# Patient Record
Sex: Male | Born: 1935 | Race: White | Hispanic: No | Marital: Single | Smoking: Former smoker
Health system: Southern US, Community
[De-identification: ages and names within clinical notes are randomized; demographics above are authoritative.]

## PROBLEM LIST (undated history)

## (undated) DIAGNOSIS — I639 Cerebral infarction, unspecified: Secondary | ICD-10-CM

## (undated) DIAGNOSIS — M109 Gout, unspecified: Secondary | ICD-10-CM

## (undated) DIAGNOSIS — E785 Hyperlipidemia, unspecified: Secondary | ICD-10-CM

## (undated) DIAGNOSIS — N183 Chronic kidney disease, stage 3 unspecified: Secondary | ICD-10-CM

## (undated) DIAGNOSIS — R4189 Other symptoms and signs involving cognitive functions and awareness: Secondary | ICD-10-CM

## (undated) DIAGNOSIS — I10 Essential (primary) hypertension: Secondary | ICD-10-CM

## (undated) DIAGNOSIS — R0600 Dyspnea, unspecified: Secondary | ICD-10-CM

## (undated) DIAGNOSIS — I2510 Atherosclerotic heart disease of native coronary artery without angina pectoris: Secondary | ICD-10-CM

## (undated) DIAGNOSIS — N400 Enlarged prostate without lower urinary tract symptoms: Secondary | ICD-10-CM

## (undated) DIAGNOSIS — R0609 Other forms of dyspnea: Secondary | ICD-10-CM

## (undated) DIAGNOSIS — Z960 Presence of urogenital implants: Secondary | ICD-10-CM

## (undated) DIAGNOSIS — I4891 Unspecified atrial fibrillation: Secondary | ICD-10-CM

## (undated) DIAGNOSIS — I5032 Chronic diastolic (congestive) heart failure: Secondary | ICD-10-CM

## (undated) DIAGNOSIS — Z978 Presence of other specified devices: Secondary | ICD-10-CM

## (undated) HISTORY — DX: Atherosclerotic heart disease of native coronary artery without angina pectoris: I25.10

## (undated) HISTORY — DX: Other symptoms and signs involving cognitive functions and awareness: R41.89

## (undated) HISTORY — DX: Essential (primary) hypertension: I10

## (undated) HISTORY — PX: HEMORRHOID SURGERY: SHX153

## (undated) HISTORY — DX: Dyspnea, unspecified: R06.00

## (undated) HISTORY — DX: Hyperlipidemia, unspecified: E78.5

## (undated) HISTORY — DX: Other forms of dyspnea: R06.09

---

## 1962-02-08 HISTORY — PX: TONSILLECTOMY: SUR1361

## 1992-02-09 HISTORY — PX: APPENDECTOMY: SHX54

## 1993-02-08 HISTORY — PX: EXCISION MORTON'S NEUROMA: SHX5013

## 1998-01-28 ENCOUNTER — Encounter: Payer: Self-pay | Admitting: *Deleted

## 1998-01-28 ENCOUNTER — Encounter: Payer: Self-pay | Admitting: Internal Medicine

## 1998-01-28 ENCOUNTER — Inpatient Hospital Stay (HOSPITAL_COMMUNITY): Admission: EM | Admit: 1998-01-28 | Discharge: 1998-02-05 | Payer: Self-pay | Admitting: Internal Medicine

## 1998-01-29 ENCOUNTER — Encounter: Payer: Self-pay | Admitting: Internal Medicine

## 1998-02-03 ENCOUNTER — Encounter: Payer: Self-pay | Admitting: Internal Medicine

## 1998-05-02 ENCOUNTER — Ambulatory Visit (HOSPITAL_COMMUNITY): Admission: RE | Admit: 1998-05-02 | Discharge: 1998-05-02 | Payer: Self-pay | Admitting: Neurological Surgery

## 1998-05-02 ENCOUNTER — Encounter: Payer: Self-pay | Admitting: Neurological Surgery

## 2001-08-31 ENCOUNTER — Encounter: Payer: Self-pay | Admitting: Emergency Medicine

## 2001-08-31 ENCOUNTER — Emergency Department (HOSPITAL_COMMUNITY): Admission: EM | Admit: 2001-08-31 | Discharge: 2001-08-31 | Payer: Self-pay | Admitting: Emergency Medicine

## 2002-02-14 ENCOUNTER — Inpatient Hospital Stay (HOSPITAL_COMMUNITY): Admission: EM | Admit: 2002-02-14 | Discharge: 2002-02-15 | Payer: Self-pay | Admitting: Emergency Medicine

## 2002-02-15 ENCOUNTER — Encounter: Payer: Self-pay | Admitting: Cardiology

## 2002-02-15 ENCOUNTER — Encounter: Payer: Self-pay | Admitting: Emergency Medicine

## 2002-12-19 ENCOUNTER — Ambulatory Visit (HOSPITAL_COMMUNITY): Admission: RE | Admit: 2002-12-19 | Discharge: 2002-12-19 | Payer: Self-pay | Admitting: Gastroenterology

## 2004-10-02 ENCOUNTER — Ambulatory Visit: Payer: Self-pay | Admitting: Physical Medicine & Rehabilitation

## 2004-10-02 ENCOUNTER — Inpatient Hospital Stay (HOSPITAL_COMMUNITY): Admission: EM | Admit: 2004-10-02 | Discharge: 2004-10-07 | Payer: Self-pay | Admitting: Emergency Medicine

## 2004-10-05 ENCOUNTER — Encounter: Payer: Self-pay | Admitting: Cardiology

## 2004-10-07 ENCOUNTER — Inpatient Hospital Stay
Admission: RE | Admit: 2004-10-07 | Discharge: 2004-10-14 | Payer: Self-pay | Admitting: Physical Medicine & Rehabilitation

## 2004-10-10 ENCOUNTER — Encounter (HOSPITAL_COMMUNITY)
Admission: RE | Admit: 2004-10-10 | Discharge: 2004-10-13 | Payer: Self-pay | Admitting: Physical Medicine & Rehabilitation

## 2004-10-21 ENCOUNTER — Encounter
Admission: RE | Admit: 2004-10-21 | Discharge: 2005-01-19 | Payer: Self-pay | Admitting: Physical Medicine & Rehabilitation

## 2004-11-20 ENCOUNTER — Ambulatory Visit: Payer: Self-pay | Admitting: Physical Medicine & Rehabilitation

## 2004-11-20 ENCOUNTER — Encounter
Admission: RE | Admit: 2004-11-20 | Discharge: 2005-02-18 | Payer: Self-pay | Admitting: Physical Medicine & Rehabilitation

## 2005-02-18 ENCOUNTER — Ambulatory Visit: Payer: Self-pay | Admitting: Physical Medicine & Rehabilitation

## 2005-02-18 ENCOUNTER — Encounter
Admission: RE | Admit: 2005-02-18 | Discharge: 2005-05-19 | Payer: Self-pay | Admitting: Physical Medicine & Rehabilitation

## 2006-01-12 ENCOUNTER — Inpatient Hospital Stay (HOSPITAL_COMMUNITY): Admission: AD | Admit: 2006-01-12 | Discharge: 2006-01-15 | Payer: Self-pay | Admitting: Cardiology

## 2006-02-08 HISTORY — PX: CATARACT EXTRACTION: SUR2

## 2007-12-19 HISTORY — PX: US ECHOCARDIOGRAPHY: HXRAD669

## 2008-01-11 HISTORY — PX: CARDIOVASCULAR STRESS TEST: SHX262

## 2009-09-26 ENCOUNTER — Ambulatory Visit: Payer: Self-pay | Admitting: Cardiology

## 2009-10-27 ENCOUNTER — Ambulatory Visit: Payer: Self-pay | Admitting: Cardiovascular Disease

## 2009-11-20 ENCOUNTER — Ambulatory Visit: Payer: Self-pay | Admitting: Cardiology

## 2009-12-22 ENCOUNTER — Ambulatory Visit: Payer: Self-pay | Admitting: Cardiology

## 2010-01-20 ENCOUNTER — Ambulatory Visit: Payer: Self-pay | Admitting: Cardiology

## 2010-02-20 ENCOUNTER — Ambulatory Visit: Payer: Self-pay | Admitting: Cardiology

## 2010-03-23 ENCOUNTER — Encounter (INDEPENDENT_AMBULATORY_CARE_PROVIDER_SITE_OTHER): Payer: Medicare Other

## 2010-03-23 DIAGNOSIS — Z7901 Long term (current) use of anticoagulants: Secondary | ICD-10-CM

## 2010-03-23 DIAGNOSIS — I4891 Unspecified atrial fibrillation: Secondary | ICD-10-CM

## 2010-03-24 ENCOUNTER — Emergency Department (HOSPITAL_COMMUNITY): Payer: Medicare Other

## 2010-03-24 ENCOUNTER — Encounter (HOSPITAL_COMMUNITY): Payer: Self-pay | Admitting: Radiology

## 2010-03-24 ENCOUNTER — Observation Stay (HOSPITAL_COMMUNITY)
Admission: EM | Admit: 2010-03-24 | Discharge: 2010-03-25 | Disposition: A | Discharge status: Home / Self Care | Payer: Medicare Other | Attending: Internal Medicine | Admitting: Internal Medicine

## 2010-03-24 DIAGNOSIS — M79609 Pain in unspecified limb: Secondary | ICD-10-CM

## 2010-03-24 DIAGNOSIS — M1710 Unilateral primary osteoarthritis, unspecified knee: Secondary | ICD-10-CM | POA: Insufficient documentation

## 2010-03-24 DIAGNOSIS — Z7902 Long term (current) use of antithrombotics/antiplatelets: Secondary | ICD-10-CM | POA: Insufficient documentation

## 2010-03-24 DIAGNOSIS — I252 Old myocardial infarction: Secondary | ICD-10-CM | POA: Insufficient documentation

## 2010-03-24 DIAGNOSIS — H3400 Transient retinal artery occlusion, unspecified eye: Principal | ICD-10-CM | POA: Insufficient documentation

## 2010-03-24 DIAGNOSIS — I69922 Dysarthria following unspecified cerebrovascular disease: Secondary | ICD-10-CM | POA: Insufficient documentation

## 2010-03-24 DIAGNOSIS — Z7901 Long term (current) use of anticoagulants: Secondary | ICD-10-CM | POA: Insufficient documentation

## 2010-03-24 DIAGNOSIS — I519 Heart disease, unspecified: Secondary | ICD-10-CM | POA: Insufficient documentation

## 2010-03-24 DIAGNOSIS — Z79899 Other long term (current) drug therapy: Secondary | ICD-10-CM | POA: Insufficient documentation

## 2010-03-24 DIAGNOSIS — I4891 Unspecified atrial fibrillation: Secondary | ICD-10-CM | POA: Insufficient documentation

## 2010-03-24 DIAGNOSIS — Z7982 Long term (current) use of aspirin: Secondary | ICD-10-CM | POA: Insufficient documentation

## 2010-03-24 HISTORY — DX: Cerebral infarction, unspecified: I63.9

## 2010-03-24 HISTORY — DX: Unspecified atrial fibrillation: I48.91

## 2010-03-24 LAB — CBC
HCT: 44 % (ref 39.0–52.0)
Hemoglobin: 15.8 g/dL (ref 13.0–17.0)
MCHC: 35.9 g/dL (ref 30.0–36.0)
Platelets: 132 10*3/uL — ABNORMAL LOW (ref 150–400)
RDW: 13.5 % (ref 11.5–15.5)
WBC: 8 10*3/uL (ref 4.0–10.5)

## 2010-03-24 LAB — BASIC METABOLIC PANEL
BUN: 18 mg/dL (ref 6–23)
CO2: 25 mEq/L (ref 19–32)
Calcium: 10.3 mg/dL (ref 8.4–10.5)
Chloride: 104 mEq/L (ref 96–112)
GFR calc Af Amer: 60 mL/min — ABNORMAL LOW (ref >60)
Potassium: 3.6 mEq/L (ref 3.5–5.1)
Sodium: 139 mEq/L (ref 135–145)

## 2010-03-24 LAB — POCT CARDIAC MARKERS
CKMB, poc: 1.6 ng/mL (ref 1.0–8.0)
CKMB, poc: <1 ng/mL — ABNORMAL LOW (ref 1.0–8.0)
Myoglobin, poc: 176 ng/mL (ref 12–200)
Troponin i, poc: <0.05 ng/mL (ref 0.00–0.09)

## 2010-03-24 LAB — COMPREHENSIVE METABOLIC PANEL
AST: 24 U/L (ref 0–37)
Albumin: 3.8 g/dL (ref 3.5–5.2)
Chloride: 101 mEq/L (ref 96–112)
Creatinine, Ser: 1.36 mg/dL (ref 0.4–1.5)
GFR calc Af Amer: >60 mL/min (ref >60)
Total Bilirubin: 0.8 mg/dL (ref 0.3–1.2)
Total Protein: 6.5 g/dL (ref 6.0–8.3)

## 2010-03-24 LAB — CK TOTAL AND CKMB (NOT AT ARMC)
CK, MB: 2.6 ng/mL (ref 0.3–4.0)
Relative Index: 2.1 (ref 0.0–2.5)

## 2010-03-24 LAB — DIFFERENTIAL
Eosinophils Relative: 2 % (ref 0–5)
Lymphocytes Relative: 26 % (ref 12–46)
Monocytes Relative: 11 % (ref 3–12)

## 2010-03-24 LAB — URINALYSIS, ROUTINE W REFLEX MICROSCOPIC
Bilirubin Urine: NEGATIVE
Ketones, ur: NEGATIVE mg/dL
Urine Glucose, Fasting: NEGATIVE mg/dL
pH: 8 (ref 5.0–8.0)

## 2010-03-25 ENCOUNTER — Observation Stay (HOSPITAL_COMMUNITY): Payer: Medicare Other

## 2010-03-25 LAB — LIPID PANEL
Cholesterol: 107 mg/dL (ref 0–200)
HDL: 31 mg/dL — ABNORMAL LOW (ref >39)
LDL Cholesterol: 53 mg/dL (ref 0–99)
Triglycerides: 117 mg/dL (ref <150)

## 2010-03-25 LAB — PROTIME-INR: INR: 2.23 — ABNORMAL HIGH (ref 0.00–1.49)

## 2010-03-30 NOTE — Consult Note (Signed)
NAME:  [name], [name] NO.:  [id number]  MEDICAL RECORD NO.:  [id number]           PATIENT TYPE:  I  LOCATION:  3022                         FACILITY:  [facility]  PHYSICIAN:  Marlan Palau, M.D.  DATE OF BIRTH:  06-08-1925  DATE OF CONSULTATION:  03/24/2010 DATE OF DISCHARGE:                                CONSULTATION   HISTORY OF PRESENT ILLNESS:  [name] is an 77 year old right- handed white male born 1925/07/20, with a history of atrial fibrillation and cerebrovascular disease, hypertension and dyslipidemia. This patient comes into the emergency room tonight for an evaluation of an event that occurred at 10:30 a.m. today.  The patient was with his wife in the time and suddenly noted onset of left leg pain that was quite severe.  The patient around the same time experienced left-sided headache and shortly thereafter, wife noted that it appeared as if his left eyelid was swelling.  The patient has improved significantly since that time and the eyelid swelling has gone down.  The patient, however, still has some discomfort in the left leg when he moves the leg.  The patient no longer has a headache.  CT scan of the brain was done and was unremarkable for acute changes, but the patient has evidence of old ischemia involving the right cerebellum and left parietal area. Neurology was asked to see this patient for further evaluation of a possible TIA.  The patient reports no true numbness per se or weakness on the extremities and denies any visual field changes.  The patient has an aphasia at baseline and this has not changed.  PAST MEDICAL HISTORY:  Significant for: 1. History of atrial fibrillation. 2. History of cerebrovascular disease. 3. Hypertension. 4. Dyslipidemia. 5. Benign prostate enlargement. 6. Appendectomy. 7. Bilateral cataract surgery. 8. Morton neuroma surgery. 9. History of myocardial infarction. 10.History of hemorrhoid  surgery.  ALLERGIES:  The patient has allergy to LOTENSIN which causes nausea.  MEDICATIONS:  At this time include: 1. Aspirin 81 mg daily. 2. Digoxin 0.125 mg daily. 3. The patient is on finasteride 5 mg daily. 4. Furosemide 20 mg 2 in the morning, one in the afternoon. 5. Hydrochlorothiazide with lisinopril 20/12.5 mg tablets one in the     morning and then lisinopril alone 20 mg in the evening. 6. Metoprolol 200 mg tablet one half tablet daily. 7. Oxybutynin 5 mg every other day rather. 8. Simvastatin 40 mg one half tablet daily. 9. Coumadin tablets 5 mg.  The patient does not smoke or drink.  SOCIAL HISTORY:  This patient is married, [place].  The patient has 2 children who are alive and well.  FAMILY MEDICAL HISTORY:  Notable that the mother died of "old age." Father died with heart disease.  One brother died with cancer.  The patient has 2 sisters, one died with heart disease and one died with cancer.  REVIEW OF SYSTEMS:  Notable for no recent fevers or chills.  The patient did note headache today on the left side.  Denies neck pain and back pain.  Does have some left leg pain.  Denies chest pains, does have chronic shortness of breath.  Denies any nausea, vomiting or troubles controlling bowels or bladder.  PHYSICAL EXAMINATION:  VITALS:  Blood pressure is 145/87, heart rate 91, respiratory rate 19, temperature afebrile. GENERAL:  The patient is a minimally obese white male who is alert and cooperative at the time of examination. HEENT:  Head is atraumatic. EYES:  Pupils are equal, round and reactive to light. NECK:  Supple.  No carotid bruits noted. RESPIRATORY:  Clear. CARDIOVASCULAR:  An occasionally irregular heart rhythm without obvious murmurs or rubs noted. EXTREMITIES:  Notable for 1+ edema at the ankles bilaterally. NEUROLOGIC:  Cranial nerves as above.  Facial symmetry is not present. There is some depression  of the right nasolabial fold.  The patient has full extraocular movements.  Visual fields are full.  Speech is aphasic, not dysarthric.  The patient has good strength in all 4s.  Good symmetric motor tone is noted throughout.  No drift is seen on the arms or legs, but the patient drops the left leg after a few seconds because of pain.  The patient has good finger-nose-finger and heel-to-shin bilaterally.  Gait was not tested.  Pinprick sensation is relatively symmetric throughout as is vibratory sensation, but the patient cannot perceive vibration until he get up to the knees on both sides.  The patient has extinction with double simultaneous stimulation to the left arm.  The right not present in the legs or the face.  Again, deep tendon reflexes are symmetric.  Toes downgoing.  NIH stroke scale score is 5.  LABORATORY VALUES:  Notable for a white count of 8.0, hemoglobin 15.8, hematocrit 44.0, MCV 90.9, platelets of 132, INR is 2.28.  Sodium 139, potassium 3.6, chloride of 104, CO2 25, glucose of 100, BUN of 18, creatinine 1.38, calcium is 10.3, troponin-I 0.05.  Urinalysis reveals specific gravity of 0.011, pH of 8.0, otherwise unremarkable.  Digoxin level was 0.4.  IMPRESSION: 1. New onset of left headache and left leg pain, etiology unclear. 2. Atrial fibrillation. 3. Hypertension. 4. Dyslipidemia.  This patient clearly has risk factors for stroke, but he is therapeutic on his Coumadin and also takes low-dose aspirin.  No adjustment to medications are recommended at this time.  The etiology of the headache and left leg pain is not clear at all and it is not clear that this patient has had an actual TIA or stroke.  At any rate, I would pursue a MRI of the brain and MRA of the brain and get a carotid Doppler study.  I will follow the patient's clinical course while in-house.  Thank you very much.     Marlan Palau, M.D.     CKW/MEDQ  D:  03/24/2010  T:  03/25/2010   Job:  [id number]  cc:   Cassell Clement, M.D.  Electronically Signed by Thana Farr M.D. on 03/30/2010 08:17:37 AM

## 2010-04-04 NOTE — Discharge Summary (Signed)
[name], [name] NO.:  [id number]  MEDICAL RECORD NO.:  [id number]           PATIENT TYPE:  I  LOCATION:  3022                         FACILITY:  [facility]  PHYSICIAN:  Isidor Holts, M.D.  DATE OF BIRTH:  [birth year]  DATE OF ADMISSION:  03/24/2010 DATE OF DISCHARGE:  03/25/2010                              DISCHARGE SUMMARY   PRIMARY MD/PRIMARY CARDIOLOGIST:  Cassell Clement, MD  PRIMARY NEUROLOGIST:  Pramod P. Pearlean Brownie, MD  DISCHARGE DIAGNOSES: 1. Left eye amaurosis fugax. 2. Osteoarthritis of left knee/pain. 3. Chronic atrial fibrillation. 4. Chronic anticoagulation. 5. History of chronic diastolic dysfunction. 6. Status post previous myocardial infarction. 7. Status post previous cerebrovascular accident - dysarthria.  DISCHARGE MEDICATIONS: 1. Aspirin 81 mg p.o. daily. 2. Digoxin 125 mcg p.o. alternate days. 3. Finasteride 5 mg p.o. daily. 4. Furosemide 40 mg p.o. q.a.m. and 20 mg p.o. at 4:00 p.m. daily. 5. Lisinopril 20 mg p.o. at bedtime. 6. Lisinopril/hydrochlorothiazide (20/12.5) 1 tablet p.o. q.a.m. 7. Oxybutynin 5 mg p.o. alternate days in the afternoon. 8. Simvastatin 20 mg p.o. at bedtime. 9. Toprol-XL 200 mg tablets 1-2 tablets p.o. at bedtime. 10.Coumadin 7.5 mg p.o. at 6:00 p.m. on Mondays, Wednesdays, and     Fridays and 10 mg 6 p.m. p.o. on Tuesdays, Thursdays, Saturdays,     and Sundays.  PROCEDURES: 1. Chest x-ray, March 24, 2010, this showed cardiomegaly without     evidence of acute cardiopulmonary disease. 2. Head CT scan, March 24, 2010, this showed no evidence of acute     intracranial abnormality.  There were remote infarcts including     chronic small vessel white matter ischemic changes. 3. Brain MRI, March 25, 2010, this showed no acute or reversible     findings.  There was extensive chronic ischemic changes throughout     the brain. 4. Brain MRA, March 25, 2010, this showed no major vessel occlusion   or correctable proximal stenosis.  There was mild atherosclerotic     irregularity of the more distal branch vessels diffusely. 5. Bilateral lower extremity venous duplex, March 24, 2010, this     showed no evidence of DVT or superficial thrombosis bilaterally.  CONSULTATIONS:  Dr. Lesia Sago, neurologist.  ADMISSION HISTORY:  As in H and P notes of March 24, 2010, dictated by Dr. Calvert Cantor.  However, in brief, this is an 77 year old male, with known history of atrial fibrillation on chronic anticoagulation, history of chronic diastolic dysfunction, coronary artery disease status post previous MI, dyslipidemia, status post previous CVA/dysarthria, BPH, status post appendectomy, status post bilateral cataract surgery, history of Morton neuroma surgery, history of hemorrhoid surgery, presenting with transient visual disturbance of the left eye lasting approximately 30 minutes.  In addition, the patient has had left knee pain for several weeks, which apparently appears to be getting worse.  On suspicion of possible TIA/amaurosis fugax, the patient was admitted for further evaluation, investigation, and management.  CLINICAL COURSE: 1. TIA/left eye amaurosis fugax.  For details of presentation, refer     to admission history.  The patient was still asymptomatic at the  time of evaluation by the medical team.  He was placed on     telemetric monitoring, regular neuro checks.  CVA/TIA workup was     carried out.  For details of findings, refer to procedure list     above.  He had no recurrence of symptoms and was at his baseline as     of a.m. of March 25, 2010.  Neurology consultation was kindly     provided by Dr. Lesia Sago who has recommended continued low-dose     aspirin and Coumadin.  2. Atrial fibrillation.  The patient was in rate control, during the     course of this hospitalization.  He continues on digoxin and beta-     blocker in preadmission dosage.   Anticoagulation was supervised by     clinical pharmacologist and INR remained therapeutic at between     2.28 and 2.23.  3. Dyslipidemia.  The patient is on statin treatment which we have     continued.  Lipid profile was as follows:  Total cholesterol 107,     triglyceride 117, HDL 31, and LDL 53, i.e. excellent lipid profile.     He has been reassured accordingly.  4. History of diastolic dysfunction.  The patient had no problems     referable to this.  He had no clinical congestive cardiac failure.  5. Left knee pain.  This is likely secondary to osteoarthritis.  The     patient does indeed have generalized osteoarthritic changes and     experienced pain on flexion and extension of the left knee as well     as on weightbearing.  Fortunately, he does have an orthopedic     appointment scheduled for March 26, 2010.  Therefore, I asked     him to keep this appointment.  DISPOSITION:  The patient was considered clinically stable for discharge on March 25, 2010.  He was therefore discharged accordingly.  ACTIVITY:  As tolerated.  Recommended to increase activity slowly.  DIET:  Heart healthy.  FOLLOWUP INSTRUCTIONS:  The patient is to follow up routinely with his primary MD/cardiologist, Dr. Cassell Clement, per prior scheduled appointment.  He is also to keep his regular scheduled appointment with Dr. [facility] as well as his regular appointment with Dr. Delia Heady, his primary neurologist.  Of note, the patient does have an appointment with his orthopedic surgeon on March 26, 2010.  He has been urged to keep this appointment.  All this has been communicated to the patient and spouse.  They verbalized understanding.     Isidor Holts, M.D.     CO/MEDQ  D:  03/25/2010  T:  03/26/2010  Job:  [id number]  cc:   Cassell Clement, M.D. Pramod P. Pearlean Brownie, MD  Electronically Signed by Isidor Holts M.D. on 04/04/2010 12:26:10 PM

## 2010-04-07 NOTE — H&P (Signed)
NAME:  [name], [name] NO.:  [id number]  MEDICAL RECORD NO.:  [id number]           PATIENT TYPE:  E  LOCATION:  [facility]                         FACILITY:  [facility]  PHYSICIAN:  Calvert Cantor, M.D.     DATE OF BIRTH:  10-18-1925  DATE OF ADMISSION:  03/24/2010 DATE OF DISCHARGE:                             HISTORY & PHYSICAL   PRIMARY CARE PHYSICIAN:  Cassell Clement, MD  PRESENTING COMPLAINT:  Visual disturbance.  HISTORY OF PRESENT ILLNESS:  This is an 77 year old male who has a significant amount of dysarthria from a prior CVA.  The patient also has AFib, CHF and hyperlipidemia.  The patient is trying to explain to me that he had some sort of visual disturbance with his left eye, which lasted about 30 minutes.  When I asked him if he lost his vision, he says not completely.  It is very difficult to obtain further history from him, but he does state that that had resolved by the time he came to the ER.  The patient has had history of 4 strokes in the past.  He is currently on aspirin and Coumadin and his INR is therapeutic.  The patient also complained of severe pain in his left leg today this is not new.  This has been going on for a while and his wife has made an appointment with his orthopedic surgeon this coming Thursday for this pain.  PAST MEDICAL HISTORY: 1. CVA's with a resultant dysarthria. 2. Congestive heart failure, unspecified. 3. Atrial fibrillation. 4. Hyperlipidemia. 5. MI.  SOCIAL HISTORY:  He does not smoke or drink.  Lives with his wife.  ALLERGIES:  No known drug allergies.  HOME MEDICATIONS:  Med rec is not yet complete.  Per ER list he is on the following: 1. Aspirin. 2. Digoxin. 3. Finasteride. 4. Furosemide. 5. Hydrochlorothiazide. 6. Lisinopril. 7. Metoprolol. 8. Oxybutynin. 9. Simvastatin. 10.Warfarin  PAST SURGICAL HISTORY:  Appendectomy as a child, hemorrhoid surgery, removal of a Morton's neuroma from his  foot.  Last echo reveals a diastolic dysfunction.  This has not been graded, EF is 55-65%.  PHYSICAL EXAMINATION:  GENERAL:  Elderly man sitting up in bed in no acute distress. VITAL SIGNS:  Blood pressure 151/85, pulse 90, respiratory rate 20, temperature 97.6, pulse ox 100% on room air. HEENT:  Pupils equal, round and reactive to light.  Extraocular movements are intact.  Conjunctivae is pink.  No scleral icterus.  Oral mucosa moist.  Oropharynx clear. NECK:  Supple.  No thyromegaly, lymphadenopathy, carotid bruits. HEART:  Irregular rate and rhythm.  No murmurs, rubs or gallops. LUNGS:  Clear bilaterally.  Normal respiratory effort.  No use of accessory muscles.  ABDOMEN:  Obese, soft, nontender, nondistended. Bowel sounds positive. EXTREMITIES:  No cyanosis, clubbing or edema.  Pedal pulses are positive.  He does have some swelling on the medial aspect of his left knee.  There is tenderness present as well.  There is no warmth or erythema to suggest an infection. MUSCULOSKELETAL:  Range of motion of the left knee, when flexing hisknee he has pain that radiates from the medial  aspect of the knee down his shin, which restricts his movement. SKIN:  Warm, dry, a few bruises. No rash.  Blood work:  INR is therapeutic at 2.28.  BNP is slightly elevated at 249.  Digoxin level is 0.4, platelet count is low at 132.  Cardiac enzymes are negative x2 sets.  CT scan of the head performed without contrast reveals a remote infarcts and chronic small vessel white matter ischemic changes.  No evidence of acute infarct.  ASSESSMENT AND PLAN: 1. Probable transient ischemic attack.  We will get an MRI, MRA and     carotid Dopplers since he is already on aspirin and Coumadin.  No     further anticoagulation will be added.  I have discussed this with     Dr. Anne Hahn as well who will see the patient in consult.  We will     monitor him on telemetry and do neuro checks. 2. Left knee pain.   Outpatient followup with his orthopedic surgeon. 3. Atrial fibrillation. 4. Hyperlipidemia. 5. Chronic diastolic dysfunction.  Time on admission 60 minutes.     Calvert Cantor, M.D.     SR/MEDQ  D:  03/24/2010  T:  03/24/2010  Job:  [id number]  cc:   Cassell Clement, M.D.  Electronically Signed by Calvert Cantor M.D. on 04/07/2010 08:19:23 PM

## 2010-04-20 ENCOUNTER — Encounter (INDEPENDENT_AMBULATORY_CARE_PROVIDER_SITE_OTHER): Payer: Medicare Other

## 2010-04-20 DIAGNOSIS — Z7901 Long term (current) use of anticoagulants: Secondary | ICD-10-CM

## 2010-04-20 DIAGNOSIS — I4891 Unspecified atrial fibrillation: Secondary | ICD-10-CM

## 2010-05-04 ENCOUNTER — Other Ambulatory Visit (INDEPENDENT_AMBULATORY_CARE_PROVIDER_SITE_OTHER): Payer: Medicare Other | Admitting: *Deleted

## 2010-05-04 ENCOUNTER — Other Ambulatory Visit: Payer: Self-pay | Admitting: Cardiology

## 2010-05-04 DIAGNOSIS — E7800 Pure hypercholesterolemia, unspecified: Secondary | ICD-10-CM

## 2010-05-04 DIAGNOSIS — R0789 Other chest pain: Secondary | ICD-10-CM

## 2010-05-05 LAB — CBC WITH DIFFERENTIAL/PLATELET
Basophils Absolute: 0 10*3/uL (ref 0.0–0.1)
Basophils Relative: 0 % (ref 0–1)
Eosinophils Absolute: 0.1 10*3/uL (ref 0.0–0.7)
Eosinophils Relative: 2 % (ref 0–5)
HCT: 43.9 % (ref 39.0–52.0)
MCH: 32.5 pg (ref 26.0–34.0)
MCHC: 34.2 g/dL (ref 30.0–36.0)
MCV: 95 fL (ref 78.0–100.0)
Monocytes Absolute: 0.5 10*3/uL (ref 0.1–1.0)
Platelets: 141 10*3/uL — ABNORMAL LOW (ref 150–400)
RDW: 14.6 % (ref 11.5–15.5)
WBC: 7.3 10*3/uL (ref 4.0–10.5)

## 2010-05-05 LAB — COMPREHENSIVE METABOLIC PANEL
ALT: 17 U/L (ref 0–53)
AST: 18 U/L (ref 0–37)
BUN: 27 mg/dL — ABNORMAL HIGH (ref 6–23)
Calcium: 10.3 mg/dL (ref 8.4–10.5)
Creat: 1.21 mg/dL (ref 0.40–1.50)
Total Bilirubin: 0.6 mg/dL (ref 0.3–1.2)

## 2010-05-05 LAB — LIPID PANEL
HDL: 33 mg/dL — ABNORMAL LOW (ref >39)
Total CHOL/HDL Ratio: 4 Ratio
VLDL: 25 mg/dL (ref 0–40)

## 2010-05-08 ENCOUNTER — Telehealth: Payer: Self-pay | Admitting: *Deleted

## 2010-05-08 NOTE — Telephone Encounter (Signed)
Advised wife of labs 

## 2010-05-08 NOTE — Telephone Encounter (Signed)
Message copied by Regis Bill on Fri May 08, 2010  9:55 AM ------      Message from: Cassell Clement      Created: Tue May 05, 2010  9:34 AM       Lab satisfactory.  Discuss further an office visit.

## 2010-05-08 NOTE — Progress Notes (Signed)
Advised wife of labs 

## 2010-06-08 ENCOUNTER — Ambulatory Visit (INDEPENDENT_AMBULATORY_CARE_PROVIDER_SITE_OTHER): Payer: Medicare Other | Admitting: Cardiology

## 2010-06-08 ENCOUNTER — Ambulatory Visit (INDEPENDENT_AMBULATORY_CARE_PROVIDER_SITE_OTHER): Payer: Medicare Other | Admitting: *Deleted

## 2010-06-08 ENCOUNTER — Encounter: Payer: Self-pay | Admitting: Cardiology

## 2010-06-08 DIAGNOSIS — I350 Nonrheumatic aortic (valve) stenosis: Secondary | ICD-10-CM | POA: Insufficient documentation

## 2010-06-08 DIAGNOSIS — I509 Heart failure, unspecified: Secondary | ICD-10-CM

## 2010-06-08 DIAGNOSIS — I119 Hypertensive heart disease without heart failure: Secondary | ICD-10-CM

## 2010-06-08 DIAGNOSIS — I639 Cerebral infarction, unspecified: Secondary | ICD-10-CM

## 2010-06-08 DIAGNOSIS — E7800 Pure hypercholesterolemia, unspecified: Secondary | ICD-10-CM

## 2010-06-08 DIAGNOSIS — I69320 Aphasia following cerebral infarction: Secondary | ICD-10-CM | POA: Insufficient documentation

## 2010-06-08 DIAGNOSIS — I10 Essential (primary) hypertension: Secondary | ICD-10-CM | POA: Insufficient documentation

## 2010-06-08 DIAGNOSIS — I4891 Unspecified atrial fibrillation: Secondary | ICD-10-CM

## 2010-06-08 DIAGNOSIS — I359 Nonrheumatic aortic valve disorder, unspecified: Secondary | ICD-10-CM

## 2010-06-08 DIAGNOSIS — I6340 Cerebral infarction due to embolism of unspecified cerebral artery: Secondary | ICD-10-CM

## 2010-06-08 DIAGNOSIS — N400 Enlarged prostate without lower urinary tract symptoms: Secondary | ICD-10-CM

## 2010-06-08 DIAGNOSIS — I4821 Permanent atrial fibrillation: Secondary | ICD-10-CM | POA: Insufficient documentation

## 2010-06-08 DIAGNOSIS — I5032 Chronic diastolic (congestive) heart failure: Secondary | ICD-10-CM

## 2010-06-08 DIAGNOSIS — Z7901 Long term (current) use of anticoagulants: Secondary | ICD-10-CM

## 2010-06-08 DIAGNOSIS — I4510 Unspecified right bundle-branch block: Secondary | ICD-10-CM

## 2010-06-08 LAB — POCT INR: INR: 2.5

## 2010-06-08 NOTE — Assessment & Plan Note (Signed)
No orthopnea or paroxysmal nocturnal dyspnea or ankle edema.

## 2010-06-08 NOTE — Assessment & Plan Note (Signed)
This patient has a past history of essential hypertension and a past history of diastolic congestive heart failure.  Since last visit he has been doing well.  He has not been experiencing any chest pain or increased shortness of breath.  He's not having any headaches dizziness or syncope.  He has not been expressing any significant peripheral edema.

## 2010-06-08 NOTE — Progress Notes (Signed)
[name] Date of Birth:  04-21-1925 [facility] [address]   [address] [place], [place]  [address] [phone]           Fax   [phone]  HPI: This pleasant 77 year old married Caucasian gentleman is seen for a scheduled followup office visit.  He has a history of established atrial fibrillation.  He's had a previous embolic stroke with expressive aphasia.  He's had no recurrent strokes and he has remained therapeutic on his Coumadin.  He does not have any history of ischemic heart disease.  He had a normal adenosine Cardiolite stress test on 01/11/08.  He had mild left ventricular systolic dysfunction on that study with apical hypokinesis but no reversible ischemia.  He does have a known chronic right bundle branch block which is asymptomatic.  His last echocardiogram was 12/19/07 and showed an ejection fraction of 55-60% with moderate left atrial enlargement mild aortic stenosis with trivial aortic insufficiency and mild mitral regurgitation and mild tricuspid regurgitation with mild pulmonary hypertension.  His right ventricular systolic pressure was 38.  Current Outpatient Prescriptions  Medication Sig Dispense Refill  . aspirin 81 MG tablet Take 81 mg by mouth daily.        . cholecalciferol (VITAMIN D) 1000 UNITS tablet Take 1,000 Units by mouth daily.        . Coenzyme Q10 (CO Q 10 PO) Take by mouth.        . digoxin (LANOXIN) 0.125 MG tablet Take 125 mcg by mouth daily. Taking every other day       . finasteride (PROSCAR) 5 MG tablet Take 5 mg by mouth daily.        . furosemide (LASIX) 20 MG tablet Take 20 mg by mouth 2 (two) times daily. Taking two bid       . lisinopril (PRINIVIL,ZESTRIL) 20 MG tablet Take 20 mg by mouth daily.        Marland Kitchen lisinopril-hydrochlorothiazide (PRINZIDE,ZESTORETIC) 20-12.5 MG per tablet Take 1 tablet by mouth daily.        . meclizine (ANTIVERT) 32 MG tablet Take 32 mg by mouth 3 (three) times daily as needed. Taking 25  mg prn       . metoprolol succinate (TOPROL-XL) 25 MG 24 hr tablet Take 25 mg by mouth daily. Taking 1/4 daily       . Multiple Vitamin (MULTIVITAMIN PO) Take by mouth.        . nitroGLYCERIN (NITROSTAT) 0.4 MG SL tablet Place 0.4 mg under the tongue every 5 (five) minutes as needed.        Marland Kitchen oxybutynin (DITROPAN) 5 MG tablet Take 5 mg by mouth 3 (three) times daily. Taking every other day       . simvastatin (ZOCOR) 40 MG tablet Take 40 mg by mouth at bedtime. Taking 1/2 daily       . warfarin (COUMADIN) 5 MG tablet Take 5 mg by mouth daily. Take as directed         Allergies  Allergen Reactions  . Lotensin     lethargic    Patient Active Problem List  Diagnoses  . Atrial fibrillation  . Encounter for long-term (current) use of anticoagulants  . Right bundle branch block  . Cerebrovascular accident, embolic  . Aortic stenosis, mild  . Chronic diastolic congestive heart failure  . Hypercholesterolemia  . BPH (benign prostatic hyperplasia)  . Benign hypertensive heart disease without heart failure    History  Smoking status  . Former Smoker  Smokeless tobacco  . Not on file    History  Alcohol Use: Not on file    No family history on file.  Review of Systems: The patient denies any heat or cold intolerance.  No weight gain or weight loss.  The patient denies headaches or blurry vision.  There is no cough or sputum production.  The patient denies dizziness.  There is no hematuria or hematochezia.  The patient denies any muscle aches or arthritis.  The patient denies any rash.  The patient denies frequent falling or instability.  There is no history of depression or anxiety.  All other systems were reviewed and are negative.   Physical Exam: Filed Vitals:   06/08/10 1127  BP: 120/60  Pulse: 64  The general appearance reveals a large elderly gentleman in no acute distress.  He does have mild expressive aphasia.Pupils equal and reactive.   Extraocular Movements are full.   There is no scleral icterus.  The mouth and pharynx are normal.  The neck is supple.  The carotids reveal no bruits.  The jugular venous pressure is normal.  The thyroid is not enlarged.  There is no lymphadenopathy.The chest is clear to percussion and auscultation. There are no rales or rhonchi. Expansion of the chest is symmetrical.The precordium is quiet.  The first heart sound is normal.  The second heart sound is physiologically split.  There is no murmur gallop rub or click.  There is no abnormal lift or heave. The abdomen is soft and nontender. Bowel sounds are normal. The liver and spleen are not enlarged. There Are no abdominal masses. There are no bruits.The pedal pulses are good.  There is no phlebitis or edema.  There is no cyanosis or clubbing.Strength is normal and symmetrical in all extremities.  There is no lateralizing weakness.  There are no sensory deficits.The skin is warm and dry.  There is no rash.    Assessment / Plan: Continue present medication.  Recheck in one month for protime and in 2 months for protime in 3 months for office visit and protime.

## 2010-06-08 NOTE — Assessment & Plan Note (Signed)
The patient has chronic atrial fibrillation and has remained on Coumadin.  His INR was checked recently and was therapeutic at 2.5.Is not having any bleeding problems from the Coumadin.  He has a history of a remote embolic stroke with expressive aphasia several years ago and this has gradually improved and his speech is close to being normal male.

## 2010-06-09 ENCOUNTER — Encounter: Payer: Self-pay | Admitting: Cardiology

## 2010-06-11 ENCOUNTER — Other Ambulatory Visit: Payer: Self-pay | Admitting: *Deleted

## 2010-06-11 DIAGNOSIS — E7800 Pure hypercholesterolemia, unspecified: Secondary | ICD-10-CM

## 2010-06-11 DIAGNOSIS — I10 Essential (primary) hypertension: Secondary | ICD-10-CM

## 2010-06-11 MED ORDER — DIGOXIN 125 MCG PO TABS: 125.0000 ug | ORAL_TABLET | Freq: Every day | ORAL | Status: DC

## 2010-06-11 MED ORDER — LISINOPRIL 20 MG PO TABS: 20.0000 mg | ORAL_TABLET | Freq: Every day | ORAL | Status: DC

## 2010-06-11 MED ORDER — FUROSEMIDE 20 MG PO TABS: 20.0000 mg | ORAL_TABLET | Freq: Two times a day (BID) | ORAL | Status: DC

## 2010-06-11 MED ORDER — PRAVASTATIN SODIUM 40 MG PO TABS: 40.0000 mg | ORAL_TABLET | Freq: Every evening | ORAL | Status: DC

## 2010-06-11 MED ORDER — LISINOPRIL-HYDROCHLOROTHIAZIDE 20-12.5 MG PO TABS: 1.0000 | ORAL_TABLET | Freq: Every day | ORAL | Status: DC

## 2010-06-11 NOTE — Telephone Encounter (Signed)
Called refills to [facility]

## 2010-06-26 NOTE — H&P (Signed)
NAMEREGGINALD, PASK NO.:  [id number]   MEDICAL RECORD NO.:  [id number]          PATIENT TYPE:  INP   LOCATION:  3728                         FACILITY:  [facility]   PHYSICIAN:  Cassell Clement, M.D. DATE OF BIRTH:  04/19/1925   DATE OF ADMISSION:  01/12/2006  DATE OF DISCHARGE:                              HISTORY & PHYSICAL   CHIEF COMPLAINT:  Possible stroke.   HISTORY:  This is a 77 year old married Caucasian male admitted with a  possible recurrent stroke.  The patient has a past history of previous  embolic stroke secondary to atrial fibrillation on 10/02/2004.  He has a  significant residual expressive aphasia from that stroke.  He did spend  time on the rehab unit with Dr. Ellwood Dense following that stroke in  August 2006.  At that time it was a left posterior middle cerebral  artery infarction.  He was also noted to have deep vein thrombosis of  the left upper extremity axillary and basilic veins.  He had a previous  stroke approximately 10 years ago.  The patient has been on long-term  Coumadin anticoagulation.  At the time of his stroke.  Today his INR in  our office was 1.6 and on arrival at [facility] it was 1.8.   The patient has been on the following an medications: Coumadin 5 mg  tablets taking 2 tablets on Wednesday and Sunday and 1 1/2 tablets the  other 5 days, aspirin 81 mg daily, Toprol XL 100 mg twice a day,  lisinopril HCT 20/12.5 half a tablet daily, Lanoxin 0.125 mg daily and  simvastatin 20 mg daily each evening.   The patient was in his usual state of health this morning.  At about  6:30 a.m. he became confused while taking his shower and getting ready  for the morning.  He had difficulty in figuring out how to his tooth  brush and appeared to have some problems with apraxia.  His wife was  advised to bring him to the office and he was seen approximately 6 hours  after the episode a at which time he was still mildly confused and  his  expressive aphasia was about the same as it had been chronically.  He  reported no awareness of any lateralizing muscle weakness or hemiparesis  symptoms.  He did complain of a mild headache.  Of note is the fact that  he does have chronic atrial fibrillation.  His last echocardiogram  03/18/2005 had shown an ejection fraction of 55-60% and he has left  atrial enlargement measuring 53 mm.  He has had a past history of  hypertension and previous cerebellar stroke many years ago.   FAMILY HISTORY:  Reveals that his father died at 73 and had a pacemaker.  Mother died at 68 after abdominal aortic aneurysm surgery.  The patient  has one brother who died of cancer.  He has a sister who died of heart  trouble.   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  He still does some  tax work.  He does not use alcohol  or tobacco.  He is active in his  church.   PAST SURGICAL HISTORY:  Operations include appendectomy as a child and  hemorrhoid surgery.   ALLERGIES:  He has a history of allergy to LOTENSIN but is able to take  lisinopril.   REVIEW OF SYSTEMS:  His review of systems is otherwise noncontributory.  He is had no change in bowel habits, hematochezia or melena.  He has not  been experiencing any new genitourinary symptoms.  He denies any cough  or sputum production.   PHYSICAL EXAMINATION:  On physical examination today, in the office his  blood pressure was 142/80, the pulse is 85 and irregularly irregular.  Respirations are normal, afebrile.  The general appearance reveals a  heavy-set Caucasian gentleman in no acute distress.  He does have  expressive aphasia.  He is moving all extremities.  The pupils were  equal and reactive.  He appears to have possible diplopia when looking  to the right and he acknowledges that he sees two fingers when one is  being held up when looking to the right but not when looking to the  left.  The chest is clear to percussion and  auscultation.  The heart  reveals no gallop or rub.  There is a soft apical systolic murmur.  He  does have a known murmur of mitral regurgitation.  The abdomen is soft  and nontender.  Liver and spleen not enlarged.  Extremities showed no  phlebitis or edema.  Neurologically he has good grip strength  bilaterally and he does have expressive aphasia and questionable  diplopia looking to the right.   LABORATORY DATA:  Labs done in the office include an INR of 1.6.   IMPRESSION:  1. Probable recurrent embolic stroke.  2. Subtherapeutic Coumadin.  3. History of chronic atrial fibrillation.  4. History of hyperlipidemia.  5. History of prior strokes.   DISPOSITION:  We are admitting to telemetry.  We will get a stat CT  brain scan to rule out hemorrhage and ask pharmacy to start IV heparin.  Will also adjust Coumadin per pharmacy protocol until it is therapeutic.  Will request neurology consultation.  Will continue his present  antihypertensive medication and medication for rate control including  Toprol XL and Lanoxin.           ______________________________  Cassell Clement, M.D.     TB/MEDQ  D:  01/12/2006  T:  01/13/2006  Job:  [id number]   cc:   Melvyn Novas, M.D.  Al Decant. Janey Greaser, MD

## 2010-06-26 NOTE — Discharge Summary (Signed)
[name], [name] NO.:  [id number]   MEDICAL RECORD NO.:  [id number]          PATIENT TYPE:  INP   LOCATION:  3728                         FACILITY:  [facility]   PHYSICIAN:  Cassell Clement, M.D. DATE OF BIRTH:  1925/05/10   DATE OF ADMISSION:  01/12/2006  DATE OF DISCHARGE:  01/15/2006                               DISCHARGE SUMMARY   FINAL DIAGNOSES:  1. Probable recurrent embolic stroke.  2. Subtherapeutic Coumadin.  3. Chronic atrial fibrillation.  4. Hyperlipidemia.  5. Essential hypertension   OPERATIONS PERFORMED:  None.   HISTORY:  This is a 77 year old Caucasian gentleman admitted with  possible recurrent stroke.  He has a past history of a previous embolic  stroke secondary to atrial fibrillation on October 02, 2004, and he still  has significant residual expressive aphasia from that stroke.  It was a  left, posterior, middle cerebral artery infarction.  He had a remote  stroke 10 years earlier.  He has been on long-term Coumadin.  But at the  time of his stroke he was seen in our office and his INR was only 1.6,  and on arrival at [facility] it was only 1.8, which is sub-  therapeutic.   He had been on a regimen of:  1. Coumadin 5 mg, taking 2 tablets Wednesday and Sunday, and 1 and 1/2      tablet the other 5 days, as well as a regimen of:  2. Aspirin 81 mg daily.  3. Toprol XL 100 mg twice a day for control of his atrial fib.  4. Lisinopril/HCT 20/12.5, half a tablet daily.  5. Lanoxin 0.125 mg daily.  6. Simvastatin 20 mg daily each night.   He became confused about 6:30 this morning and had problems with  apraxia, could not figure out how to hold his toothbrush and his wife  was advised to bring him to the office and he was seen approximately 6  after the episode, at which time he was still mildly confused but his  expressive aphasia was really unchanged from his chronic problem with  it.  He did complain of a mild headache.  He  has a history of chronic  atrial fib with a normal ejection fraction of 55-60% by previous echo  March 18, 2005.  He does have known left atrial enlargement measuring  53-mm.   PHYSICAL EXAMINATION:  VITAL SIGNS:  On admission showed a blood  pressure of 142/80, pulse was 85 and irregular.  GENERAL:  This is a heavy-set gentleman in no acute distress.  HEART:  Reveals no gallop and he has a soft apical systolic murmur.  NEUROLOGIC:  He has good grip strength bilaterally.  He does have  questionable diplopia, looking to the right.  And, he does have  expressive aphasia   HOSPITAL COURSE:  The patient was admitted.  He was seen in consultation  on day of admission by Dr. Porfirio Mylar Dohmeier, who recommended doing an MRI  with MRA to see if any new areas of ischemic deficit were noted.  The  patient was also started on  IV heparin to cover him until his Coumadin  could be brought back into therapeutic range.  His blood pressure  regimen was continued as an inpatient as was his rate control for his  atrial fib.  The patient had an initial CT brain scan to rule out  hemorrhage and no hemorrhage was found.  The patient had an uneventful  hospital course.  He had no further neurologic spells and his confusion  which was present on admission was improved by the following day.  He  was given evaluation by speech therapy.  He did develop some problems  with hypertension and chest x-ray showed early congestive failure and B  natriuretic peptide was elevated at 844, so he was started on Lasix 20  mg daily.  Speech pathology saw the patient and felt that he had no  further inpatient therapy at this time.  On January 14, 2006, blood  pressure was still running somewhat high and his ACE inhibitor was  increased.  By January 25, 2006, he was back to baseline and was able  to be discharged home.  His INR at discharge was 2.2.   DISCHARGE MEDICINES:  1. Toprol XL 100 mg twice a day.  2. Baby aspirin  daily.  3. Lanoxin 0.125 mg daily.  4. Lasix 20 mg daily.  5. Zocor 20 mg daily.  6. Lisinopril HCT 20/12.5 daily.  7. Coumadin 10 mg daily for the next 2 days and recheck a proTime on      the third day in our office.   ACCESSORY LABORATORY DATA:  The MRI of the brain showed chronic ischemic  changes, particularly of the left parietal lobe and multiple areas of  chronic hemorrhage, likely related to hypertension.  The MRA of the head  showed mild intracranial atherosclerotic disease involving right middle  cerebral artery and right posterior cerebral artery.  His electrolytes  were normal.  BUN was 17, creatinine 1.  Lipids showed cholesterol of  103, LDL of 50, HDL of 43.  Cardiac enzymes were normal.  Hemoglobin A1c  was normal at 5.5.  B natriuretic peptide on admission was 844, and a  followup 1 day prior to discharge was 651.  PSA was normal at 1.7 and  digoxin level was subtherapeutic at 0.2.  Urine culture was negative.   CONDITION ON DISCHARGE:  Improved.           ______________________________  Cassell Clement, M.D.     TB/MEDQ  D:  02/26/2006  T:  02/26/2006  Job:  [id number]   cc:   Al Decant. Janey Greaser, MD  Melvyn Novas, M.D.

## 2010-06-26 NOTE — Consult Note (Signed)
NAME:  URBAN, NAVAL NO.:  [id number]   MEDICAL RECORD NO.:  [id number]          PATIENT TYPE:  INP   LOCATION:  2308                         FACILITY:  [facility]   PHYSICIAN:  Peter M. Swaziland, M.D.  DATE OF BIRTH:  26-Jun-1925   DATE OF CONSULTATION:  10/03/2004  DATE OF DISCHARGE:                                   CONSULTATION   REASON FOR CONSULTATION:  Mr. Balkcom is a 77 year old, white male admitted  yesterday with acute embolic stroke presenting predominantly with an  aphasia.  This followed a severe coughing spell with sudden onset.  The  patient was in sinus rhythm when he presented to the hospital.  He was  treated with lytic therapy last night.  This morning, he developed atrial  fibrillation with rapid ventricular response.  He remains in atrial  fibrillation with a rate of 120-140 despite IV Cardizem and 10 mg per hour.  The patient does have a history of paroxysmal atrial fibrillation diagnosed  incidentally in February 2005.  He was anticoagulated with Coumadin for 2  months, but this was not continued since the patient maintained sinus  rhythm.  He apparently has had a prior CVA and hypertension.  He had  echocardiogram in February 2005, which showed normal left ventricular  function.  He had mitral annular calcification with mild mitral  insufficiency and left atrial enlargement.   PAST MEDICAL HISTORY:  1.  Hypertension.  2.  History of TIA/CVA.  3.  Paroxysmal atrial fibrillation.  4.  Status post appendectomy.  5.  Status post hemorrhoidectomy.   ALLERGIES:  No known drug allergies.   MEDICATIONS:  (Per our records in October 2005)  1.  Lisinopril 20 mg per day.  2.  Simvastatin 40 mg per day.  3.  Hydrochlorothiazide 25 mg per day.  4.  Toprol XL 25 mg b.i.d.  5.  Aspirin 81 mg per day.   SOCIAL HISTORY:  The patient is retired.  He is married and has no children.  He denies tobacco or alcohol use.   FAMILY HISTORY:  His father died at  age 86 and had a pacemaker.  Mother died  at age 39 after abdominal aortic aneurysm surgery.  One brother died with  cancer.  One sister died with heart trouble.   REVIEW OF SYSTEMS:  Otherwise unobtainable.   PHYSICAL EXAMINATION:  GENERAL:  The patient is well-developed, white male  in no apparent distress.  VITAL SIGNS:  Blood pressure 156/74, pulse 120 and irregular, afebrile.  Saturations 97% on 4 L nasal cannula.  NECK:  No JVD or bruits.  LUNGS:  Clear.  CARDIAC:  Reveals a harsh grade 3/6 systolic murmur at the apex without S3.  ABDOMEN:  Soft, nontender.  He has no masses or hepatosplenomegaly.  Femoral  and pedal pulses are 2+ and symmetric.  EXTREMITIES:  No edema.  NEUROLOGIC:  Remarkable for aphasia.  He moves all extremities well and does  follow commands.   LABORATORY DATA AND X-RAY FINDINGS:  Chest x-ray shows cardiomegaly with  atelectasis.  ECG showed normal sinus rhythm with  left anterior fascicular  block and right bundle branch block which are chronic.   White count 12,300 with hemoglobin 13.3, hematocrit 38.4, platelets 148,000.  Sodium 139, potassium 3.5, chloride 110, CO2 23, BUN 24, creatinine 1.1,  glucose 171.  LFTs are normal.  Urinalysis is normal.   IMPRESSION:  1.  Embolic cerebrovascular accident with aphasia, status post lytic      therapy.  2.  Atrial fibrillation with rapid ventricular response.  3.  Old cerebellar cerebrovascular accident.  4.  Hypertension.  5.  Murmur consistent with mitral insufficiency.   RECOMMENDATIONS:  1.  Will initiate amiodarone therapy IV since the patient's rate is still      poorly controlled on IV Cardizem.  He will be anticoagulated per      neurologic recommendations.  2.  Will obtain baseline thyroid function studies and plan on obtaining an      echocardiogram on Monday.           ______________________________  Peter M. Swaziland, M.D.     PMJ/MEDQ  D:  10/03/2004  T:  10/04/2004  Job:  [id number]    cc:   Santina Evans A. Orlin Hilding, M.D.  Fax: [phone]   Cassell Clement, M.D.  [address], [address]  [place]  [place] [address]  Fax: [phone]   Al Decant. Janey Greaser, MD  [address]  [place]  [place] [address]  Fax: [phone]

## 2010-06-26 NOTE — H&P (Signed)
NAME:  [name], [name] NO.:  [id number]   MEDICAL RECORD NO.:  [id number]          PATIENT TYPE:  EMS   LOCATION:  MAJO                         FACILITY:  [facility]   PHYSICIAN:  Gustavus Messing. Orlin Hilding, M.D.DATE OF BIRTH:  1926/01/08   DATE OF ADMISSION:  10/02/2004  DATE OF DISCHARGE:                                HISTORY & PHYSICAL   CHIEF COMPLAINT:  Code stroke, sudden onset aphasia, right-sided weakness, 7  p.m. The patient arrived within an hour.   HISTORY OF PRESENT ILLNESS:  [name] is a 77 year old right handed white  male with history of atrial fibrillation and hypertension. Patient of Dr.  Yevonne Pax. He is not on any chronic anticoagulation. He is on low dose  aspirin. He had a normal day, worked out in the yard all day; came in,  showered, at dinner. Around 7 p.m. he sat down to watch TV, coughed  violently and then was unable to speak. His wife also noted a right facial  droop, called 911. He was reported at some point to have some mild right  sided weakness as well. He has been looking somewhat better since he came in  terms of the weakness. However, the language deficit is pretty pronounced,  still has a significant expressive aphasia.   REVIEW OF SYSTEMS:  Review of systems was negative for any previous  complaints of chest pain, shortness of breath, or headache.   PAST MEDICAL HISTORY:  1.  Significant for atrial fibrillation, only on aspirin.  2.  Hypertension.  3.  Hyperlipidemia (although this history is questionable). He is on a      cholesterol lowering agent but the family says he has never had elevated      cholesterol.   PAST SURGICAL HISTORY:  He has had a remote appendectomy, remote  hemorrhoidectomy.   MEDICATIONS:  1.  Simvastatin 80 mg once daily.  2.  Aspirin 81 mg daily.  3.  Hydrochlorothiazide 25 mg 1/2 daily.  4.  Diovan 40 mg 1/2 daily.  5.  Toprol XL 50 mg b.i.d.   ALLERGIES:  LOTENSIN.   SOCIAL HISTORY:  He is  married, has a son. He is a retired Geophysical data processor. No cigarette or alcohol use.   FAMILY HISTORY:  Positive for cardiac disease and cancer.   PHYSICAL EXAMINATION:  VITAL SIGNS:  Blood pressure is 156/71, respirations  22, pulse 77, 96% saturation on room air.  HEENT:  Head is normocephalic, atraumatic.  NECK:  Supple without bruits.  HEART:  Regular rate and rhythm, currently he has a history of atrial  fibrillation but he is not in atrial fibrillation now.  LUNGS:  Clear to auscultation.  ABDOMEN:  Soft.  EXTREMITIES:  Without edema.  NEUROLOGIC EXAM:  Mental status - he scores a total of 6 on the NIH Stroke  Scale.  He is awake and alert. He follows commands. He does not answer either  question accurately. His gaze is normal. Visual fields are normal. He has a  slight right facial droop. There is no drift in upper or lower extremities  on  either right or left currently, and no ataxia although he is apraxic.  Sensory is normal. Language - he has an expressive aphasia that I would  consider moderate to severe at this time, I gave him a 2. He can make some  social speech but otherwise he is absolutely speaking nonsensical syllables.  He has a mild dysarthria and there is no evidence of a neglect. His reflexes  are normal.   CT shows an old right cerebellar infarct but nothing acute.   LABORATORY DATA:  His CBC is normal. His C-MET is basically normal except  for a slightly elevated BUN at 32, glucose is 100. PTT was normal at 27, PT  14.4 with an INR of 1.1.   IMPRESSION:  Suspect acute embolic left MCA infarct secondary to atrial  fibrillation. He is not on Coumadin.   PLAN:  We are at just under 2 hours now. Give him 2/3 dose of IV tPA and  then will do an angiogram to evaluate the possibility of further extraction.  Eventually he will need to go back on Coumadin and complete the work up but  it looks like it is most likely cardioembolic.      Catherine A. Orlin Hilding,  M.D.  Electronically Signed     CAW/MEDQ  D:  10/02/2004  T:  10/03/2004  Job:  [id number]   cc:   Cassell Clement, M.D.  [address], [address]  [place]  [place] [address]  Fax: [phone]

## 2010-06-26 NOTE — Op Note (Signed)
   NAME:  [name], [name]                         ACCOUNT NO.:  [id number]   MEDICAL RECORD NO.:  [id number]                   PATIENT TYPE:  AMB   LOCATION:  ENDO                                 FACILITY:  [facility]   PHYSICIAN:  Graylin Shiver, M.D.                DATE OF BIRTH:  August 02, 1925   DATE OF PROCEDURE:  12/19/2002  DATE OF DISCHARGE:                                 OPERATIVE REPORT   PROCEDURE PERFORMED:  Colonoscopy.   INDICATIONS FOR PROCEDURE:  Screening.   Informed consent was obtained after explanation of the risks of bleeding,  infection, and perforation.   PREMEDICATIONS:  Fentanyl 40 mcg  IV, Versed 4 mg IV.   DESCRIPTION OF PROCEDURE:  With the patient in the left lateral decubitus  position, a rectal exam was performed and no masses were felt.  The Olympus  pediatric adjustable colonoscope was inserted into the rectum and advanced  around a very tortuous colon to the cecum.  Cecal landmarks were identified.  The cecum and ascending colon were normal.  The transverse colon was normal.  The descending colon and sigmoid revealed diverticulosis.  The rectum was  normal.  The patient tolerated the procedure well without complications.   IMPRESSION:  Diverticulosis.                                               Graylin Shiver, M.D.    SFG/MEDQ  D:  12/19/2002  T:  12/19/2002  Job:  [id number]   cc:   Al Decant. Janey Greaser, MD  [address]  [place]  [place] [address]  Fax: [phone]

## 2010-06-26 NOTE — Discharge Summary (Signed)
NAME:  [name], [name] NO.:  [id number]   MEDICAL RECORD NO.:  [id number]                   PATIENT TYPE:  INP   LOCATION:  3033                                 FACILITY:  [facility]   PHYSICIAN:  Lazaro Arms, M.D.        DATE OF BIRTH:  24-Jan-1926   DATE OF ADMISSION:  02/14/2002  DATE OF DISCHARGE:  06-20-202004                                 DISCHARGE SUMMARY   PRIMARY CARE Candid Bovey:  Dr. Al Decant. Janey Greaser at [facility].   DISCHARGE DIAGNOSES:  1. Dizziness, etiology unclear, likely a transient ischemic attack versus     mild volume depletion.  2. History of cerebrovascular accident in December 1999.  3. Hypertension.  4. History of noncardiac chest pain -- cardiologist is Dr. Gaspar Garbe B. Little.   BRIEF HISTORY OF PRESENT ILLNESS:  The patient presented to the emergency  room after having a sudden onset of dizziness while shaving in the morning.  He denied any vertigo.  He denied any nausea and chest pain or visual  blurring, swallowing difficulty, speech difficulty or weakness.  He did,  however, say that his gait was slightly wide-based; his review of systems  prior to this was otherwise negative.  In the emergency room, he had an  initial blood pressure of 90/60 with a normal pulse of 64.  His exam was  normal and the patient had no symptoms since he came to the emergency room.  Duration of symptoms is probable less than eight hours.   HOSPITAL COURSE:  The patient was admitted, he ruled out for myocardial  infarction.  A CT scan of the brain showed no intracranial hemorrhage.  An  MRI/MRA showed nothing acute per report.  He was started on Plavix  empirically, with the thought that this could be a TIA and if so, that would  mean he had failed aspirin therapy.  The patient continued to feel well and  wanted to go home.  He remained without neurologic or cardiac or respiratory  complaint and was discharged home in  stable condition on February 15, 2002.  His vital signs were 160/94, although he had just received his blood  pressure medications, pulse of 74 and he was 95% on room air.  His exam was  within normal limits.  Cranial nerves were intact.  His cardiac and  pulmonary exams were within normal limits.  His gait was normal.  His  strength, motor function and cerebellar functioning were all intact by  neurologic exam.   DISCHARGE MEDICATIONS:  1. Diltiazem 240 mg p.o. daily.  2. Diovan 150 mg p.o. daily.  3. Aspirin 81 mg p.o. daily.  4. Plavix 75 mg p.o. daily.    DISCHARGE INSTRUCTIONS:  The patient was told to follow up next week with  Dr. Janey Greaser and was given a prescription for the Plavix.  Lazaro Arms, M.D.    AMC/MEDQ  D:  2020-08-3002  T:  02/16/2002  Job:  [id number]   cc:   Al Decant. Janey Greaser, M.D.  [address]  [place]  [place] [address]  Fax: [phone]

## 2010-06-26 NOTE — Discharge Summary (Signed)
NAMEDEMITRIS, POKORNY NO.:  [id number]   MEDICAL RECORD NO.:  [id number]          PATIENT TYPE:  ORB   LOCATION:  4533                         FACILITY:  [facility]   PHYSICIAN:  Ellwood Dense, M.D.   DATE OF BIRTH:  [birth year]   DATE OF ADMISSION:  10/07/2004  DATE OF DISCHARGE:  10/14/2004                                 DISCHARGE SUMMARY   DISCHARGE DIAGNOSES:  1.  Left posterior middle cerebral artery, left PCA infarction.  2.  Dysphasia with aphasia secondary to cerebrovascular accident.  3.  Atrial fibrillation.  4.  Hypertension.  5.  Deep vein thrombosis left upper extremity axillary and basilic veins.  6.  Hypertension.  7.  History of cerebrovascular accident.   HISTORY OF PRESENT ILLNESS:  This is a 77 year old right-handed white male  with history of atrial fibrillation, hypertension admitted August 25 with  right-sided weakness and aphasia where MRI showed an acute infarction left  posterior middle cerebral artery territory, also small acute infarction left  occipital lobe, left PCA territory.  No hemorrhage identified.  Carotid  duplex negative.  Echocardiogram with ejection fraction 55-65% without  embolism.  Cerebral angiogram with occlusion of dominant left MCA with  revascularization per Dr. Corliss Skains.  Cardiology followup Dr. Patty Sermons for  atrial fibrillation.  Loaded with intravenous amiodarone.  Neurology consult  with Dr. Orlin Hilding.  Placed on Coumadin, aspirin therapy.  Maintained on a  mechanical soft diet.  The patient was admitted to subacute care services.   PAST MEDICAL HISTORY:  See discharge diagnoses.   ALLERGIES:  LOTENSIN.   SOCIAL HISTORY:  Lives with wife in [place].  Wife can assist as needed  on discharge.  One-level home, three steps to entry.  No alcohol.  Remote  smoker.   MEDICATIONS PRIOR TO ADMISSION:  1.  Lisinopril 20 mg daily.  2.  Simvastatin 40 mg daily.  3.  Hydrochlorothiazide 25 mg daily.  4.  Toprol XL  25 mg twice daily.  5.  Aspirin 81 mg daily.   HOSPITAL COURSE:  The patient with progressive gains while on rehab services  with therapies initiated daily.  The following issues are followed during  patient's rehab course.  Pertaining to Mr. Dilraj Killgore left posterior  middle cerebral artery infarction remains stable.  Maintained on aspirin,  Coumadin therapy.  His diet had been advanced to mechanical soft.  He had  fluent aphasia.  Outpatient therapies would be ongoing.  Functionally, he  was minimal assist ambulate 200 feet without assistive device needing  assistance for loss of balance.  Supervision transfers.  Full family  teaching was completed.  Outpatient therapies, again, as advised.  Atrial  fibrillation followed by cardiology services as noted above.  He remained on  Coumadin therapy.  No bleeding episodes.  Latest INR of 2.1.  He had been  loaded with amiodarone which would be adjusted as per cardiology services.  His diastolic pressures ranged from 63 to 76.  Latest EKG, normal sinus  rhythm on October 11, 2004.  Noted upon his admission to subacute services  August 30, some swelling  of the left upper extremity.  Ultrasound did reveal  axillary and basilic vein deep vein thrombosis.  The thrombus was mobile in  the axillary vein.  All other veins were patent.  It was advised to continue  Coumadin therapy.  He did have some mild cellulitis, and was treated with  Keflex x6 days.   Latest labs showed an INR of 2.1, hemoglobin 13.2, hematocrit 37.6, platelet  188,000, sodium 137, potassium 3.7, BUN 18, creatinine 1.2.   Discharge medications at time of dictation included Coumadin; latest dose is  7.5 mg, adjusted accordingly.  Aspirin 81 mg daily, amiodarone 200 mg two  tablets twice daily, hydrochlorothiazide 25 mg 1/2 tablet daily, Toprol XL  100 mg daily, Keflex 250 mg four times daily x1 day, lisinopril 20 mg twice  daily.  The patient would follow up with Dr.  Patty Sermons in his office on  Friday, September 8 for check of INR and adjustments in amiodarone as  advised.      Mariam Dollar, P.A.    ______________________________  Ellwood Dense, M.D.    DA/MEDQ  D:  10/13/2004  T:  10/13/2004  Job:  [id number]   cc:   Cassell Clement, M.D.  [address]  [place]  [place] [address]  Fax: [phone]   Gustavus Messing. Orlin Hilding, M.D.  Fax: [phone]

## 2010-06-26 NOTE — H&P (Signed)
[name], ECKHARDT NO.:  [id number]   MEDICAL RECORD NO.:  [id number]          PATIENT TYPE:  INP   LOCATION:  3728                         FACILITY:  [facility]   PHYSICIAN:  Melvyn Novas, M.D.  DATE OF BIRTH:  08/01/1925   DATE OF ADMISSION:  01/12/2006  DATE OF DISCHARGE:                              HISTORY & PHYSICAL   REFERRING PHYSICIAN:  Dr. Cassell Clement.   REASON FOR CONSULTATION:  He is a 77 year old Caucasian right-handed  gentleman admitted on January 12, 2006 with a history of chronic atrial  fibrillation, on Coumadin, as well as 2 strokes, 1 in December 1999 and  1 last year in August in the left MCA distribution, leaving him with  significant aphasia, apraxia, acalculia.  The patient had at that time  believed to have suffered an embolic stroke secondary to his atrial  fibrillation.  He has been on Coumadin, which was last adjusted in early  November.  He has a history of hypertension and status post appendectomy  and hemorrhoidectomy.  He has never suffered from hyperlipidemia,  diabetes or any traumatic brain injuries.  There were no spinal  surgeries.   Mr. Tagle woke up this morning at about 6 or 6:30 a.m. and went to the  bathroom to take a shower.  Afterwards, he turned to his sink and was  unable to figure out how to use toothpaste and he did not remember that  it belonged on the toothbrush.  He had combed himself, but could not do  any other maneuvers of the daily hygiene and had to ask his wife how to  brush his teeth.  She called Dr. Patty Sermons, who saw the patient and  obtained an INR; this was 1.6, so slightly subtherapeutic.  The patient  had diplopia at the time Dr. Patty Sermons saw him, which has now resolved.  He denies any headaches, any febrile illness, nausea, diarrhea, vertigo  and no ataxia or motor deficits were noticed.  He has a history of a  mild gait instability which has not worsened with today's events.   HOME  MEDICATIONS:  1. Coumadin 10 mg 3 times a week, 7.5 mg 2 times a week.  2. Aspirin 81 mg daily.  3. Amiodarone 100 mg a half a tablet a day.  4. He is also on lisinopril and Toprol for rate and blood pressure      control.   SOCIAL HISTORY:  The patient is married and lives with his wife.  The  couple has an adult son.  The patient is a retired Hotel manager.  He has no history of tobacco or alcohol or drug use.  He is a high school graduate who went through a trainee program.   He has never been a shift Financial controller.   PHYSICAL EXAMINATION:  VITAL SIGNS:  Temperature is 98.2 degrees  Fahrenheit, heart rate 78 beats per minute, which are at this time  regular, blood pressure is 158/88, respiratory rate is 16.  LUNGS:  Clear to auscultation.  COR:  Regular rate and rhythm.  No murmur.  The patient has no carotid  bruit.  O2 saturation is 96% on room air.   EXTREMITIES:  He has no peripheral clubbing, cyanosis or edema.  SKIN:  No rash.  NEUROLOGIC:  He is alert, pleasant, oriented and attempts to be  compliant with the neurologic orders.  At the beginning of the  conversation, the patient could neither tell the year or the complete  date.  He did not know how to express his age; however, he was aware  that his answers were insufficient and stated that it is on the tip of  his tongue, but he cannot get it out.  He was also not able to write  numbers.  Pupils were equal to accommodation.  He had extraocular  movements that were intact without nystagmus or diplopia, but he seemed  to have a right-sided visual field impairment homonymously.  Cranial  nerves otherwise showed no tongue or uvula deviation, no fasciculation,  no facial sensory loss.  Motor examination shows symmetric deep tendon  reflexes, no Babinski response, full range of motion for major motor  segments including neck and shoulder.  The patient's sensory examination  showed no loss of sensory to  pinprick, to vibration or temperature.  Cerebellar testing shows intact finger-to-nose maneuver and heel-to-shin  maneuver, no tremor.   ASSESSMENT:  This patient suffers from expressive aphasia, but he does  seem to have a very little trouble in general with comprehension.  Today's exacerbation of symptoms seems to be along the same line that  his initial deficits from last year's stroke were.  I therefore suspect  that the patient has a watershed infarct or a small embolic event that  would have affected the same area his previous stroke was affected by.  Since the patient was in and out of atrial fibrillation according to the  telemetry strip, the embolic nature of the stroke is likely.  I would  suggest, however, to do an MRI with MRA and evaluate if any new areas of  ischemic deficits are noted.  If a vascular stenosis is the possible  origin for this patient's problem, a transcranial Doppler should be  considered.  The patient will follow up tomorrow with the Stroke Service  under Dr. Delia Heady.      Melvyn Novas, M.D.  Electronically Signed     CD/MEDQ  D:  01/12/2006  T:  01/13/2006  Job:  [id number]   cc:   Pramod P. Pearlean Brownie, MD  Cassell Clement, M.D.

## 2010-06-26 NOTE — Assessment & Plan Note (Signed)
HISTORY OF PRESENT ILLNESS:  Mr. Furia returns to the clinic today for  follow up evaluation.  He has a history of atrial fibrillation along with  hypertension and is on chronic Coumadin for that atrial fibrillation.  He  suffered a recent posterior middle cerebral artery infarction along with his  involvement of his left posterior communicating artery.  He still has some  speech deficits and has occasional word finding difficulties, but for the  most part, can make his needs known.  He continues to be followed by Dr.  Patty Sermons, and is presently taking Coumadin 10 mg three times per week and  7.5 mg the other four days.  He also is followed by Dr. Janey Greaser, his primary  care physician.   The patient has completed 18 therapy sessions with speech.  He is released  to home exercise program, and his wife continues to work with him on a daily  basis.  He is independent with bathing and dressing and independent with  ambulation at this time.   MEDICATIONS:  1.  Coumadin 10 mg three times per week and 7.5 mg four days per week.  2.  Aspirin 81 mg daily.  3.  Amiodarone 100 mg one half tablet daily.  4.  Toprol/Lisinopril 20/12.5 one tablet daily.   REVIEW OF SYSTEMS:  Noncontributory.   PHYSICAL EXAMINATION:  GENERAL APPEARANCE:  Well-appearing fit adult male in  no acute discomfort.  VITAL SIGNS:  Blood pressure 101/61, pulse 71, respirations 16, O2  saturation 97% on room air.  EXTREMITIES:  He has 5/5 strength throughout the bilateral upper and lower  extremities.  Bulk and tone were normal.  Reflexes were 2+ and symmetrical.   He, for the most part, understands all spoken language and can read in the  office today.  He also gets most of his words out and expresses his needs,  although, he does have occasional word finding difficulties. His biggest  problem continues to be reading a paragraph and then explaining the meaning  in his own words.   IMPRESSION:  1.  Status post posterior  middle cerebral artery infarction and left      posterior communicating artery infarction.  2.  Aphagia, improved.  3.  Atrial fibrillation with hypertension.  4.  Deep vein thrombosis of the left upper extremity.  5.  Hypertension.  6.  History of CVA.   PLAN:  In the office today, no refill on medications is necessary.  He is  independent with all bathing and dressing and mobility and is making slow  but steady progress in terms of his speech and reading.  We will plan on  seeing patient in follow up on an as needed basis.  His wife continues to  work with him in terms of reading out loud and answering questions after he  has read a paragraph in the local paper.  We will plan on seeing him in  follow up on an as needed basis.          ______________________________  Ellwood Dense, M.D.    DC/MedQ  D:  02/19/2005 10:06:36  T:  02/19/2005 13:30:19  Job #:  [id number]

## 2010-06-26 NOTE — Assessment & Plan Note (Signed)
[name] returns to clinic today for follow-up evaluation accompanied by  his wife.  He is a 77 year old right-handed adult male with history of  atrial fibrillation, hypertension.  He was admitted October 02, 2004, with  right-sided weakness and aphasia.  An MRI scan had shown an acute infarction  in Ivan left posterior middle cerebral artery  territory along with a small  acute infarction in Ivan left occipital lobe.  No hemorrhage was identified.  Carotid Duplex study showed no internal carotid artery stenosis.  Echocardiogram  showed an ejection fraction of 55 to 65% without source of  embolus.  Cerebral angiogram showed occlusion of Ivan dominant left middle  cerebral artery  with revascularization.  Cardiology follow-up with Dr.  Patty Sermons for atrial fibrillation was completed.   [name] eventually stabilized and was moved to Ivan rehabilitation unit  October 07, 2004.  He remained there through discharge October 14, 2004.   Since discharge, [name] has been attending outpatient speech therapy.  He goes to church two to three times per week.  He still has significant  problems with speech production both in terms of verbal output and  calculations.  He is having significant problems doing simple math at this  time and had been very competent in Ivan past.   [name] continues to receive cardiology care from Dr. Patty Sermons.  He has  had three to four visits and his follow-up is scheduled for four weeks'  time.  Dr. Patty Sermons is managing his PT and INR on Coumadin.   [name] gets most of his medicines through [facility], although Ivan  Coumadin is not available at this point.  He is using 7.5 mg every day  except 10 mg on Sunday.  He has not followed up with his primary care  physician, Dr. Janey Greaser, at this point.   MEDICATIONS:  1.  Coumadin 5 mg one and a half tablets daily except two tablets on Sunday.  2.  Aspirin 81 mg daily.  3.  Amiodarone 100 mg one half  tablet daily.  4.  Toprol/lisinopril 20/12/5 one tablet daily.   PHYSICAL EXAMINATION:  GENERAL APPEARANCE:  A well-appearing, fit adult male  in no acute discomfort.  VITAL SIGNS:  Blood pressure 109/62, pulse 70, respirations 16 and O2  saturation 97% on room air.  NEUROLOGIC:  He has 5/5 strength throughout Ivan bilateral upper and lower  extremities.  Bulk and tone were normal and reflexes were 2+ and  symmetrical.  He has significant speech problems and is unable to always  find his words during speech.  He also has significant calculation problems  in terms of cognition.   IMPRESSION:  1.  Status post left posterior middle cerebral artery  infarction with left      posterior communicating artery infarction.  2.  Aphasia, improved.  3.  Atrial fibrillation.  4.  Hypertension.  5.  Deep venous thrombosis of Ivan left upper extremity axillary and basilic      veins.  6.  Hypertension.  7.  History of cerebrovascular accident.   In Ivan office today, no adjustment of medicines was necessary.  Will plan on  seeing [name] in follow-up in approximately three months' time with  continuation of outpatient speech therapy at this point.  He has continued  to  make progress and his wife is working with him extensively regarding his  speech and calculation abilities. Will plan on seeing him in follow-up in  approximately three  months' time.           ______________________________  Ellwood Dense, M.D.     DC/MedQ  D:  11/23/2004 10:02:35  T:  11/23/2004 11:43:51  Job #:  [id number]

## 2010-06-26 NOTE — H&P (Signed)
NAME:  Ivan, Ramsey NO.:  [id number]   MEDICAL RECORD NO.:  [id number]          PATIENT TYPE:  ORB   LOCATION:  4504                         FACILITY:  [facility]   PHYSICIAN:  Ranelle Oyster, M.D.DATE OF BIRTH:  August 04, 1925   DATE OF ADMISSION:  10/07/2004  DATE OF DISCHARGE:                                HISTORY & PHYSICAL   ATTENDING PHYSICIAN:  Ellwood Dense, M.D.   CHIEF COMPLAINT:  Right-sided weakness and aphasia.   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  He was admitted on August 25 with right-sided  weakness and aphasia.  MRI was positive for left MCA stroke as well as small  left PCA stroke.  The patient had cerebral angiogram, and this showed  occlusion of the dominant left MCA branch, and the patient underwent  revascularization per Dr. Corliss Skains.  The patient as placed on IV amiodarone  for atrial fibrillation and was on Coumadin for stroke prophylaxis along  with aspirin.  The patient has been progressing nicely with therapy.  He is  supervision for transfers, minimum assistance for gait.  He is still  displaying significant speech deficits, although these are improving.   REVIEW OF SYSTEMS:  Positive for weakness, heart palpitations, reflux. Other  pertinent positives are listed above.  Full Review of Systems is in the  written H&P.   PAST MEDICAL HISTORY:  1.  Atrial fibrillation.  2.  Hypertension.  3.  Prior stroke in 1998 without residual.  4.  Appendectomy.  5.  Hemorrhoids status post surgery.   FAMILY HISTORY:  Positive for coronary artery disease.   SOCIAL HISTORY:  Notable for the fact that he and his wife live in  [place], and his wife can assist.  They have a one-level home with three  steps to enter.  He was independent with a cane prior to arrival.   MEDICATIONS PRIOR TO ARRIVAL:   Lisinopril, Simvastatin, hydrochlorothiazide,  Toprol XL, and aspirin 81 mg daily.   ALLERGIES:  LOTENSIN.   PHYSICAL EXAMINATION:  VITAL SIGNS:  Blood pressure 141/79, pulse 76,  temperature 98.4, respiratory rate 18.  GENERAL:  The patient is pleasant and in no acute distress.  He is alert and  oriented x3.  EAR, NOSE, THROAT:  Exams are unremarkable.  NECK:  Supple without JVD or lymphadenopathy.  CHEST: Clear to auscultation bilaterally without wheezes, rales, or rhonchi.  HEART:  Regular rate and rhythm without murmurs, rubs, or gallops.  ABDOMEN:  Soft, nontender.  NEUROLOGIC:  The patient has mild right central seventh.  Visual fields are  grossly intact.  He has good tongue control and swallow as well as gag  reflex.  The patient is able to follow one-step commands without difficulty  and did well with two-step commands as well.  He had difficulty with  expression and obvious expressive aphasia.  Receptive component of speech  seems to be fairly intact.  Sensation was a bit diminished on the right side  at 1/2.  Reflexes were 1+ to 2+ throughout.  Questionable apraxia right  side.  The function was 4 to 4+/5 on the right, 5/5 on the left. The patient  had full passive and active range of motion today.  Judgment and memory are  difficult to assess. The patient had several strings of clear speech and  appropriate phrases and answers.  Often speech was garbled.  Mood and affect  were generally appropriate.   ASSESSMENT AND PLAN:  1.  Functional deficits secondary to left middle cerebral artery and      posterior cerebral infarcts with dysphasia, aphasia, and mild right-      sided weakness deficits.  Subacute level rehabilitation with modified      independent goals, occasional supervision.  Estimated length of stay is      7+ days.  2.  Deep vein thrombosis/anticoagulation:  Continue Coumadin.  3.  Atrial fibrillation:  Continue Coumadin and beta blocker as well as       amiodarone 400 mg b.i.d.  4.  Hypertension: Continue Lisinopril, Toprol, hydrochlorothiazide.      Ranelle Oyster, M.D.  Electronically Signed     ZTS/MEDQ  D:  10/07/2004  T:  10/07/2004  Job:  [id number]

## 2010-06-26 NOTE — Discharge Summary (Signed)
NAMEORIAN, [name] NO.:  [id number]   MEDICAL RECORD NO.:  [id number]          PATIENT TYPE:  INP   LOCATION:  3004                         FACILITY:  [facility]   PHYSICIAN:  Melvyn Novas, M.D.  DATE OF BIRTH:  Apr 06, 1925   DATE OF ADMISSION:  10/02/2004  DATE OF DISCHARGE:  10/07/2004                                 DISCHARGE SUMMARY   DISCHARGE DIAGNOSES:  1.  Acute left MCA infarct secondary to atrial fibrillation.  2.  Atrial fibrillation.  3.  Hypertension.  4.  History of transient ischemic attack/stroke.  5.  Status post appendectomy.  6.  Status post hemorrhoidectomy.   DISCHARGE MEDICATIONS:  1.  Aspirin 81 mg a day.  2.  Coumadin per pharmacy protocol.  3.  Amiodarone 400 mg t.i.d.  4.  Lisinopril 20 mg a day.  5.  Hydrochlorothiazide 12.5 mg a day.  6.  Toprol 100 mg XL daily.   STUDIES PERFORMED:  1.  CT of the brain on admission shows atrophy with small vessel white      matter disease.  No acute intracranial abnormalities.  Chest x-ray shows      cardiomegaly and pulmonary venous hypertension, mild bibasilar      atelectasis.  2.  Follow-up CT shows atrophy and small vessel disease, no acute      abnormality.  3.  CT of the brain on day two showed acute infarct in the left temporal and      parietal lobe, small 5 mm area of acute hemorrhage in the left posterior      temporal lobe not present on prior CTs.  4.  MRI of the brain shows acute infarct in the left posterior MCA      territory, small acute infarct in the left PCA territory.  No hemorrhage      or midline shift is identified.  5.  EKG shows normal sinus rhythm, right bundle branch block, septal infarct      age undetermined, abnormal ECG.  6.  2-D echocardiogram shows ejection fraction 55-65%, moderate asymmetric      septal hypertrophy, no obvious embolic source.  7.  EKG on August 28 shows atrial fibrillation.  8.  Carotid Doppler is normal.   LABORATORY STUDIES:  Normal  CBC except for platelets 130.  INR day of  discharge 1.3.  Sodium 137, potassium 3.8, chloride 106, CO2 26, glucose  147, BUN 17, creatinine 1.1.  Calcium 8.9, total protein 6.2, albumin 3.2.  Liver function tests normal.  Homocysteine 11.627, hemoglobin A1C 5.3.  Elevated troponin I 1.33 down to 0.73.  MBs 5.4 and 3.8.  CKs 273 and 270.  Cholesterol 131, triglycerides 91, HDL 36, and LDL 77.  T4 8.7, TSH 1.409.  Urine drug screen was positive for benzodiazepine and barbiturates.  Urine  had 0-2 white blood cells, 11-20 red blood cells, and few bacteria.   HISTORY OF PRESENT ILLNESS:  [name] is a 77 year old right-handed white  male with history of atrial fibrillation, hypertension who had a normal day,  worked in his yard, came in, showered, and  ate.  About 7 p.m. he was  watching T.V., coughed violently, then would not speak to his wife.  She  noted right facial droop and called 911.  Upon arrival to the emergency room  he looked better.  CT of his head showed no acute abnormality.  He was  given two-thirds dose IV TPA and sent to angiogram suite to evaluate for  possible intra-arterial TPA and [facility] device.  During procedure heart was  found to be dissipating but he did receive some 4.5 mg of IA TPA.  He was  admitted to the ICU and was followed from there.   HOSPITAL COURSE:  Patient did well after sheaths had been pulled from intra-  arterial TPA.  He was initially made n.p.o. due to the fact that he had to  lay flat after the angiogram, but then progressed rapidly to a regular diet  and thin liquids.  Dr. Patty Sermons was asked to consult on him as he is his  primary.  He did have elevated cardiac enzymes which were thought to be  rhythm-related and not MI.  He was started on amiodarone as his atrial  fibrillation was poorly controlled on Cardizem.  The decision was made to  start the patient on IV heparin to Coumadin for secondary stroke prevention  with his history of atrial  fibrillation.   He was transferred to the floor where he had therapy evaluations.  His  strength was relatively good but his gait was unsteady and it was felt he  would be a good inpatient rehabilitation candidate.  He still continued to  have problems with expressive aphasia and they were going to follow-up  there.  Once a bed became available and approval was received patient was  transferred.   CONDITION ON DISCHARGE:  Patient alert and oriented x3.  Speech slightly  dysarthric.  Has expressive aphasia with good spontaneous speech, but anomia  and difficulty repeating.  He uses neologisms and paraphasias on a frequent  basis.  He has minimal right facial weakness.  His visual fields are full.  His chest is clear to auscultation and his heart rate is regular.  His  strength is normal.  He does have some ataxia on his left side.   DISCHARGE PLAN:  1.  Transfer to rehabilitation for continuation of PT, OT, and speech      therapy.  2.  Coumadin and baby aspirin for secondary stroke prevention.  3.  Follow up Dr. Patty Sermons in his office at time of discharge for Coumadin      adjustment and follow up with Dr. Pearlean Brownie in two to three months.      [name], N.P.    ______________________________  Melvyn Novas, M.D.    SB/MEDQ  D:  10/07/2004  T:  10/07/2004  Job:  [id number]   cc:   Pramod P. Pearlean Brownie, MD  Fax: [phone]   Cassell Clement, M.D.  [address]  [place]  [place] [address]  Fax: [phone]

## 2010-07-02 ENCOUNTER — Telehealth: Payer: Self-pay | Admitting: *Deleted

## 2010-07-02 DIAGNOSIS — I4891 Unspecified atrial fibrillation: Secondary | ICD-10-CM

## 2010-07-02 MED ORDER — METOPROLOL TARTRATE 25 MG PO TABS: ORAL_TABLET | ORAL | Status: DC

## 2010-07-02 NOTE — Telephone Encounter (Signed)
Agree with present meds

## 2010-07-02 NOTE — Telephone Encounter (Signed)
Patients wife phoned stating they would like to change from metoprolol succ 25 mg 1/2 tab daily to metoprolol tartrate secondary to cost.  Called in metoprolol tartrate 25 mg 1/2 tab [facility]

## 2010-07-07 ENCOUNTER — Ambulatory Visit (INDEPENDENT_AMBULATORY_CARE_PROVIDER_SITE_OTHER): Payer: Medicare Other | Admitting: *Deleted

## 2010-07-07 DIAGNOSIS — I4891 Unspecified atrial fibrillation: Secondary | ICD-10-CM

## 2010-07-07 DIAGNOSIS — Z7901 Long term (current) use of anticoagulants: Secondary | ICD-10-CM

## 2010-07-07 LAB — POCT INR: INR: 2.3

## 2010-07-18 ENCOUNTER — Other Ambulatory Visit: Payer: Self-pay | Admitting: Cardiology

## 2010-07-20 NOTE — Telephone Encounter (Signed)
Med refill

## 2010-08-04 ENCOUNTER — Telehealth: Payer: Self-pay | Admitting: *Deleted

## 2010-08-04 ENCOUNTER — Encounter: Payer: Medicare Other | Admitting: *Deleted

## 2010-08-04 ENCOUNTER — Telehealth: Payer: Self-pay | Admitting: Cardiology

## 2010-08-04 NOTE — Telephone Encounter (Signed)
LMOM for pt to call back and schedule and INR

## 2010-08-04 NOTE — Telephone Encounter (Signed)
Patient's wife is concerned that he's going nearly 2 months w/out having his coumadin checked. She is returning your call.

## 2010-08-05 ENCOUNTER — Telehealth: Payer: Self-pay | Admitting: *Deleted

## 2010-08-05 NOTE — Telephone Encounter (Signed)
Spoke with spouse;  Per TAB states it is ok for pt to check his INR every 2 months since he has not changed his dose in the past 2 years;  Pt spouse was notified.

## 2010-08-19 ENCOUNTER — Other Ambulatory Visit: Payer: Self-pay | Admitting: *Deleted

## 2010-08-19 DIAGNOSIS — E785 Hyperlipidemia, unspecified: Secondary | ICD-10-CM

## 2010-09-04 ENCOUNTER — Encounter: Payer: Self-pay | Admitting: Cardiology

## 2010-09-07 ENCOUNTER — Encounter: Payer: Self-pay | Admitting: Cardiology

## 2010-09-07 ENCOUNTER — Ambulatory Visit (INDEPENDENT_AMBULATORY_CARE_PROVIDER_SITE_OTHER): Payer: Medicare Other | Admitting: Cardiology

## 2010-09-07 ENCOUNTER — Other Ambulatory Visit: Payer: Self-pay | Admitting: *Deleted

## 2010-09-07 ENCOUNTER — Ambulatory Visit (INDEPENDENT_AMBULATORY_CARE_PROVIDER_SITE_OTHER): Payer: Medicare Other | Admitting: *Deleted

## 2010-09-07 ENCOUNTER — Other Ambulatory Visit (INDEPENDENT_AMBULATORY_CARE_PROVIDER_SITE_OTHER): Payer: Medicare Other | Admitting: *Deleted

## 2010-09-07 VITALS — BP 120/70 | HR 80 | Wt 223.0 lb

## 2010-09-07 DIAGNOSIS — I509 Heart failure, unspecified: Secondary | ICD-10-CM

## 2010-09-07 DIAGNOSIS — I4891 Unspecified atrial fibrillation: Secondary | ICD-10-CM

## 2010-09-07 DIAGNOSIS — I5032 Chronic diastolic (congestive) heart failure: Secondary | ICD-10-CM

## 2010-09-07 DIAGNOSIS — R42 Dizziness and giddiness: Secondary | ICD-10-CM

## 2010-09-07 DIAGNOSIS — I119 Hypertensive heart disease without heart failure: Secondary | ICD-10-CM

## 2010-09-07 DIAGNOSIS — Z7901 Long term (current) use of anticoagulants: Secondary | ICD-10-CM

## 2010-09-07 LAB — POCT INR: INR: 3.3

## 2010-09-07 MED ORDER — MECLIZINE HCL 32 MG PO TABS: ORAL_TABLET | ORAL | Status: DC

## 2010-09-07 MED ORDER — MECLIZINE HCL 32 MG PO TABS: 32.0000 mg | ORAL_TABLET | Freq: Three times a day (TID) | ORAL | Status: DC | PRN

## 2010-09-07 NOTE — Assessment & Plan Note (Signed)
The patient has a history of diastolic congestive heart failure.  Since last visit he's had no new symptoms.  He denies chest pain or chest tightness in his not having any evidence of orthopnea or paroxysmal nocturnal dyspnea or significant peripheral edema.

## 2010-09-07 NOTE — Assessment & Plan Note (Signed)
Patient has a past history of essential hypertension.  In the office we check his blood pressure supine sitting and standing and there was no significant orthostatic drop in his blood pressure today to explain his symptoms of dizziness

## 2010-09-07 NOTE — Assessment & Plan Note (Signed)
The patient has a history of chronic atrial fibrillation.  He has had prior embolic strokes.  He comes in today for a scheduled followup office visit.  He has not been feeling well because of recurrent problems with dizziness.  The dizziness is a sensation of lightheadedness.  There is no definite sensation of motion or classical vertigo.  When he tries to walk down the hall of his home sometimes she will get dizzy and his legs began to buckle.  His wife has to catch him.  He does use a rolling walker now.

## 2010-09-07 NOTE — Telephone Encounter (Signed)
Called change in meclizine dose to [facility]

## 2010-09-07 NOTE — Progress Notes (Signed)
Claudean Kinds Date of Birth:  11/02/1925 [facility] / [facility] [address]   [address] [place], [place]  [address] [phone]           Fax   [phone]  History of Present Illness: This pleasant 77 year old gentleman is seen for a scheduled followup office visit.  He has a history of chronic atrial fibrillation.  He has had previous embolic stroke with resultant expressive aphasia.  He's had no recent TIA symptoms since his Coumadin has been under good control.  He has been experiencing some recent dizziness and unsteadiness of gait he is not demonstrating any evidence of orthostatic hypotension he's had a past history of congestive heart failure but no recent symptoms.  His last echocardiogram on 12/19/07 showed normal left ventricular systolic function with an ejection fraction of 55-60% and no wall motion abnormalities, mild aortic stenosis and mild mitral regurgitation and mild tricuspid regurgitation with mild pulmonary hypertension and a pulmonary artery pressure of 38.  The patient has no history of ischemic heart disease and he had a normal adenosine Cardiolite stress test 01/11/08 which showed mild LV systolic dysfunction and evidence of an old small scar on the septum and a larger apical scar but no reversible ischemia.  He has not had cardiac catheter.  Current Outpatient Prescriptions  Medication Sig Dispense Refill  . aspirin 81 MG tablet Take 81 mg by mouth daily.        . cholecalciferol (VITAMIN D) 1000 UNITS tablet Take 1,000 Units by mouth daily.        . Coenzyme Q10 (CO Q 10 PO) Take by mouth.        . digoxin (LANOXIN) 0.125 MG tablet Take 1 tablet (125 mcg total) by mouth daily. Taking every other day  45 tablet  3  . finasteride (PROSCAR) 5 MG tablet Take 5 mg by mouth daily.        . furosemide (LASIX) 20 MG tablet Take 1 tablet (20 mg total) by mouth 2 (two) times daily. Taking two bid  180 tablet  3  . lisinopril (PRINIVIL,ZESTRIL) 20 MG tablet  Take 1 tablet (20 mg total) by mouth daily.  90 tablet  3  . lisinopril-hydrochlorothiazide (PRINZIDE,ZESTORETIC) 20-12.5 MG per tablet Take 1 tablet by mouth daily.  90 tablet  3  . metoprolol tartrate (LOPRESSOR) 25 MG tablet 1/2 daily  90 tablet  3  . Multiple Vitamin (MULTIVITAMIN PO) Take by mouth.        . nitroGLYCERIN (NITROSTAT) 0.4 MG SL tablet Place 0.4 mg under the tongue every 5 (five) minutes as needed.        Marland Kitchen oxybutynin (DITROPAN) 5 MG tablet Take 5 mg by mouth daily. Taking daily      . pravastatin (PRAVACHOL) 40 MG tablet Take 20 mg by mouth every evening.        . warfarin (COUMADIN) 5 MG tablet ALTERNATE TAKING ONE TABLET ONCE DAILY AND TAKING TWO TABLETS BY MOUTH EVERY OTHER DAY  135 tablet  prn  . DISCONTD: pravastatin (PRAVACHOL) 40 MG tablet Take 1 tablet (40 mg total) by mouth every evening.  90 tablet  3  . meclizine (ANTIVERT) 32 MG tablet Take 25mg   Three times a day  30 tablet  0    Allergies  Allergen Reactions  . Lotensin     lethargic    Patient Active Problem List  Diagnoses  . Atrial fibrillation  . Encounter for long-term (current) use of anticoagulants  .  Right bundle branch block  . Cerebrovascular accident, embolic  . Aortic stenosis, mild  . Chronic diastolic congestive heart failure  . Hypercholesterolemia  . BPH (benign prostatic hyperplasia)  . Benign hypertensive heart disease without heart failure  . Dizziness - light-headed    History  Smoking status  . Former Smoker  Smokeless tobacco  . Not on file    History  Alcohol Use: Not on file    Family History  Problem Relation Age of Onset  . Heart disease Sister   . Cancer Brother     Review of Systems: Constitutional: no fever chills diaphoresis or fatigue or change in weight.  Head and neck: no hearing loss, no epistaxis, no photophobia or visual disturbance. Respiratory: No cough, shortness of breath or wheezing. Cardiovascular: No chest pain peripheral edema,  palpitations. Gastrointestinal: No abdominal distention, no abdominal pain, no change in bowel habits hematochezia or melena. Genitourinary: No dysuria, no frequency, no urgency, no nocturia. Musculoskeletal:No arthralgias, no back pain, no gait disturbance or myalgias. Neurological: No  headaches, no numbness, no seizures, no syncope, no weakness, no tremors.Positive for dizziness and lightheadedness Hematologic: No lymphadenopathy, no easy bruising. Psychiatric: No confusion, no hallucinations, no sleep disturbance.    Physical Exam: Filed Vitals:   09/07/10 1114  BP: 120/70  Pulse: 80   The general appearance reveals a well-developed well-nourished gentleman in no distress.Pupils equal and reactive.   Extraocular Movements are full.  There is no scleral icterus.  The mouth and pharynx are normal.  The neck is supple.  The carotids reveal no bruits.  The jugular venous pressure is normal.  The thyroid is not enlarged.  There is no lymphadenopathy.  The chest is clear to percussion and auscultation. There are no rales or rhonchi. Expansion of the chest is symmetrical.  The precordium is quiet.  The first heart sound is normal.  The second heart sound is physiologically split.  There is no murmur gallop rub or click.  There is no abnormal lift or heave.The rhythm is irregular The abdomen is soft and nontender. Bowel sounds are normal. The liver and spleen are not enlarged. There Are no abdominal masses. There are no bruits.  Normal extremity without phlebitis or edema.  Pedal pulses are presentThe skin is warm and dry.  There is no rash.   neurologic exam reveals expressive aphasia of a mild degree  Assessment / Plan: To further assess his complaints of dizziness we are getting a CT scan of the head without contrast today.  He will continue his same medication.  Recheck in 4 months for followup office visit

## 2010-09-08 ENCOUNTER — Ambulatory Visit
Admission: RE | Admit: 2010-09-08 | Discharge: 2010-09-08 | Disposition: A | Discharge status: Home / Self Care | Payer: Medicare Other | Source: Ambulatory Visit | Attending: Cardiology | Admitting: Cardiology

## 2010-09-08 DIAGNOSIS — R42 Dizziness and giddiness: Secondary | ICD-10-CM

## 2010-09-09 ENCOUNTER — Encounter: Payer: Self-pay | Admitting: *Deleted

## 2010-09-09 ENCOUNTER — Telehealth: Payer: Self-pay | Admitting: Cardiology

## 2010-09-09 NOTE — Telephone Encounter (Signed)
CT results reported to wife. Verbalizes understanding to continue same meds.

## 2010-09-09 NOTE — Telephone Encounter (Signed)
Message copied by Karle Plumber on Wed Sep 09, 2010 11:25 AM ------      Message from: Cassell Clement      Created: Tue Sep 08, 2010  9:38 PM       Please report.  The CT just showed the multiple old infarcts.  No brain tumor etc.He also has some atrophy or shrinkage of the brain from age etc.Nothing acute seen.  Continue same meds

## 2011-01-06 ENCOUNTER — Encounter: Payer: Self-pay | Admitting: Cardiology

## 2011-01-06 ENCOUNTER — Ambulatory Visit (INDEPENDENT_AMBULATORY_CARE_PROVIDER_SITE_OTHER): Payer: Medicare Other | Admitting: Cardiology

## 2011-01-06 VITALS — BP 120/78 | HR 60 | Ht 72.0 in | Wt 192.0 lb

## 2011-01-06 DIAGNOSIS — I4510 Unspecified right bundle-branch block: Secondary | ICD-10-CM

## 2011-01-06 DIAGNOSIS — R0989 Other specified symptoms and signs involving the circulatory and respiratory systems: Secondary | ICD-10-CM

## 2011-01-06 DIAGNOSIS — I119 Hypertensive heart disease without heart failure: Secondary | ICD-10-CM

## 2011-01-06 DIAGNOSIS — I509 Heart failure, unspecified: Secondary | ICD-10-CM

## 2011-01-06 DIAGNOSIS — I4891 Unspecified atrial fibrillation: Secondary | ICD-10-CM

## 2011-01-06 DIAGNOSIS — R0600 Dyspnea, unspecified: Secondary | ICD-10-CM

## 2011-01-06 DIAGNOSIS — I5032 Chronic diastolic (congestive) heart failure: Secondary | ICD-10-CM

## 2011-01-06 DIAGNOSIS — R0609 Other forms of dyspnea: Secondary | ICD-10-CM

## 2011-01-06 DIAGNOSIS — E7800 Pure hypercholesterolemia, unspecified: Secondary | ICD-10-CM

## 2011-01-06 LAB — HEPATIC FUNCTION PANEL
ALT: 23 U/L (ref 0–53)
AST: 21 U/L (ref 0–37)
Albumin: 4.3 g/dL (ref 3.5–5.2)
Total Bilirubin: 1 mg/dL (ref 0.3–1.2)

## 2011-01-06 LAB — CBC WITH DIFFERENTIAL/PLATELET
Basophils Absolute: 0 10*3/uL (ref 0.0–0.1)
Eosinophils Absolute: 0.1 10*3/uL (ref 0.0–0.7)
Hemoglobin: 14.9 g/dL (ref 13.0–17.0)
Lymphocytes Relative: 15.5 % (ref 12.0–46.0)
MCHC: 33.9 g/dL (ref 30.0–36.0)
Monocytes Relative: 8 % (ref 3.0–12.0)
Neutro Abs: 7.2 10*3/uL (ref 1.4–7.7)
Platelets: 147 10*3/uL — ABNORMAL LOW (ref 150.0–400.0)
RDW: 14.2 % (ref 11.5–14.6)

## 2011-01-06 LAB — BASIC METABOLIC PANEL
BUN: 29 mg/dL — ABNORMAL HIGH (ref 6–23)
CO2: 27 mEq/L (ref 19–32)
Calcium: 10.2 mg/dL (ref 8.4–10.5)
Creatinine, Ser: 1.3 mg/dL (ref 0.4–1.5)
GFR: 58.05 mL/min — ABNORMAL LOW (ref >60.00)
Glucose, Bld: 104 mg/dL — ABNORMAL HIGH (ref 70–99)
Sodium: 141 mEq/L (ref 135–145)

## 2011-01-06 LAB — LIPID PANEL
HDL: 36.6 mg/dL — ABNORMAL LOW (ref >39.00)
Total CHOL/HDL Ratio: 3
Triglycerides: 126 mg/dL (ref 0.0–149.0)
VLDL: 25.2 mg/dL (ref 0.0–40.0)

## 2011-01-06 LAB — BRAIN NATRIURETIC PEPTIDE: Pro B Natriuretic peptide (BNP): 416 pg/mL — ABNORMAL HIGH (ref 0.0–100.0)

## 2011-01-06 NOTE — Assessment & Plan Note (Signed)
The patient is very sedentary.  He lies down to rest after breakfast.  He notices shortness of breath at that time but takes a nap in the afternoons.  His weight is actually down and he is not having a significant peripheral edema at this time.  We are checking a B. natruretic peptide today he does sleep on one elevated pillow.

## 2011-01-06 NOTE — Assessment & Plan Note (Signed)
No TIA symptoms or thromboembolic symptoms or recurrent stroke.  Still has significant expressive aphasia from his initial stroke.

## 2011-01-06 NOTE — Patient Instructions (Signed)
Your physician recommends that you continue on your current medications as directed. Please refer to the Current Medication list given to you today. Will obtain labs and call with the results Your physician recommends that you schedule a follow-up appointment in: 4 months with fasting labs (lp/bmet/hfp)

## 2011-01-06 NOTE — Assessment & Plan Note (Signed)
His blood pressure has been maintaining in satisfactory range on present cardiac medications

## 2011-01-06 NOTE — Progress Notes (Signed)
[name] Date of Birth:  [birth year] [facility] / [facility] [address]   [address] [place], [place]  [address] [phone]           Fax   [phone]  History of Present Illness: This pleasant 77 year old gentleman is seen for a scheduled four-month followup office visit.  He has a history of chronic atrial fibrillation.  He has a history of a previous embolic stroke with expressive aphasia.  He has a history of chronic diastolic congestive heart failure.  His last echocardiogram 12/19/07 showed an ejection fraction of 55-60% and mild aortic stenosis and mild mitral regurgitation and mild pulmonary hypertension.  He had a normal adenosine Cardiolite stress test in December 2009 showing mild left ventricular systolic dysfunction and evidence of an old scar on the septum and a larger apical scar but no reversible ischemia.  The patient has not had cardiac catheterization.  Recently he's been experiencing some increased shortness of breath.  Current Outpatient Prescriptions  Medication Sig Dispense Refill  . aspirin 81 MG tablet Take 81 mg by mouth daily.        . cholecalciferol (VITAMIN D) 1000 UNITS tablet Take 1,000 Units by mouth daily. Taking 10,000 daily      . Coenzyme Q10 (CO Q 10 PO) Take by mouth.        . digoxin (LANOXIN) 0.125 MG tablet Take 1 tablet (125 mcg total) by mouth daily. Taking every other day  45 tablet  3  . finasteride (PROSCAR) 5 MG tablet Take 5 mg by mouth daily.        . furosemide (LASIX) 40 MG tablet Take 40 mg by mouth 2 (two) times daily.        Marland Kitchen lisinopril (PRINIVIL,ZESTRIL) 20 MG tablet Take 1 tablet (20 mg total) by mouth daily.  90 tablet  3  . lisinopril-hydrochlorothiazide (PRINZIDE,ZESTORETIC) 20-12.5 MG per tablet Take 1 tablet by mouth daily.  90 tablet  3  . metoprolol tartrate (LOPRESSOR) 25 MG tablet 1/2 daily  90 tablet  3  . Multiple Vitamin (MULTIVITAMIN PO) Take by mouth.        . nitroGLYCERIN (NITROSTAT) 0.4 MG SL  tablet Place 0.4 mg under the tongue every 5 (five) minutes as needed.        Marland Kitchen oxybutynin (DITROPAN) 5 MG tablet Take 5 mg by mouth daily. Taking daily      . pravastatin (PRAVACHOL) 40 MG tablet Take 20 mg by mouth every evening.        . warfarin (COUMADIN) 5 MG tablet ALTERNATE TAKING ONE TABLET ONCE DAILY AND TAKING TWO TABLETS BY MOUTH EVERY OTHER DAY  135 tablet  prn  . DISCONTD: pravastatin (PRAVACHOL) 40 MG tablet Take 1 tablet (40 mg total) by mouth every evening.  90 tablet  3    Allergies  Allergen Reactions  . Lotensin     lethargic    Patient Active Problem List  Diagnoses  . Atrial fibrillation  . Encounter for long-term (current) use of anticoagulants  . Right bundle branch block  . Cerebrovascular accident, embolic  . Aortic stenosis, mild  . Chronic diastolic congestive heart failure  . Hypercholesterolemia  . BPH (benign prostatic hyperplasia)  . Benign hypertensive heart disease without heart failure  . Dizziness - light-headed    History  Smoking status  . Former Smoker  Smokeless tobacco  . Not on file    History  Alcohol Use: Not on file    Family  History  Problem Relation Age of Onset  . Heart disease Sister   . Cancer Brother     Review of Systems: Constitutional: no fever chills diaphoresis or fatigue or change in weight.  Head and neck: no hearing loss, no epistaxis, no photophobia or visual disturbance. Respiratory: No cough, shortness of breath or wheezing. Cardiovascular: No chest pain peripheral edema, palpitations. Gastrointestinal: No abdominal distention, no abdominal pain, no change in bowel habits hematochezia or melena. Genitourinary: No dysuria, no frequency, no urgency, no nocturia. Musculoskeletal:No arthralgias, no back pain, no gait disturbance or myalgias. Neurological: No dizziness, no headaches, no numbness, no seizures, no syncope, no weakness, no tremors. Hematologic: No lymphadenopathy, no easy  bruising. Psychiatric: No confusion, no hallucinations, no sleep disturbance.    Physical Exam: Filed Vitals:   01/06/11 1030  BP: 120/78  Pulse: 60   the general appearance reveals a large gentleman in no acute distress.  He is dyspneic with talking.Pupils equal and reactive.   Extraocular Movements are full.  There is no scleral icterus.  The mouth and pharynx are normal.  The neck is supple.  The carotids reveal no bruits.  The jugular venous pressure is normal.  The thyroid is not enlarged.  There is no lymphadenopathy.  The chest is clear to percussion and auscultation. There are no rales or rhonchi. Expansion of the chest is symmetrical.  The precordium is quiet.  The first heart sound is normal.  The second heart sound is physiologically split.  There is no murmur gallop rub or click.  There is no abnormal lift or heave.  Rhythm is irregular in atrial fibrillation with a controlled ventricular response.The abdomen is soft and nontender. Bowel sounds are normal. The liver and spleen are not enlarged. There Are no abdominal masses. There are no bruits.  The pedal pulses are good.  There is no phlebitis or edema.  There is no cyanosis or clubbing. Strength is normal and symmetrical in all extremities.  There is no lateralizing weakness.  There are no sensory deficits.  He does have significant expressive aphasia. The skin is warm and dry.  There is no rash.        Assessment / Plan: Continue present medication.  For diuresis he is on 40 mg Lasix tablets one twice a day.  We are checking fasting lipid panel hepatic function panel basal metabolic panel as well as a CBC and a B. natruretic peptide today.  Recheck in 4 months for office visit and fasting labs.

## 2011-01-08 ENCOUNTER — Telehealth: Payer: Self-pay | Admitting: *Deleted

## 2011-01-08 NOTE — Telephone Encounter (Signed)
Advised wife of labs 

## 2011-01-08 NOTE — Telephone Encounter (Signed)
Message copied by Burnell Blanks on Fri Jan 08, 2011  5:25 PM ------      Message from: Cassell Clement      Created: Wed Jan 06, 2011  7:54 PM       Please report.  The lipids are good.  The total cholesterol is 119.  The liver tests are good.  The blood sugar is better at 104 and the kidney function is stable.  The CBC is normal and there is no anemia and the white count is normal.      The B. natruretic peptide has increased further and is elevated at 416 suggesting congestive heart failure as the cause of his shortness of breath.  I want him to increase his furosemide to 80 mg in the morning and 40 mg in the mid afternoon for a total of 120 mg daily and see if his shortness of breath improves.

## 2011-05-05 ENCOUNTER — Ambulatory Visit (INDEPENDENT_AMBULATORY_CARE_PROVIDER_SITE_OTHER): Payer: Medicare Other | Admitting: Cardiology

## 2011-05-05 ENCOUNTER — Other Ambulatory Visit: Payer: Medicare Other

## 2011-05-05 ENCOUNTER — Ambulatory Visit (INDEPENDENT_AMBULATORY_CARE_PROVIDER_SITE_OTHER): Payer: Medicare Other | Admitting: Pharmacist

## 2011-05-05 ENCOUNTER — Encounter: Payer: Self-pay | Admitting: Cardiology

## 2011-05-05 VITALS — BP 110/70 | HR 80 | Ht 72.0 in | Wt 225.0 lb

## 2011-05-05 DIAGNOSIS — I4891 Unspecified atrial fibrillation: Secondary | ICD-10-CM

## 2011-05-05 DIAGNOSIS — I509 Heart failure, unspecified: Secondary | ICD-10-CM

## 2011-05-05 DIAGNOSIS — R0600 Dyspnea, unspecified: Secondary | ICD-10-CM

## 2011-05-05 DIAGNOSIS — R0989 Other specified symptoms and signs involving the circulatory and respiratory systems: Secondary | ICD-10-CM

## 2011-05-05 DIAGNOSIS — Z7901 Long term (current) use of anticoagulants: Secondary | ICD-10-CM

## 2011-05-05 DIAGNOSIS — I5032 Chronic diastolic (congestive) heart failure: Secondary | ICD-10-CM

## 2011-05-05 DIAGNOSIS — I119 Hypertensive heart disease without heart failure: Secondary | ICD-10-CM

## 2011-05-05 NOTE — Assessment & Plan Note (Signed)
The patient has had chronic unsteadiness of gait since his previous stroke.  The patient is not having any headaches.  He is not having any severe dizziness or syncope.

## 2011-05-05 NOTE — Assessment & Plan Note (Signed)
The patient has not been experiencing any orthopnea or evidence of severe fluid retention.  His weight today does not match up with the previously recorded weight.  I suspect that today his weight is the correct one.

## 2011-05-05 NOTE — Assessment & Plan Note (Signed)
In going over his records today it was discovered that the patient has not had a prothrombin time checked since July.  He went to the Coumadin clinic today and his INR fortunately was still therapeutic.  The patient apparently had never transitioned into the new Coumadin clinic when offices merged

## 2011-05-05 NOTE — Patient Instructions (Signed)
Your physician recommends that you continue on your current medications as directed. Please refer to the Current Medication list given to you today.  Your physician recommends that you schedule a follow-up appointment in: 4 months with Dr. Patty Sermons.  You will be seeing the coumadin clinic today.   They will monitor and dose your coumadin for you.

## 2011-05-05 NOTE — Progress Notes (Signed)
[name] Date of Birth:  Nov 24, 1925 [facility] [address] [place], [place]  [address] [phone]         Fax   [phone]  History of Present Illness: This pleasant 78 year old gentleman is seen for a scheduled four-month followup office visit.  He has a history of established chronic atrial fibrillation.  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  The patient has a history of chronic diastolic congestive heart failure.  The patient has not had cardiac catheterization.  He had a adenosine Cardiolite stress test in December 2009 showing mild left ventricular systolic dysfunction and there was evidence of an old scar of the septum and a larger apical scar but no reversible ischemia.  An echocardiogram in November 2009 showed a ejection fraction of 55-60% and mild aortic stenosis and mild mitral regurgitation and mild pulmonary hypertension.  Since last visit the patient has been doing reasonably well with no new cardiac symptoms.  Current Outpatient Prescriptions  Medication Sig Dispense Refill  . aspirin 81 MG tablet Take 81 mg by mouth daily.        . cholecalciferol (VITAMIN D) 1000 UNITS tablet Take 1,000 Units by mouth daily. Taking 10,000 daily      . Coenzyme Q10 (CO Q 10 PO) Take by mouth.        . digoxin (LANOXIN) 0.125 MG tablet Take 1 tablet (125 mcg total) by mouth daily. Taking every other day  45 tablet  3  . finasteride (PROSCAR) 5 MG tablet Take 5 mg by mouth daily.        . furosemide (LASIX) 40 MG tablet Take 40 mg by mouth as directed. 2 tablets in am and 1 mid afternoon      . lisinopril (PRINIVIL,ZESTRIL) 20 MG tablet Take 1 tablet (20 mg total) by mouth daily.  90 tablet  3  . lisinopril-hydrochlorothiazide (PRINZIDE,ZESTORETIC) 20-12.5 MG per tablet Take 1 tablet by mouth daily.  90 tablet  3  . metoprolol tartrate (LOPRESSOR) 25 MG tablet 1/2 daily  90 tablet  3  . Multiple Vitamin  (MULTIVITAMIN PO) Take by mouth.        . nitroGLYCERIN (NITROSTAT) 0.4 MG SL tablet Place 0.4 mg under the tongue every 5 (five) minutes as needed.        Marland Kitchen oxybutynin (DITROPAN) 5 MG tablet Take 5 mg by mouth daily. Taking daily      . pravastatin (PRAVACHOL) 40 MG tablet Take 20 mg by mouth every evening.        . warfarin (COUMADIN) 5 MG tablet ALTERNATE TAKING ONE TABLET ONCE DAILY AND TAKING TWO TABLETS BY MOUTH EVERY OTHER DAY  135 tablet  prn  . DISCONTD: pravastatin (PRAVACHOL) 40 MG tablet Take 1 tablet (40 mg total) by mouth every evening.  90 tablet  3    Allergies  Allergen Reactions  . Lotensin     lethargic    Patient Active Problem List  Diagnoses  . Atrial fibrillation  . Encounter for long-term (current) use of anticoagulants  . Right bundle branch block  . Cerebrovascular accident, embolic  . Aortic stenosis, mild  . Chronic diastolic congestive heart failure  . Hypercholesterolemia  . BPH (benign prostatic hyperplasia)  . Benign hypertensive heart disease without heart failure  . Dizziness - light-headed    History  Smoking status  . Former Smoker  Smokeless tobacco  . Not on file  History  Alcohol Use: Not on file    Family History  Problem Relation Age of Onset  . Heart disease Sister   . Cancer Brother     Review of Systems: Constitutional: no fever chills diaphoresis or fatigue or change in weight.  Head and neck: no hearing loss, no epistaxis, no photophobia or visual disturbance. Respiratory: No cough, shortness of breath or wheezing. Cardiovascular: No chest pain peripheral edema, palpitations. Gastrointestinal: No abdominal distention, no abdominal pain, no change in bowel habits hematochezia or melena. Genitourinary: No dysuria, no frequency, no urgency, no nocturia. Musculoskeletal:No arthralgias, no back pain, no gait disturbance or myalgias. Neurological: No dizziness, no headaches, no numbness, no seizures, no syncope, no  weakness, no tremors. Hematologic: No lymphadenopathy, no easy bruising. Psychiatric: No confusion, no hallucinations, no sleep disturbance.    Physical Exam: Filed Vitals:   05/05/11 1023  BP: 110/70  Pulse: 80   the general appearance reveals a well-developed elderly gentleman in no distress.  He has expressive aphasia and difficulty finding the right word.Pupils equal and reactive.   Extraocular Movements are full.  There is no scleral icterus.  The mouth and pharynx are normal.  The neck is supple.  The carotids reveal no bruits.  The jugular venous pressure is normal.  The thyroid is not enlarged.  There is no lymphadenopathy.  The chest is clear to percussion and auscultation. There are no rales or rhonchi. Expansion of the chest is symmetrical.  The precordium is quiet.  The first heart sound is normal.  The second heart sound is physiologically split.  There is no murmur gallop rub or click.  There is no abnormal lift or heave.  Rhythm is irregularThe abdomen is soft and nontender. Bowel sounds are normal. The liver and spleen are not enlarged. There Are no abdominal masses. There are no bruits.  The pedal pulses are good.  There is no phlebitis or edema.  There is no cyanosis or clubbing. Neurologic exam reveals poor balance when he walks.  He does do fine with the rolling walker however The skin is warm and dry.  There is no rash.      Assessment / Plan:  Continue present medication.  Recheck in 4 months for followup office visit and EKG.  He is getting fasting labs today.

## 2011-06-08 ENCOUNTER — Other Ambulatory Visit: Payer: Self-pay | Admitting: Cardiology

## 2011-06-09 ENCOUNTER — Ambulatory Visit (INDEPENDENT_AMBULATORY_CARE_PROVIDER_SITE_OTHER): Payer: Medicare Other | Admitting: Pharmacist

## 2011-06-09 DIAGNOSIS — Z7901 Long term (current) use of anticoagulants: Secondary | ICD-10-CM

## 2011-06-09 DIAGNOSIS — I4891 Unspecified atrial fibrillation: Secondary | ICD-10-CM

## 2011-06-09 LAB — POCT INR: INR: 2.7

## 2011-06-23 ENCOUNTER — Other Ambulatory Visit: Payer: Self-pay | Admitting: *Deleted

## 2011-06-23 ENCOUNTER — Other Ambulatory Visit: Payer: Self-pay | Admitting: Cardiology

## 2011-06-23 MED ORDER — FUROSEMIDE 40 MG PO TABS: ORAL_TABLET | ORAL | Status: DC

## 2011-06-23 NOTE — Telephone Encounter (Signed)
Sent new Rx for Lasix with correct directions and new quantity

## 2011-08-01 ENCOUNTER — Other Ambulatory Visit: Payer: Self-pay | Admitting: Cardiology

## 2011-08-02 NOTE — Telephone Encounter (Signed)
Refilled digoxin 

## 2011-08-05 ENCOUNTER — Other Ambulatory Visit: Payer: Self-pay | Admitting: Cardiology

## 2011-08-05 NOTE — Telephone Encounter (Signed)
Refilled pravastatin. 

## 2011-09-03 ENCOUNTER — Ambulatory Visit (INDEPENDENT_AMBULATORY_CARE_PROVIDER_SITE_OTHER): Payer: Medicare Other | Admitting: Cardiology

## 2011-09-03 ENCOUNTER — Encounter: Payer: Self-pay | Admitting: Cardiology

## 2011-09-03 VITALS — BP 120/76 | HR 60 | Ht 72.0 in | Wt 231.0 lb

## 2011-09-03 DIAGNOSIS — E7800 Pure hypercholesterolemia, unspecified: Secondary | ICD-10-CM

## 2011-09-03 DIAGNOSIS — I509 Heart failure, unspecified: Secondary | ICD-10-CM

## 2011-09-03 DIAGNOSIS — R42 Dizziness and giddiness: Secondary | ICD-10-CM

## 2011-09-03 DIAGNOSIS — I4891 Unspecified atrial fibrillation: Secondary | ICD-10-CM

## 2011-09-03 DIAGNOSIS — I5032 Chronic diastolic (congestive) heart failure: Secondary | ICD-10-CM

## 2011-09-03 NOTE — Progress Notes (Signed)
[name] Date of Birth:  1925-12-01 [facility] [address] [place], [place]  [address] [phone]         Fax   [phone]  History of Present Illness: This pleasant 78 year old gentleman is seen for a four-month followup office visit.  He has a history of established chronic atrial fibrillation.  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  He also has a history of chronic diastolic congestive heart failure.  He has not had a cardiac catheterization.  He does not have any history of ischemic heart disease.  However an adenosine Cardiolite stress test in December 2009 showed evidence of an old scar of the septum and a larger apical scar but no reversible ischemia.  The patient had an echocardiogram in November 2009 showing an ejection fraction 55-60% with mild aortic stenosis and mild pulmonary hypertension and mild mitral regurgitation.  The patient is on long-term Coumadin for his chronic atrial fibrillation.  Current Outpatient Prescriptions  Medication Sig Dispense Refill  . aspirin 81 MG tablet Take 81 mg by mouth daily.        . cholecalciferol (VITAMIN D) 1000 UNITS tablet Take 1,000 Units by mouth daily. Taking 10,000 daily      . Coenzyme Q10 (CO Q 10 PO) Take by mouth.        . digoxin (LANOXIN) 0.125 MG tablet TAKE ONE TABLET BY MOUTH EVERY OTHER DAY  45 tablet  3  . finasteride (PROSCAR) 5 MG tablet Take 5 mg by mouth daily.        . furosemide (LASIX) 40 MG tablet 2 tablets in the morning and 1 in the evening  270 tablet  3  . lisinopril (PRINIVIL,ZESTRIL) 20 MG tablet TAKE ONE TABLET BY MOUTH EVERY DAY  90 tablet  1  . lisinopril-hydrochlorothiazide (PRINZIDE,ZESTORETIC) 20-12.5 MG per tablet TAKE ONE TABLET BY MOUTH EVERY DAY  90 tablet  1  . metoprolol tartrate (LOPRESSOR) 25 MG tablet 1/2 daily  90 tablet  3  . Multiple Vitamin (MULTIVITAMIN PO) Take by mouth.        . nitroGLYCERIN (NITROSTAT) 0.4 MG SL  tablet Place 0.4 mg under the tongue every 5 (five) minutes as needed.        Marland Kitchen oxybutynin (DITROPAN) 5 MG tablet Take 5 mg by mouth daily. Taking daily      . pravastatin (PRAVACHOL) 40 MG tablet TAKE ONE-HALF TABLET BY MOUTH EVERY DAY  45 tablet  3  . warfarin (COUMADIN) 5 MG tablet ALTERNATE TAKING ONE TABLET ONCE DAILY AND TAKING TWO TABLETS BY MOUTH EVERY OTHER DAY  135 tablet  prn    Allergies  Allergen Reactions  . Benazepril Hcl     lethargic    Patient Active Problem List  Diagnosis  . Atrial fibrillation  . Encounter for long-term (current) use of anticoagulants  . Right bundle branch block  . Cerebrovascular accident, embolic  . Aortic stenosis, mild  . Chronic diastolic congestive heart failure  . Hypercholesterolemia  . BPH (benign prostatic hyperplasia)  . Benign hypertensive heart disease without heart failure  . Dizziness - light-headed    History  Smoking status  . Former Smoker  Smokeless tobacco  . Not on file    History  Alcohol Use: Not on file    Family History  Problem Relation Age of Onset  . Heart disease Sister   . Cancer Brother     Review of Systems: Constitutional: no  fever chills diaphoresis or fatigue or change in weight.  Head and neck: no hearing loss, no epistaxis, no photophobia or visual disturbance. Respiratory: No cough, shortness of breath or wheezing. Cardiovascular: No chest pain peripheral edema, palpitations. Gastrointestinal: No abdominal distention, no abdominal pain, no change in bowel habits hematochezia or melena. Genitourinary: No dysuria, no frequency, no urgency, no nocturia. Musculoskeletal:No arthralgias, no back pain, no gait disturbance or myalgias. Neurological: No dizziness, no headaches, no numbness, no seizures, no syncope, no weakness, no tremors. Hematologic: No lymphadenopathy, no easy bruising. Psychiatric: No confusion, no hallucinations, no sleep disturbance.    Physical Exam: Filed Vitals:    09/03/11 1025  BP: 120/76  Pulse: 60   the general appearance reveals a well-developed elderly gentleman in no acute distress.Pupils equal and reactive.   Extraocular Movements are full.  There is no scleral icterus.  The mouth and pharynx are normal.  The neck is supple.  The carotids reveal no bruits.  The jugular venous pressure is normal.  The thyroid is not enlarged.  There is no lymphadenopathy.  The chest is clear to percussion and auscultation. There are no rales or rhonchi. Expansion of the chest is symmetrical.  The heart reveals a soft systolic ejection murmur at the base.  No diastolic murmur.  The rhythm is irregular.  The abdomen is soft and nontender. Bowel sounds are normal. The liver and spleen are not enlarged. There Are no abdominal masses. There are no bruits.  The pedal pulses are good.  There is no phlebitis or edema.  There is no cyanosis or clubbing. Strength is normal and symmetrical in all extremities.  There is no lateralizing weakness.  There are no sensory deficits.  He does have persistent expressive aphasia.     Assessment / Plan:  Continue on same medication.  Return in 4 months for followup office visit EKG and fasting lipid panel hepatic function panel and basal metabolic panel.

## 2011-09-03 NOTE — Assessment & Plan Note (Signed)
His previous dizziness has improved since we adjusted his medicines to allow a faster ventricular response to his atrial fibrillation.

## 2011-09-03 NOTE — Assessment & Plan Note (Signed)
The patient denies any recent worsening of his chronic exertional dyspnea.  He has had no further TIA or stroke symptoms.  He is sedentary at home

## 2011-09-03 NOTE — Patient Instructions (Addendum)
Your physician recommends that you continue on your current medications as directed. Please refer to the Current Medication list given to you today. Your physician recommends that you schedule a follow-up appointment in: 4 months with fasting labs (lp/bmet/hfp/EKG)   

## 2011-09-03 NOTE — Assessment & Plan Note (Signed)
No significant peripheral edema now.  No orthopnea or paroxysmal nocturnal dyspnea.

## 2011-10-24 ENCOUNTER — Other Ambulatory Visit: Payer: Self-pay | Admitting: Cardiology

## 2011-10-27 ENCOUNTER — Ambulatory Visit (INDEPENDENT_AMBULATORY_CARE_PROVIDER_SITE_OTHER): Payer: Medicare Other | Admitting: *Deleted

## 2011-10-27 DIAGNOSIS — Z7901 Long term (current) use of anticoagulants: Secondary | ICD-10-CM

## 2011-10-27 DIAGNOSIS — I4891 Unspecified atrial fibrillation: Secondary | ICD-10-CM

## 2011-10-27 MED ORDER — WARFARIN SODIUM 5 MG PO TABS: 5.0000 mg | ORAL_TABLET | ORAL | Status: DC

## 2011-12-05 ENCOUNTER — Other Ambulatory Visit: Payer: Self-pay | Admitting: Cardiology

## 2011-12-06 ENCOUNTER — Other Ambulatory Visit: Payer: Self-pay | Admitting: Cardiology

## 2011-12-13 ENCOUNTER — Ambulatory Visit (INDEPENDENT_AMBULATORY_CARE_PROVIDER_SITE_OTHER): Payer: Medicare Other | Admitting: *Deleted

## 2011-12-13 DIAGNOSIS — I4891 Unspecified atrial fibrillation: Secondary | ICD-10-CM

## 2011-12-13 DIAGNOSIS — Z7901 Long term (current) use of anticoagulants: Secondary | ICD-10-CM

## 2011-12-13 LAB — POCT INR: INR: 2.9

## 2011-12-21 ENCOUNTER — Other Ambulatory Visit: Payer: Self-pay | Admitting: *Deleted

## 2011-12-21 MED ORDER — LISINOPRIL 20 MG PO TABS: 20.0000 mg | ORAL_TABLET | Freq: Every day | ORAL | Status: DC

## 2011-12-31 ENCOUNTER — Other Ambulatory Visit (INDEPENDENT_AMBULATORY_CARE_PROVIDER_SITE_OTHER): Payer: Medicare Other

## 2011-12-31 ENCOUNTER — Encounter: Payer: Self-pay | Admitting: Cardiology

## 2011-12-31 ENCOUNTER — Ambulatory Visit (INDEPENDENT_AMBULATORY_CARE_PROVIDER_SITE_OTHER): Payer: Medicare Other | Admitting: Cardiology

## 2011-12-31 ENCOUNTER — Ambulatory Visit (INDEPENDENT_AMBULATORY_CARE_PROVIDER_SITE_OTHER): Payer: Medicare Other

## 2011-12-31 VITALS — BP 130/68 | HR 54 | Resp 18 | Ht 72.0 in | Wt 223.0 lb

## 2011-12-31 DIAGNOSIS — Z7901 Long term (current) use of anticoagulants: Secondary | ICD-10-CM

## 2011-12-31 DIAGNOSIS — E7800 Pure hypercholesterolemia, unspecified: Secondary | ICD-10-CM

## 2011-12-31 DIAGNOSIS — I5032 Chronic diastolic (congestive) heart failure: Secondary | ICD-10-CM

## 2011-12-31 DIAGNOSIS — I509 Heart failure, unspecified: Secondary | ICD-10-CM

## 2011-12-31 DIAGNOSIS — R0989 Other specified symptoms and signs involving the circulatory and respiratory systems: Secondary | ICD-10-CM

## 2011-12-31 DIAGNOSIS — I119 Hypertensive heart disease without heart failure: Secondary | ICD-10-CM

## 2011-12-31 DIAGNOSIS — I4891 Unspecified atrial fibrillation: Secondary | ICD-10-CM

## 2011-12-31 LAB — POCT INR: INR: 3.5

## 2011-12-31 MED ORDER — WARFARIN SODIUM 5 MG PO TABS: ORAL_TABLET | ORAL | Status: DC

## 2011-12-31 NOTE — Assessment & Plan Note (Signed)
Blood pressure was remaining stable on current therapy.  No dizziness or syncope.  No headaches 

## 2011-12-31 NOTE — Progress Notes (Signed)
[name] Date of Birth:  12-06-1925 [facility] [address] [place], [place]  [address] [phone]         Fax   [phone]  History of Present Illness: This pleasant 78 year old gentleman is seen for a four-month followup office visit. He has a history of established chronic atrial fibrillation. 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 He also has a history of chronic diastolic congestive heart failure. He has not had a cardiac catheterization. He does not have any history of ischemic heart disease. However an adenosine Cardiolite stress test in December 2009 showed evidence of an old scar of the septum and a larger apical scar but no reversible ischemia. The patient had an echocardiogram in November 2009 showing an ejection fraction 55-60% with mild aortic stenosis and mild pulmonary hypertension and mild mitral regurgitation. The patient is on long-term Coumadin for his chronic atrial fibrillation.  He does not like to come frequently to get his INRs checked.   Current Outpatient Prescriptions  Medication Sig Dispense Refill  . aspirin 81 MG tablet Take 81 mg by mouth daily.        . cholecalciferol (VITAMIN D) 1000 UNITS tablet Take 1,000 Units by mouth daily. Taking 10,000 daily      . Coenzyme Q10 (CO Q 10 PO) Take by mouth.        . digoxin (LANOXIN) 0.125 MG tablet TAKE ONE TABLET BY MOUTH EVERY OTHER DAY  45 tablet  3  . finasteride (PROSCAR) 5 MG tablet Take 5 mg by mouth daily.        . furosemide (LASIX) 40 MG tablet 2 tablets in the morning and 1 in the evening  270 tablet  3  . lisinopril (PRINIVIL,ZESTRIL) 20 MG tablet Take 1 tablet (20 mg total) by mouth daily.  90 tablet  1  . lisinopril-hydrochlorothiazide (PRINZIDE,ZESTORETIC) 20-12.5 MG per tablet TAKE ONE TABLET BY MOUTH EVERY DAY  90 tablet  1  . metoprolol tartrate (LOPRESSOR) 25 MG tablet TAKE ONE-HALF TABLET BY MOUTH EVERY DAY  90 tablet  5  .  Multiple Vitamin (MULTIVITAMIN PO) Take by mouth.        . nitroGLYCERIN (NITROSTAT) 0.4 MG SL tablet Place 0.4 mg under the tongue every 5 (five) minutes as needed.        Marland Kitchen oxybutynin (DITROPAN) 5 MG tablet Take 5 mg by mouth daily. Taking daily      . pravastatin (PRAVACHOL) 40 MG tablet TAKE ONE-HALF TABLET BY MOUTH EVERY DAY  45 tablet  3  . [DISCONTINUED] warfarin (COUMADIN) 5 MG tablet Take 1 tablet (5 mg total) by mouth as directed.  100 tablet  0  . warfarin (COUMADIN) 5 MG tablet Take as directed by anticoagulation clinic  100 tablet  0    Allergies  Allergen Reactions  . Benazepril Hcl     lethargic    Patient Active Problem List  Diagnosis  . Atrial fibrillation  . Encounter for long-term (current) use of anticoagulants  . Right bundle branch block  . Cerebrovascular accident, embolic  . Aortic stenosis, mild  . Chronic diastolic congestive heart failure  . Hypercholesterolemia  . BPH (benign prostatic hyperplasia)  . Benign hypertensive heart disease without heart failure  . Dizziness - light-headed    History  Smoking status  . Former Smoker  Smokeless tobacco  . Not on file    History  Alcohol Use: Not on file  Family History  Problem Relation Age of Onset  . Heart disease Sister   . Cancer Brother     Review of Systems: Constitutional: no fever chills diaphoresis or fatigue or change in weight.  Head and neck: no hearing loss, no epistaxis, no photophobia or visual disturbance. Respiratory: No cough, shortness of breath or wheezing. Cardiovascular: No chest pain peripheral edema, palpitations. Gastrointestinal: No abdominal distention, no abdominal pain, no change in bowel habits hematochezia or melena. Genitourinary: No dysuria, no frequency, no urgency, no nocturia. Musculoskeletal:No arthralgias, no back pain, no gait disturbance or myalgias. Neurological: No dizziness, no headaches, no numbness, no seizures, no syncope, no weakness, no  tremors. Hematologic: No lymphadenopathy, no easy bruising. Psychiatric: No confusion, no hallucinations, no sleep disturbance.    Physical Exam: Filed Vitals:   12/31/11 0940  BP: 130/68  Pulse: 54  Resp: 18   the general appearance reveals a large gentleman in no distress.  He has a significant residual expressive aphasia.  He speaks fairly well as long as he does not get in a hurry.The head and neck exam reveals pupils equal and reactive.  Extraocular movements are full.  There is no scleral icterus.  The mouth and pharynx are normal.  The neck is supple.  The carotids reveal no bruits.  The jugular venous pressure is normal.  The  thyroid is not enlarged.  There is no lymphadenopathy.  The chest is clear to percussion and auscultation.  There are no rales or rhonchi.  Expansion of the chest is symmetrical.  The precordium is quiet.  The rhythm is irregularly irregular The first heart sound is normal.  The second heart sound is physiologically split.  There is a soft systolic ejection murmur at the base.  There is no abnormal lift or heave.  The abdomen is soft and nontender.  The bowel sounds are normal.  The liver and spleen are not enlarged.  There are no abdominal masses.  There are no abdominal bruits.  Extremities reveal good pedal pulses.  There is no phlebitis or edema.  There is no cyanosis or clubbing.  Strength is normal and symmetrical in all extremities.  There is no lateralizing weakness.  There are no sensory deficits.  The skin is warm and dry.  There is no rash.    Assessment / Plan: Continue on same medication.  He needs a new prescription for his warfarin.  His INR today was above 3.0.  He will return in 4 months for followup office visit lipid panel hepatic function panel and basal metabolic

## 2011-12-31 NOTE — Assessment & Plan Note (Signed)
Patient has a history of hypercholesterolemia and is on pravastatin.  Blood work today is pending.  He is not having any myalgias from the statin therapy

## 2011-12-31 NOTE — Assessment & Plan Note (Signed)
The patient has been watching his diet and watching his salt.  His weight is down 8 pounds since last visit.  He is breathing well and is able to walk better.  He walks in the yard when the weather is good and he walks to his mailbox.  He sleeps well at night and has nocturia only one time.

## 2011-12-31 NOTE — Patient Instructions (Addendum)
Will have you get your Coumadin check today  Will obtain labs today and call you with the results (lp/bmet/hfp)  Your physician recommends that you continue on your current medications as directed. Please refer to the Current Medication list given to you today.  Your physician recommends that you schedule a follow-up appointment in: 4 months with fasting labs (lp/bmet/hfp)

## 2011-12-31 NOTE — Assessment & Plan Note (Signed)
The patient has had no new TIA or stroke symptoms. 

## 2012-01-21 ENCOUNTER — Ambulatory Visit (INDEPENDENT_AMBULATORY_CARE_PROVIDER_SITE_OTHER): Payer: Medicare Other

## 2012-01-21 DIAGNOSIS — I4891 Unspecified atrial fibrillation: Secondary | ICD-10-CM

## 2012-01-21 DIAGNOSIS — Z7901 Long term (current) use of anticoagulants: Secondary | ICD-10-CM

## 2012-01-21 LAB — POCT INR: INR: 2.3

## 2012-02-18 ENCOUNTER — Ambulatory Visit (INDEPENDENT_AMBULATORY_CARE_PROVIDER_SITE_OTHER): Payer: Medicare Other | Admitting: *Deleted

## 2012-02-18 DIAGNOSIS — I4891 Unspecified atrial fibrillation: Secondary | ICD-10-CM

## 2012-02-18 DIAGNOSIS — Z7901 Long term (current) use of anticoagulants: Secondary | ICD-10-CM

## 2012-02-18 LAB — POCT INR: INR: 2.8

## 2012-02-21 ENCOUNTER — Other Ambulatory Visit: Payer: Self-pay

## 2012-02-21 MED ORDER — WARFARIN SODIUM 5 MG PO TABS: ORAL_TABLET | ORAL | Status: DC

## 2012-03-02 ENCOUNTER — Telehealth: Payer: Self-pay | Admitting: Cardiology

## 2012-03-02 DIAGNOSIS — M7989 Other specified soft tissue disorders: Secondary | ICD-10-CM

## 2012-03-02 NOTE — Telephone Encounter (Signed)
New problem:   Wife calling   1. Patient need assistance with medication .    2.C/O Great toe is swollen.   3. . Referral to foot MD - Spencer Copland at [facility].

## 2012-03-17 ENCOUNTER — Telehealth: Payer: Self-pay | Admitting: *Deleted

## 2012-03-17 ENCOUNTER — Ambulatory Visit (INDEPENDENT_AMBULATORY_CARE_PROVIDER_SITE_OTHER): Payer: Medicare Other | Admitting: *Deleted

## 2012-03-17 ENCOUNTER — Other Ambulatory Visit (INDEPENDENT_AMBULATORY_CARE_PROVIDER_SITE_OTHER): Payer: Medicare Other

## 2012-03-17 DIAGNOSIS — Z7901 Long term (current) use of anticoagulants: Secondary | ICD-10-CM

## 2012-03-17 DIAGNOSIS — I4891 Unspecified atrial fibrillation: Secondary | ICD-10-CM

## 2012-03-17 DIAGNOSIS — M7989 Other specified soft tissue disorders: Secondary | ICD-10-CM

## 2012-03-17 DIAGNOSIS — M109 Gout, unspecified: Secondary | ICD-10-CM

## 2012-03-17 LAB — BASIC METABOLIC PANEL
GFR: 45.92 mL/min — ABNORMAL LOW (ref >60.00)
Potassium: 3.7 mEq/L (ref 3.5–5.1)
Sodium: 140 mEq/L (ref 135–145)

## 2012-03-17 LAB — URIC ACID: Uric Acid, Serum: 11.3 mg/dL — ABNORMAL HIGH (ref 4.0–7.8)

## 2012-03-17 MED ORDER — METHYLPREDNISOLONE 4 MG PO KIT: PACK | ORAL | Status: DC

## 2012-03-17 MED ORDER — ALLOPURINOL 100 MG PO TABS: 100.0000 mg | ORAL_TABLET | Freq: Every day | ORAL | Status: DC

## 2012-03-17 NOTE — Telephone Encounter (Signed)
Message copied by Burnell Blanks on Fri Mar 17, 2012  6:55 PM ------      Message from: Cassell Clement      Created: Fri Mar 17, 2012  6:44 PM       The patient has significant elevation of his uric acid which is the likely cause of his acute toe pain.  We will prescribe a Medrol Dosepak as directed and he should also start allopurinol 100 mg daily as soon as the acute pain starts to subside.  He needs to drink plenty of water.  He should avoid foods that are high in purines.

## 2012-03-17 NOTE — Telephone Encounter (Signed)
Phone message routed to the wrong [facility].  Patient in for coumadin check. Still has pain, redness, and swelling that comes and goes in great toe.  Will check a uric acid level and bmet while here today. Discussed with  Dr. Patty Sermons and agreed

## 2012-03-17 NOTE — Telephone Encounter (Signed)
Advised wife  

## 2012-03-22 ENCOUNTER — Other Ambulatory Visit: Payer: Self-pay | Admitting: *Deleted

## 2012-03-22 MED ORDER — LISINOPRIL 20 MG PO TABS: 20.0000 mg | ORAL_TABLET | Freq: Every day | ORAL | Status: DC

## 2012-04-07 ENCOUNTER — Ambulatory Visit (INDEPENDENT_AMBULATORY_CARE_PROVIDER_SITE_OTHER): Payer: Medicare Other | Admitting: *Deleted

## 2012-04-07 DIAGNOSIS — Z7901 Long term (current) use of anticoagulants: Secondary | ICD-10-CM

## 2012-04-07 DIAGNOSIS — I4891 Unspecified atrial fibrillation: Secondary | ICD-10-CM

## 2012-05-05 ENCOUNTER — Encounter: Payer: Self-pay | Admitting: Cardiology

## 2012-05-05 ENCOUNTER — Ambulatory Visit (INDEPENDENT_AMBULATORY_CARE_PROVIDER_SITE_OTHER): Payer: Medicare Other | Admitting: Cardiology

## 2012-05-05 ENCOUNTER — Other Ambulatory Visit (INDEPENDENT_AMBULATORY_CARE_PROVIDER_SITE_OTHER): Payer: Medicare Other

## 2012-05-05 ENCOUNTER — Ambulatory Visit (INDEPENDENT_AMBULATORY_CARE_PROVIDER_SITE_OTHER): Payer: Medicare Other | Admitting: *Deleted

## 2012-05-05 VITALS — BP 118/62 | HR 93 | Ht 72.0 in | Wt 225.8 lb

## 2012-05-05 DIAGNOSIS — Z7901 Long term (current) use of anticoagulants: Secondary | ICD-10-CM

## 2012-05-05 DIAGNOSIS — I359 Nonrheumatic aortic valve disorder, unspecified: Secondary | ICD-10-CM

## 2012-05-05 DIAGNOSIS — I4891 Unspecified atrial fibrillation: Secondary | ICD-10-CM

## 2012-05-05 DIAGNOSIS — M109 Gout, unspecified: Secondary | ICD-10-CM

## 2012-05-05 DIAGNOSIS — R0989 Other specified symptoms and signs involving the circulatory and respiratory systems: Secondary | ICD-10-CM

## 2012-05-05 DIAGNOSIS — E7800 Pure hypercholesterolemia, unspecified: Secondary | ICD-10-CM

## 2012-05-05 DIAGNOSIS — I350 Nonrheumatic aortic (valve) stenosis: Secondary | ICD-10-CM

## 2012-05-05 DIAGNOSIS — I119 Hypertensive heart disease without heart failure: Secondary | ICD-10-CM

## 2012-05-05 LAB — LIPID PANEL
Cholesterol: 132 mg/dL (ref 0–200)
LDL Cholesterol: 69 mg/dL (ref 0–99)
Triglycerides: 146 mg/dL (ref 0.0–149.0)

## 2012-05-05 LAB — BASIC METABOLIC PANEL
Calcium: 9.8 mg/dL (ref 8.4–10.5)
Chloride: 101 mEq/L (ref 96–112)
Creatinine, Ser: 1.4 mg/dL (ref 0.4–1.5)

## 2012-05-05 LAB — HEPATIC FUNCTION PANEL
AST: 22 U/L (ref 0–37)
Albumin: 4 g/dL (ref 3.5–5.2)
Alkaline Phosphatase: 42 U/L (ref 39–117)
Total Protein: 7.1 g/dL (ref 6.0–8.3)

## 2012-05-05 NOTE — Assessment & Plan Note (Signed)
He is not having any symptoms referable to his mild aortic stenosis

## 2012-05-05 NOTE — Progress Notes (Signed)
[name] Date of Birth:  1925-05-18 [facility] [address] [place], [place]  [address] [phone]         Fax   [phone]  History of Present Illness: This pleasant 79 year old gentleman is seen for a four-month followup office visit. He has a history of established chronic atrial fibrillation. 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 He also has a history of chronic diastolic congestive heart failure. He has not had a cardiac catheterization. He does not have any history of ischemic heart disease. However an adenosine Cardiolite stress test in December 2009 showed evidence of an old scar of the septum and a larger apical scar but no reversible ischemia. The patient had an echocardiogram in November 2009 showing an ejection fraction 55-60% with mild aortic stenosis and mild pulmonary hypertension and mild mitral regurgitation. The patient is on long-term Coumadin for his chronic atrial fibrillation. He does not like to come frequently to get his INRs checked.  Since last visit he's had no new cardiac symptoms.   Current Outpatient Prescriptions  Medication Sig Dispense Refill  . allopurinol (ZYLOPRIM) 100 MG tablet Take 1 tablet (100 mg total) by mouth daily.  30 tablet  5  . aspirin 81 MG tablet Take 81 mg by mouth daily.        . cholecalciferol (VITAMIN D) 1000 UNITS tablet Take 1,000 Units by mouth daily. Taking 10,000 daily      . COD LIVER OIL PO Take by mouth.      . Coenzyme Q10 (CO Q 10 PO) Take by mouth.        . digoxin (LANOXIN) 0.125 MG tablet TAKE ONE TABLET BY MOUTH EVERY OTHER DAY  45 tablet  3  . finasteride (PROSCAR) 5 MG tablet Take 5 mg by mouth daily.        . furosemide (LASIX) 40 MG tablet 2 tablets in the morning and 1 in the evening  270 tablet  3  . GLUCOSAMINE-CHONDROITIN ER PO Take by mouth.      Marland Kitchen lisinopril (PRINIVIL,ZESTRIL) 20 MG tablet Take 1 tablet (20 mg total) by mouth daily.  90  tablet  1  . lisinopril-hydrochlorothiazide (PRINZIDE,ZESTORETIC) 20-12.5 MG per tablet TAKE ONE TABLET BY MOUTH EVERY DAY  90 tablet  1  . metoprolol tartrate (LOPRESSOR) 25 MG tablet TAKE ONE-HALF TABLET BY MOUTH EVERY DAY  90 tablet  5  . Multiple Vitamin (MULTIVITAMIN PO) Take by mouth.        . Multiple Vitamins-Minerals (MACULAR VITAMIN BENEFIT PO) Take by mouth.      . nitroGLYCERIN (NITROSTAT) 0.4 MG SL tablet Place 0.4 mg under the tongue every 5 (five) minutes as needed.        . Omega-3 Fatty Acids (FISH OIL) 1000 MG CAPS Take by mouth daily.      Marland Kitchen oxybutynin (DITROPAN) 5 MG tablet Take 5 mg by mouth daily. Taking daily      . pravastatin (PRAVACHOL) 40 MG tablet TAKE ONE-HALF TABLET BY MOUTH EVERY DAY  45 tablet  3  . warfarin (COUMADIN) 5 MG tablet Take as directed by anticoagulation clinic  100 tablet  1   No current facility-administered medications for this visit.    Allergies  Allergen Reactions  . Benazepril Hcl     lethargic    Patient Active Problem List  Diagnosis  . Atrial fibrillation  . Encounter for long-term (current) use of anticoagulants  . Right bundle  branch block  . Cerebrovascular accident, embolic  . Aortic stenosis, mild  . Chronic diastolic congestive heart failure  . Hypercholesterolemia  . BPH (benign prostatic hyperplasia)  . Benign hypertensive heart disease without heart failure  . Dizziness - light-headed    History  Smoking status  . Former Smoker  Smokeless tobacco  . Not on file    History  Alcohol Use: Not on file    Family History  Problem Relation Age of Onset  . Heart disease Sister   . Cancer Brother     Review of Systems: Constitutional: no fever chills diaphoresis or fatigue or change in weight.  Head and neck: no hearing loss, no epistaxis, no photophobia or visual disturbance. Respiratory: No cough, shortness of breath or wheezing. Cardiovascular: No chest pain peripheral edema,  palpitations. Gastrointestinal: No abdominal distention, no abdominal pain, no change in bowel habits hematochezia or melena. Genitourinary: No dysuria, no frequency, no urgency, no nocturia. Musculoskeletal:No arthralgias, no back pain, no gait disturbance or myalgias. Neurological: No dizziness, no headaches, no numbness, no seizures, no syncope, no weakness, no tremors. Hematologic: No lymphadenopathy, no easy bruising. Psychiatric: No confusion, no hallucinations, no sleep disturbance.    Physical Exam: Filed Vitals:   05/05/12 0920  BP: 118/62  Pulse: 93   the general appearance reveals a well-developed well-nourished elderly gentleman who has a mild expressive aphasia.The head and neck exam reveals pupils equal and reactive.  Extraocular movements are full.  There is no scleral icterus.  The mouth and pharynx are normal.  The neck is supple.  The carotids reveal no bruits.  The jugular venous pressure is normal.  The  thyroid is not enlarged.  There is no lymphadenopathy.  The chest is clear to percussion and auscultation.  There are no rales or rhonchi.  Expansion of the chest is symmetrical.  The precordium is quiet.  The first heart sound is normal.  The second heart sound is physiologically split.  There is no gallop rub or click.  There is a soft systolic murmur across the aortic valve. There is no abnormal lift or heave.  The abdomen is soft and nontender.  The bowel sounds are normal.  The liver and spleen are not enlarged.  There are no abdominal masses.  There are no abdominal bruits.  Extremities reveal good pedal pulses.  There is no phlebitis or edema.  There is no cyanosis or clubbing.  Strength is normal and symmetrical in all extremities.  There is no lateralizing weakness.  There are no sensory deficits.  Speech reveals mild expressive aphasia and difficulty with word finding. The skin is warm and dry.  There is no rash.    Assessment / Plan: Continue on same medication.  I  gave him a prescription to get the shingles vaccine since his wife recently had such a bad case of it. Recheck in 4 months for followup office visit lipid panel hepatic function panel basal metabolic panel and uric acid.

## 2012-05-05 NOTE — Patient Instructions (Addendum)

## 2012-05-05 NOTE — Assessment & Plan Note (Signed)
Patient has had a previous embolic stroke.  He is on long-term Coumadin.  He has had no subsequent TIA or stroke symptoms.

## 2012-05-05 NOTE — Assessment & Plan Note (Signed)
Blood pressure has been remaining stable on current therapy.  The patient tries to walk for exercise at home.  He has a 3 wheeled walker that he uses for balance.

## 2012-05-06 NOTE — Progress Notes (Signed)
Quick Note:  Please report to patient. The recent labs are stable. Continue same medication and careful diet. The blood sugar is improved at 108. Continue to avoid too many sweets. ______

## 2012-05-09 ENCOUNTER — Telehealth: Payer: Self-pay | Admitting: *Deleted

## 2012-05-09 NOTE — Telephone Encounter (Signed)
Advised wife of labs 

## 2012-05-09 NOTE — Telephone Encounter (Signed)
Message copied by Burnell Blanks on Tue May 09, 2012  3:10 PM ------      Message from: Cassell Clement      Created: Sat May 06, 2012  4:49 PM       Please report to patient.  The recent labs are stable. Continue same medication and careful diet.  The blood sugar is improved at 108.  Continue to avoid too many sweets. ------

## 2012-05-22 ENCOUNTER — Other Ambulatory Visit: Payer: Self-pay | Admitting: *Deleted

## 2012-05-22 MED ORDER — LISINOPRIL-HYDROCHLOROTHIAZIDE 20-12.5 MG PO TABS: 1.0000 | ORAL_TABLET | Freq: Every day | ORAL | Status: DC

## 2012-06-02 ENCOUNTER — Ambulatory Visit (INDEPENDENT_AMBULATORY_CARE_PROVIDER_SITE_OTHER): Payer: Medicare Other

## 2012-06-02 DIAGNOSIS — Z7901 Long term (current) use of anticoagulants: Secondary | ICD-10-CM

## 2012-06-02 DIAGNOSIS — I4891 Unspecified atrial fibrillation: Secondary | ICD-10-CM

## 2012-06-02 LAB — POCT INR: INR: 3.3

## 2012-06-03 ENCOUNTER — Other Ambulatory Visit: Payer: Self-pay | Admitting: Cardiology

## 2012-06-19 ENCOUNTER — Other Ambulatory Visit: Payer: Self-pay | Admitting: *Deleted

## 2012-06-19 MED ORDER — FUROSEMIDE 40 MG PO TABS: ORAL_TABLET | ORAL | Status: DC

## 2012-06-22 ENCOUNTER — Ambulatory Visit (INDEPENDENT_AMBULATORY_CARE_PROVIDER_SITE_OTHER): Payer: Medicare Other | Admitting: Family Medicine

## 2012-06-22 ENCOUNTER — Encounter: Payer: Self-pay | Admitting: Family Medicine

## 2012-06-22 VITALS — BP 110/66 | HR 52 | Temp 98.2°F | Ht 67.5 in | Wt 222.8 lb

## 2012-06-22 DIAGNOSIS — R4189 Other symptoms and signs involving cognitive functions and awareness: Secondary | ICD-10-CM

## 2012-06-22 DIAGNOSIS — F09 Unspecified mental disorder due to known physiological condition: Secondary | ICD-10-CM

## 2012-06-22 DIAGNOSIS — I509 Heart failure, unspecified: Secondary | ICD-10-CM

## 2012-06-22 DIAGNOSIS — L989 Disorder of the skin and subcutaneous tissue, unspecified: Secondary | ICD-10-CM

## 2012-06-22 DIAGNOSIS — I119 Hypertensive heart disease without heart failure: Secondary | ICD-10-CM

## 2012-06-22 DIAGNOSIS — E7800 Pure hypercholesterolemia, unspecified: Secondary | ICD-10-CM

## 2012-06-22 DIAGNOSIS — I5032 Chronic diastolic (congestive) heart failure: Secondary | ICD-10-CM

## 2012-06-22 DIAGNOSIS — I6340 Cerebral infarction due to embolism of unspecified cerebral artery: Secondary | ICD-10-CM

## 2012-06-22 DIAGNOSIS — I4891 Unspecified atrial fibrillation: Secondary | ICD-10-CM

## 2012-06-22 DIAGNOSIS — I639 Cerebral infarction, unspecified: Secondary | ICD-10-CM

## 2012-06-22 NOTE — Progress Notes (Signed)
Subjective:    Patient ID: [name], male    DOB: September 27, 1925, 79 y.o.   MRN: [id number]  HPI CC: new pt to establish  Prior saw Dr. Patty Sermons (cards).  Has never had PCP.    New spot on skin of chest that developed 2-3 d ago.  Denies inciting insect bite.  Not pruritic or tender.  Cardiac history - h/o chronic atrial fibrillation, chronic diastolic CHF, HTN and HLD. Sees Dr. Patty Sermons ~ every 4 months H/o 4 CVAs (1999 through 2007), embolic.  Last one with residual cognitive impairment.    Gout - started allopurinol 3 mo ago. Lab Results  Component Value Date   CREATININE 1.4 05/05/2012    Preventative: No medicare wellnes visit in past Flu shot - does not receive "made me sick" Tetanus - unsure - will check with Dr. Patty Sermons penumovax - does not receive zostavax - does not want  Lives with wife 2 grown children live ([place] and [place]). Estranged from local son. Not close to sons. Sees grandchildren regularly. Occupation: retired, prior Personnel officer  Medications and allergies reviewed and updated in chart.  Past histories reviewed and updated if relevant as below. Patient Active Problem List   Diagnosis Date Noted  . Cognitive impairment   . Dizziness - light-headed 09/07/2010  . Atrial fibrillation 06/08/2010  . Encounter for long-term (current) use of anticoagulants 06/08/2010  . Right bundle branch block 06/08/2010  . Cerebrovascular accident, embolic 06/08/2010  . Aortic stenosis, mild 06/08/2010  . Chronic diastolic congestive heart failure 06/08/2010  . Hypercholesterolemia 06/08/2010  . BPH (benign prostatic hyperplasia) 06/08/2010  . Benign hypertensive heart disease without heart failure 06/08/2010   Past Medical History  Diagnosis Date  . CHF (congestive heart failure)   . Atrial fibrillation   . CAD (coronary artery disease)     s/p MI, unsure when  . CVA (cerebrovascular accident) x4 (latest 2007)  . DOE (dyspnea on exertion)    . Hyperlipidemia   . HTN (hypertension)   . Cognitive impairment     from stroke   Past Surgical History  Procedure Laterality Date  . Appendectomy  1994  . Hemorrhoid surgery    . Cardiovascular stress test  01/11/2008    EF 48%  . US echocardiography  12/19/2007    EF 55-60%  . Tonsillectomy  1964  . Excision morton's neuroma  1995    x 2   History  Substance Use Topics  . Smoking status: Former Smoker    Quit date: 02/08/1950  . Smokeless tobacco: Never Used  . Alcohol Use: No   Family History  Problem Relation Age of Onset  . Cancer Brother     brain  . Cancer Sister     colon  . CAD Sister   . Alcohol abuse Brother    Allergies  Allergen Reactions  . Benazepril Hcl     Lethargic, nervous, nauseated   Current Outpatient Prescriptions on File Prior to Visit  Medication Sig Dispense Refill  . allopurinol (ZYLOPRIM) 100 MG tablet Take 1 tablet (100 mg total) by mouth daily.  30 tablet  5  . aspirin 81 MG tablet Take 81 mg by mouth daily.        . cholecalciferol (VITAMIN D) 1000 UNITS tablet Take 1,000 Units by mouth daily. Taking 10,000 daily      . COD LIVER OIL PO Take by mouth.      . Coenzyme Q10 (CO Q 10 PO) Take by  mouth.        . digoxin (LANOXIN) 0.125 MG tablet TAKE ONE TABLET BY MOUTH EVERY OTHER DAY  45 tablet  3  . finasteride (PROSCAR) 5 MG tablet Take 5 mg by mouth daily.        . furosemide (LASIX) 40 MG tablet 2 tablets in the morning and 1 in the evening  270 tablet  3  . GLUCOSAMINE-CHONDROITIN ER PO Take by mouth.      . Multiple Vitamin (MULTIVITAMIN PO) Take by mouth.        . Omega-3 Fatty Acids (FISH OIL) 1000 MG CAPS Take by mouth daily.      . pravastatin (PRAVACHOL) 40 MG tablet TAKE ONE-HALF TABLET BY MOUTH EVERY DAY  45 tablet  3  . warfarin (COUMADIN) 5 MG tablet TAKE AS DIRECTED BY  ANTICOAGULATION  CLINIC  100 tablet  1  . nitroGLYCERIN (NITROSTAT) 0.4 MG SL tablet Place 0.4 mg under the tongue every 5 (five) minutes as needed.          No current facility-administered medications on file prior to visit.     Review of Systems  Constitutional: Negative for fever, chills, activity change, appetite change, fatigue and unexpected weight change.  HENT: Negative for hearing loss and neck pain.   Eyes: Negative for visual disturbance.  Respiratory: Positive for shortness of breath (with exertion). Negative for cough, chest tightness and wheezing.   Cardiovascular: Negative for chest pain, palpitations and leg swelling.  Gastrointestinal: Negative for nausea, vomiting, abdominal pain, diarrhea, constipation, blood in stool and abdominal distention.  Genitourinary: Negative for hematuria and difficulty urinating.  Musculoskeletal: Negative for myalgias and arthralgias.  Skin: Negative for rash.  Neurological: Negative for dizziness, seizures, syncope and headaches.  Hematological: Negative for adenopathy. Does not bruise/bleed easily.  Psychiatric/Behavioral: Negative for dysphoric mood. The patient is not nervous/anxious.        Objective:   Physical Exam  Nursing note and vitals reviewed. Constitutional: He is oriented to person, place, and time. He appears well-developed and well-nourished. No distress.  HENT:  Head: Normocephalic and atraumatic.  Right Ear: External ear normal.  Left Ear: External ear normal.  Nose: Nose normal.  Mouth/Throat: Oropharynx is clear and moist. No oropharyngeal exudate.  Eyes: Conjunctivae and EOM are normal. Pupils are equal, round, and reactive to light. No scleral icterus.  Neck: Normal range of motion. Neck supple. Carotid bruit is not present. No thyromegaly present.  Cardiovascular: Normal rate, normal heart sounds and intact distal pulses.  An irregular rhythm present.  No murmur heard. Pulses:      Radial pulses are 2+ on the right side, and 2+ on the left side.  Pulmonary/Chest: Effort normal and breath sounds normal. No respiratory distress. He has no wheezes. He has no  rales.  Abdominal: Soft. Bowel sounds are normal. He exhibits no distension and no mass. There is no tenderness. There is no rebound and no guarding.  Musculoskeletal: Normal range of motion. He exhibits no edema.  Lymphadenopathy:    He has no cervical adenopathy.  Neurological: He is alert and oriented to person, place, and time.  CN grossly intact, station and gait intact  Skin: Skin is warm and dry. Rash noted.  Lesion mid chest - erythematous nodule about 1.5cm diameter  Psychiatric: He has a normal mood and affect. His behavior is normal. Judgment and thought content normal.  Expressive aphasia Unsteady on feet, uses 4 prong cane       Assessment &  Plan:

## 2012-06-22 NOTE — Patient Instructions (Signed)
Return at your convenience in the next few months fasting for blood work and afterwards for initial medicare wellness visit. Good to meet you today, call us with questions. le'ts keep an eye on skin lesion.  If enlarging or changing, let me know.

## 2012-06-23 ENCOUNTER — Encounter: Payer: Self-pay | Admitting: Family Medicine

## 2012-06-23 DIAGNOSIS — L989 Disorder of the skin and subcutaneous tissue, unspecified: Secondary | ICD-10-CM | POA: Insufficient documentation

## 2012-06-23 NOTE — Assessment & Plan Note (Signed)
Chronic, stable 

## 2012-06-23 NOTE — Assessment & Plan Note (Signed)
On coumadin.  Controlled on digoxin, coumadin, aspirin, low dose metoprolol succinate

## 2012-06-23 NOTE — Assessment & Plan Note (Signed)
Chronic, stable. Continue meds. 

## 2012-06-23 NOTE — Assessment & Plan Note (Signed)
Of 1 wk duration. Seems consistent with bug bite, will monitor for now, and reassess at next office visit.

## 2012-06-23 NOTE — Assessment & Plan Note (Signed)
Continue pravastatin 

## 2012-06-23 NOTE — Assessment & Plan Note (Signed)
With residual expressive aphasia. On coumadin, aspirin.

## 2012-06-30 ENCOUNTER — Ambulatory Visit (INDEPENDENT_AMBULATORY_CARE_PROVIDER_SITE_OTHER): Payer: Medicare Other | Admitting: *Deleted

## 2012-06-30 DIAGNOSIS — Z7901 Long term (current) use of anticoagulants: Secondary | ICD-10-CM

## 2012-06-30 DIAGNOSIS — I4891 Unspecified atrial fibrillation: Secondary | ICD-10-CM

## 2012-06-30 LAB — POCT INR: INR: 4

## 2012-07-14 ENCOUNTER — Ambulatory Visit (INDEPENDENT_AMBULATORY_CARE_PROVIDER_SITE_OTHER): Payer: Medicare Other | Admitting: *Deleted

## 2012-07-14 DIAGNOSIS — I4891 Unspecified atrial fibrillation: Secondary | ICD-10-CM

## 2012-07-14 DIAGNOSIS — Z7901 Long term (current) use of anticoagulants: Secondary | ICD-10-CM

## 2012-07-23 ENCOUNTER — Other Ambulatory Visit: Payer: Self-pay | Admitting: Cardiology

## 2012-08-10 ENCOUNTER — Ambulatory Visit (INDEPENDENT_AMBULATORY_CARE_PROVIDER_SITE_OTHER): Payer: Medicare Other | Admitting: *Deleted

## 2012-08-10 DIAGNOSIS — Z7901 Long term (current) use of anticoagulants: Secondary | ICD-10-CM

## 2012-08-10 DIAGNOSIS — I4891 Unspecified atrial fibrillation: Secondary | ICD-10-CM

## 2012-09-01 ENCOUNTER — Ambulatory Visit (INDEPENDENT_AMBULATORY_CARE_PROVIDER_SITE_OTHER): Payer: Self-pay | Admitting: Cardiology

## 2012-09-01 ENCOUNTER — Ambulatory Visit (INDEPENDENT_AMBULATORY_CARE_PROVIDER_SITE_OTHER): Payer: Medicare Other | Admitting: *Deleted

## 2012-09-01 ENCOUNTER — Encounter: Payer: Self-pay | Admitting: Cardiology

## 2012-09-01 VITALS — BP 114/66 | HR 60 | Ht 72.0 in | Wt 228.8 lb

## 2012-09-01 DIAGNOSIS — E7800 Pure hypercholesterolemia, unspecified: Secondary | ICD-10-CM

## 2012-09-01 DIAGNOSIS — Z7901 Long term (current) use of anticoagulants: Secondary | ICD-10-CM

## 2012-09-01 DIAGNOSIS — I4891 Unspecified atrial fibrillation: Secondary | ICD-10-CM

## 2012-09-01 DIAGNOSIS — I509 Heart failure, unspecified: Secondary | ICD-10-CM

## 2012-09-01 DIAGNOSIS — I5032 Chronic diastolic (congestive) heart failure: Secondary | ICD-10-CM

## 2012-09-01 DIAGNOSIS — M109 Gout, unspecified: Secondary | ICD-10-CM | POA: Insufficient documentation

## 2012-09-01 DIAGNOSIS — I119 Hypertensive heart disease without heart failure: Secondary | ICD-10-CM

## 2012-09-01 MED ORDER — LISINOPRIL 20 MG PO TABS: 20.0000 mg | ORAL_TABLET | Freq: Every day | ORAL | Status: DC

## 2012-09-01 NOTE — Assessment & Plan Note (Signed)
The patient is not having any worsening of his exertional dyspnea.  No chest pain.  No orthopnea or paroxysmal nocturnal dyspnea.

## 2012-09-01 NOTE — Assessment & Plan Note (Signed)
The patient is on low-dose allopurinol.  He has not had any recent flareup of gout.

## 2012-09-01 NOTE — Patient Instructions (Addendum)
Your physician recommends that you continue on your current medications as directed. Please refer to the Current Medication list given to you today.  Your physician wants you to follow-up in: 4 months with fasting labs (lp/bmet/hfp/uric acid/cbc)   You will receive a reminder letter in the mail two months in advance. If you don't receive a letter, please call our office to schedule the follow-up appointment.

## 2012-09-01 NOTE — Assessment & Plan Note (Signed)
Patient has a history of hypercholesterolemia.  He is tolerating pravastatin without side effects.  Blood work pending.

## 2012-09-01 NOTE — Assessment & Plan Note (Signed)
Patient remains in permanent atrial fibrillation.  He is on Coumadin.  He has had no further strokes or TIA symptoms.

## 2012-09-01 NOTE — Progress Notes (Signed)
[name] Date of Birth:  Oct 16, 1925 [facility] [address] [place], [place]  [address] [phone]         Fax   [phone]  History of Present Illness: This pleasant 80 year old gentleman is seen for a four-month followup office visit. He has a history of established chronic atrial fibrillation. 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 He also has a history of chronic diastolic congestive heart failure. He has not had a cardiac catheterization. He does not have any history of ischemic heart disease. However an adenosine Cardiolite stress test in December 2009 showed evidence of an old scar of the septum and a larger apical scar but no reversible ischemia. The patient had an echocardiogram in November 2009 showing an ejection fraction 55-60% with mild aortic stenosis and mild pulmonary hypertension and mild mitral regurgitation. The patient is on long-term Coumadin for his chronic atrial fibrillation. He does not like to come frequently to get his INRs checked. Since last visit he's had no new cardiac symptoms.    Current Outpatient Prescriptions  Medication Sig Dispense Refill  . allopurinol (ZYLOPRIM) 100 MG tablet Take 1 tablet (100 mg total) by mouth daily.  30 tablet  5  . ALOE VERA CONCENTRATE PO Take 600 mg by mouth daily.      Marland Kitchen aspirin 81 MG tablet Take 81 mg by mouth daily.        . cholecalciferol (VITAMIN D) 1000 UNITS tablet Take 1,000 Units by mouth daily. Taking 10,000 daily      . COD LIVER OIL PO Take by mouth.      . Coenzyme Q10 (CO Q 10 PO) Take by mouth.        . digoxin (LANOXIN) 0.125 MG tablet TAKE ONE TABLET BY MOUTH EVERY OTHER DAY  45 tablet  3  . finasteride (PROSCAR) 5 MG tablet Take 5 mg by mouth daily.        . furosemide (LASIX) 40 MG tablet 2 tablets in the morning and 1 in the evening  270 tablet  3  . GLUCOSAMINE-CHONDROITIN ER PO Take by mouth.      Marland Kitchen lisinopril (PRINIVIL,ZESTRIL) 20 MG  tablet Take 1 tablet (20 mg total) by mouth daily. PM  90 tablet  30  . lisinopril-hydrochlorothiazide (PRINZIDE,ZESTORETIC) 20-12.5 MG per tablet Take 1 tablet by mouth daily. AM      . metoprolol succinate (TOPROL-XL) 25 MG 24 hr tablet Take 12.5 mg by mouth daily.      . Multiple Vitamin (MULTIVITAMIN PO) Take by mouth.        . Multiple Vitamins-Minerals (PRESERVISION AREDS 2) CAPS Take 1 capsule by mouth daily. Without beta carotene      . nitroGLYCERIN (NITROSTAT) 0.4 MG SL tablet Place 0.4 mg under the tongue every 5 (five) minutes as needed.        . Omega-3 Fatty Acids (FISH OIL) 1000 MG CAPS Take by mouth daily.      Marland Kitchen oxybutynin (DITROPAN-XL) 5 MG 24 hr tablet Take 5 mg by mouth daily.      . pravastatin (PRAVACHOL) 40 MG tablet TAKE ONE-HALF TABLET BY MOUTH EVERY DAY  45 tablet  2  . warfarin (COUMADIN) 5 MG tablet TAKE AS DIRECTED BY  ANTICOAGULATION  CLINIC  100 tablet  1   No current facility-administered medications for this visit.    Allergies  Allergen Reactions  . Benazepril Hcl     Lethargic, nervous, nauseated  Patient Active Problem List   Diagnosis Date Noted  . Benign hypertensive heart disease without heart failure 06/08/2010    Priority: High  . Atrial fibrillation 06/08/2010    Priority: Medium  . Chronic diastolic congestive heart failure 06/08/2010    Priority: Medium  . Skin lesion 06/23/2012  . Cognitive impairment   . Dizziness - light-headed 09/07/2010  . Encounter for long-term (current) use of anticoagulants 06/08/2010  . Right bundle branch block 06/08/2010  . Cerebrovascular accident, embolic 06/08/2010  . Aortic stenosis, mild 06/08/2010  . Hypercholesterolemia 06/08/2010  . BPH (benign prostatic hyperplasia) 06/08/2010    History  Smoking status  . Former Smoker  . Quit date: 02/08/1950  Smokeless tobacco  . Never Used    History  Alcohol Use No    Family History  Problem Relation Age of Onset  . Cancer Brother     brain    . Cancer Sister     colon  . CAD Sister   . Alcohol abuse Brother     Review of Systems: Constitutional: no fever chills diaphoresis or fatigue or change in weight.  Head and neck: no hearing loss, no epistaxis, no photophobia or visual disturbance. Respiratory: No cough, shortness of breath or wheezing. Cardiovascular: No chest pain peripheral edema, palpitations. Gastrointestinal: No abdominal distention, no abdominal pain, no change in bowel habits hematochezia or melena. Genitourinary: No dysuria, no frequency, no urgency, no nocturia. Musculoskeletal:No arthralgias, no back pain, no gait disturbance or myalgias. Neurological: No dizziness, no headaches, no numbness, no seizures, no syncope, no weakness, no tremors. Hematologic: No lymphadenopathy, no easy bruising. Psychiatric: No confusion, no hallucinations, no sleep disturbance.    Physical Exam: Filed Vitals:   09/01/12 0841  BP: 114/66  Pulse: 60   the general appearance reveals a large elderly gentleman in no distress.  There is mild expressive aphasia.The head and neck exam reveals pupils equal and reactive.  Extraocular movements are full.  There is no scleral icterus.  The mouth and pharynx are normal.  The neck is supple.  The carotids reveal no bruits.  The jugular venous pressure is normal.  The  thyroid is not enlarged.  There is no lymphadenopathy.  The chest is clear to percussion and auscultation.  There are no rales or rhonchi.  Expansion of the chest is symmetrical.  The precordium is quiet.  The first heart sound is normal.  The second heart sound is physiologically split.  There is a faint basilar systolic ejection murmur grade 1/6.  No diastolic murmur.  The pulse is irregularly irregular  There is no abnormal lift or heave.  The abdomen is soft and nontender.  The bowel sounds are normal.  The liver and spleen are not enlarged.  There are no abdominal masses.  There are no abdominal bruits.  Extremities reveal  good pedal pulses.  There is no phlebitis or edema.  There is no cyanosis or clubbing.  Strength is normal and symmetrical in all extremities.  There is no lateralizing weakness.  There are no sensory deficits.  The skin is warm and dry.  There is no rash.     Assessment / Plan: Continue on same medication.  Blood work today pending.  Recheck in 4 months for followup office visit uric acid CBC lipid panel hepatic function panel and basal metabolic panel.

## 2012-10-05 ENCOUNTER — Other Ambulatory Visit: Payer: Self-pay | Admitting: Cardiology

## 2012-10-16 ENCOUNTER — Other Ambulatory Visit: Payer: Self-pay | Admitting: *Deleted

## 2012-10-16 MED ORDER — WARFARIN SODIUM 5 MG PO TABS: 5.0000 mg | ORAL_TABLET | ORAL | Status: DC

## 2012-10-27 ENCOUNTER — Ambulatory Visit (INDEPENDENT_AMBULATORY_CARE_PROVIDER_SITE_OTHER): Payer: Medicare Other | Admitting: Pharmacist

## 2012-10-27 DIAGNOSIS — Z7901 Long term (current) use of anticoagulants: Secondary | ICD-10-CM

## 2012-10-27 DIAGNOSIS — I4891 Unspecified atrial fibrillation: Secondary | ICD-10-CM

## 2012-11-07 ENCOUNTER — Other Ambulatory Visit: Payer: Self-pay

## 2012-11-07 MED ORDER — LISINOPRIL-HYDROCHLOROTHIAZIDE 20-12.5 MG PO TABS: 1.0000 | ORAL_TABLET | Freq: Every day | ORAL | Status: DC

## 2012-12-02 ENCOUNTER — Other Ambulatory Visit: Payer: Self-pay | Admitting: Cardiology

## 2013-01-01 ENCOUNTER — Ambulatory Visit (INDEPENDENT_AMBULATORY_CARE_PROVIDER_SITE_OTHER): Payer: Medicare Other | Admitting: *Deleted

## 2013-01-01 ENCOUNTER — Encounter: Payer: Self-pay | Admitting: Cardiology

## 2013-01-01 ENCOUNTER — Ambulatory Visit (INDEPENDENT_AMBULATORY_CARE_PROVIDER_SITE_OTHER): Payer: Medicare Other | Admitting: Cardiology

## 2013-01-01 VITALS — BP 100/60 | HR 79 | Ht 72.0 in | Wt 227.0 lb

## 2013-01-01 DIAGNOSIS — I6340 Cerebral infarction due to embolism of unspecified cerebral artery: Secondary | ICD-10-CM

## 2013-01-01 DIAGNOSIS — I4891 Unspecified atrial fibrillation: Secondary | ICD-10-CM

## 2013-01-01 DIAGNOSIS — I119 Hypertensive heart disease without heart failure: Secondary | ICD-10-CM

## 2013-01-01 DIAGNOSIS — I639 Cerebral infarction, unspecified: Secondary | ICD-10-CM

## 2013-01-01 DIAGNOSIS — E7800 Pure hypercholesterolemia, unspecified: Secondary | ICD-10-CM

## 2013-01-01 DIAGNOSIS — I359 Nonrheumatic aortic valve disorder, unspecified: Secondary | ICD-10-CM

## 2013-01-01 DIAGNOSIS — I350 Nonrheumatic aortic (valve) stenosis: Secondary | ICD-10-CM

## 2013-01-01 DIAGNOSIS — I5032 Chronic diastolic (congestive) heart failure: Secondary | ICD-10-CM

## 2013-01-01 DIAGNOSIS — Z7901 Long term (current) use of anticoagulants: Secondary | ICD-10-CM

## 2013-01-01 DIAGNOSIS — I509 Heart failure, unspecified: Secondary | ICD-10-CM

## 2013-01-01 DIAGNOSIS — M109 Gout, unspecified: Secondary | ICD-10-CM

## 2013-01-01 LAB — HEPATIC FUNCTION PANEL
ALT: 23 U/L (ref 0–53)
AST: 20 U/L (ref 0–37)
Albumin: 4 g/dL (ref 3.5–5.2)
Alkaline Phosphatase: 35 U/L — ABNORMAL LOW (ref 39–117)
Bilirubin, Direct: 0.1 mg/dL (ref 0.0–0.3)
Total Bilirubin: 0.6 mg/dL (ref 0.3–1.2)

## 2013-01-01 LAB — BASIC METABOLIC PANEL
BUN: 26 mg/dL — ABNORMAL HIGH (ref 6–23)
CO2: 28 mEq/L (ref 19–32)
Calcium: 9.9 mg/dL (ref 8.4–10.5)
Creatinine, Ser: 1.4 mg/dL (ref 0.4–1.5)
GFR: 52.45 mL/min — ABNORMAL LOW (ref >60.00)
Glucose, Bld: 111 mg/dL — ABNORMAL HIGH (ref 70–99)

## 2013-01-01 LAB — CBC WITH DIFFERENTIAL/PLATELET
Basophils Absolute: 0 10*3/uL (ref 0.0–0.1)
Eosinophils Absolute: 0.2 10*3/uL (ref 0.0–0.7)
Lymphocytes Relative: 17.3 % (ref 12.0–46.0)
MCHC: 34 g/dL (ref 30.0–36.0)
MCV: 95.8 fl (ref 78.0–100.0)
Monocytes Absolute: 0.8 10*3/uL (ref 0.1–1.0)
Neutrophils Relative %: 72.7 % (ref 43.0–77.0)
RBC: 4.71 Mil/uL (ref 4.22–5.81)
RDW: 14.2 % (ref 11.5–14.6)

## 2013-01-01 LAB — LIPID PANEL
Cholesterol: 132 mg/dL (ref 0–200)
LDL Cholesterol: 64 mg/dL (ref 0–99)
Total CHOL/HDL Ratio: 4

## 2013-01-01 NOTE — Patient Instructions (Addendum)
Your physician recommends that you schedule a follow-up appointment in:  4 months. --May 04, 2013 at 8:00  Your physician recommends that you continue on your current medications as directed. Please refer to the Current Medication list given to you today.

## 2013-01-01 NOTE — Progress Notes (Signed)
[name] Date of Birth:  12-Apr-1925 [address] [place], [place]  [address] [phone]         Fax   [phone]  History of Present Illness: This pleasant 79 year old gentleman is seen for a four-month followup office visit. He has a history of established chronic atrial fibrillation. 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 He also has a history of chronic diastolic congestive heart failure. He has not had a cardiac catheterization. He does not have any history of ischemic heart disease. However an adenosine Cardiolite stress test in December 2009 showed evidence of an old scar of the septum and a larger apical scar but no reversible ischemia. The patient had an echocardiogram in November 2009 showing an ejection fraction 55-60% with mild aortic stenosis and mild pulmonary hypertension and mild mitral regurgitation. The patient is on long-term Coumadin for his chronic atrial fibrillation. He does not like to come frequently to get his INRs checked. Since last visit he's had no new cardiac symptoms.    Current Outpatient Prescriptions  Medication Sig Dispense Refill  . allopurinol (ZYLOPRIM) 100 MG tablet TAKE ONE TABLET BY MOUTH EVERY DAY  30 tablet  6  . ALOE VERA CONCENTRATE PO Take 600 mg by mouth daily.      Marland Kitchen aspirin 81 MG tablet Take 81 mg by mouth daily.        . cholecalciferol (VITAMIN D) 1000 UNITS tablet Take 1,000 Units by mouth daily. Taking 10,000 daily      . COD LIVER OIL PO Take by mouth.      . Coenzyme Q10 (CO Q 10 PO) Take by mouth.        . digoxin (LANOXIN) 0.125 MG tablet TAKE ONE TABLET BY MOUTH EVERY OTHER DAY  45 tablet  0  . finasteride (PROSCAR) 5 MG tablet Take 5 mg by mouth daily.        . furosemide (LASIX) 40 MG tablet 2 tablets in the morning and 1 in the evening  270 tablet  3  . GLUCOSAMINE-CHONDROITIN ER PO Take by mouth.      Marland Kitchen lisinopril (PRINIVIL,ZESTRIL) 20 MG tablet Take 1 tablet (20  mg total) by mouth daily. PM  90 tablet  30  . lisinopril-hydrochlorothiazide (PRINZIDE,ZESTORETIC) 20-12.5 MG per tablet Take 1 tablet by mouth daily. AM  30 tablet  6  . metoprolol succinate (TOPROL-XL) 25 MG 24 hr tablet Take 12.5 mg by mouth daily.      . Multiple Vitamin (MULTIVITAMIN PO) Take by mouth.        . Multiple Vitamins-Minerals (PRESERVISION AREDS 2) CAPS Take 1 capsule by mouth daily. Without beta carotene      . nitroGLYCERIN (NITROSTAT) 0.4 MG SL tablet Place 0.4 mg under the tongue every 5 (five) minutes as needed.        . Omega-3 Fatty Acids (FISH OIL) 1000 MG CAPS Take by mouth daily.      Marland Kitchen oxybutynin (DITROPAN-XL) 5 MG 24 hr tablet Take 5 mg by mouth daily.      . pravastatin (PRAVACHOL) 40 MG tablet TAKE ONE-HALF TABLET BY MOUTH EVERY DAY  45 tablet  2  . warfarin (COUMADIN) 5 MG tablet Take 1 tablet (5 mg total) by mouth as directed.  100 tablet  1   No current facility-administered medications for this visit.    Allergies  Allergen Reactions  . Benazepril Hcl     Lethargic, nervous, nauseated  Patient Active Problem List   Diagnosis Date Noted  . Benign hypertensive heart disease without heart failure 06/08/2010    Priority: High  . Atrial fibrillation 06/08/2010    Priority: Medium  . Chronic diastolic congestive heart failure 06/08/2010    Priority: Medium  . Gout 09/01/2012  . Skin lesion 06/23/2012  . Cognitive impairment   . Dizziness - light-headed 09/07/2010  . Encounter for long-term (current) use of anticoagulants 06/08/2010  . Right bundle branch block 06/08/2010  . Cerebrovascular accident, embolic 06/08/2010  . Aortic stenosis, mild 06/08/2010  . Hypercholesterolemia 06/08/2010  . BPH (benign prostatic hyperplasia) 06/08/2010    History  Smoking status  . Former Smoker  . Quit date: 02/08/1950  Smokeless tobacco  . Never Used    History  Alcohol Use No    Family History  Problem Relation Age of Onset  . Cancer Brother      brain  . Cancer Sister     colon  . CAD Sister   . Alcohol abuse Brother     Review of Systems: Constitutional: no fever chills diaphoresis or fatigue or change in weight.  Head and neck: no hearing loss, no epistaxis, no photophobia or visual disturbance. Respiratory: No cough, shortness of breath or wheezing. Cardiovascular: No chest pain peripheral edema, palpitations. Gastrointestinal: No abdominal distention, no abdominal pain, no change in bowel habits hematochezia or melena. Genitourinary: No dysuria, no frequency, no urgency, no nocturia. Musculoskeletal:No arthralgias, no back pain, no gait disturbance or myalgias. Neurological: No dizziness, no headaches, no numbness, no seizures, no syncope, no weakness, no tremors. Hematologic: No lymphadenopathy, no easy bruising. Psychiatric: No confusion, no hallucinations, no sleep disturbance.    Physical Exam: Filed Vitals:   01/01/13 0832  BP: 100/60  Pulse: 79   the general appearance reveals a large elderly gentleman in no distress.  There is mild expressive aphasia.The head and neck exam reveals pupils equal and reactive.  Extraocular movements are full.  There is no scleral icterus.  The mouth and pharynx are normal.  The neck is supple.  The carotids reveal no bruits.  The jugular venous pressure is normal.  The  thyroid is not enlarged.  There is no lymphadenopathy.  The chest is clear to percussion and auscultation.  There are no rales or rhonchi.  Expansion of the chest is symmetrical.  The precordium is quiet.  The first heart sound is normal.  The second heart sound is physiologically split.  There is a faint basilar systolic ejection murmur grade 1/6.  No diastolic murmur.  The pulse is irregularly irregular  There is no abnormal lift or heave.  The abdomen is soft and nontender.  The bowel sounds are normal.  The liver and spleen are not enlarged.  There are no abdominal masses.  There are no abdominal bruits.  Extremities  reveal good pedal pulses.  There is no phlebitis or edema.  There is no cyanosis or clubbing.  Strength is normal and symmetrical in all extremities.  There is no lateralizing weakness.  There are no sensory deficits.  The skin is warm and dry.  There is no rash.  EKG shows atrial fibrillation with bifascicular block.  No prior EKG to compare.   Assessment / Plan: Continue on same medication. Extensive blood work being drawn today.  Recheck in 4 months for office visit

## 2013-01-01 NOTE — Progress Notes (Signed)
Quick Note:  Please report to patient. The recent labs are stable. Continue same medication and careful diet. Uric acid still slightly high so he should stay on the allopurinol to prevent gout. ______

## 2013-01-01 NOTE — Assessment & Plan Note (Signed)
The patient is in permanent atrial fibrillation.  No new TIA symptoms.

## 2013-01-01 NOTE — Assessment & Plan Note (Signed)
The patient is not having any orthopnea or paroxysmal nocturnal dyspnea.  No significant peripheral edema today.  His weight is down 1 pound.

## 2013-01-01 NOTE — Assessment & Plan Note (Signed)
He has expressive aphasia as a result of his previous embolic CVA.  His expressive aphasia has improved over the years.

## 2013-02-22 ENCOUNTER — Other Ambulatory Visit: Payer: Self-pay | Admitting: Pharmacist

## 2013-02-22 MED ORDER — WARFARIN SODIUM 5 MG PO TABS: 5.0000 mg | ORAL_TABLET | ORAL | Status: DC

## 2013-02-23 ENCOUNTER — Ambulatory Visit (INDEPENDENT_AMBULATORY_CARE_PROVIDER_SITE_OTHER): Payer: Medicare Other | Admitting: *Deleted

## 2013-02-23 DIAGNOSIS — I4891 Unspecified atrial fibrillation: Secondary | ICD-10-CM

## 2013-02-23 DIAGNOSIS — Z7901 Long term (current) use of anticoagulants: Secondary | ICD-10-CM

## 2013-02-23 LAB — POCT INR: INR: 2.7

## 2013-03-08 ENCOUNTER — Other Ambulatory Visit: Payer: Self-pay

## 2013-03-08 MED ORDER — DIGOXIN 125 MCG PO TABS: ORAL_TABLET | ORAL | Status: DC

## 2013-04-04 ENCOUNTER — Encounter (INDEPENDENT_AMBULATORY_CARE_PROVIDER_SITE_OTHER): Payer: Medicare Other | Admitting: Ophthalmology

## 2013-04-04 ENCOUNTER — Ambulatory Visit (INDEPENDENT_AMBULATORY_CARE_PROVIDER_SITE_OTHER): Payer: Medicare Other | Admitting: *Deleted

## 2013-04-04 DIAGNOSIS — I10 Essential (primary) hypertension: Secondary | ICD-10-CM

## 2013-04-04 DIAGNOSIS — Z7901 Long term (current) use of anticoagulants: Secondary | ICD-10-CM

## 2013-04-04 DIAGNOSIS — H35039 Hypertensive retinopathy, unspecified eye: Secondary | ICD-10-CM

## 2013-04-04 DIAGNOSIS — H3530 Unspecified macular degeneration: Secondary | ICD-10-CM

## 2013-04-04 DIAGNOSIS — I4891 Unspecified atrial fibrillation: Secondary | ICD-10-CM

## 2013-04-04 DIAGNOSIS — H43819 Vitreous degeneration, unspecified eye: Secondary | ICD-10-CM

## 2013-04-04 LAB — POCT INR: INR: 2

## 2013-04-17 ENCOUNTER — Other Ambulatory Visit: Payer: Self-pay

## 2013-04-17 DIAGNOSIS — I119 Hypertensive heart disease without heart failure: Secondary | ICD-10-CM

## 2013-04-17 MED ORDER — LISINOPRIL 20 MG PO TABS: 20.0000 mg | ORAL_TABLET | Freq: Every day | ORAL | Status: DC

## 2013-05-04 ENCOUNTER — Ambulatory Visit (INDEPENDENT_AMBULATORY_CARE_PROVIDER_SITE_OTHER): Payer: Medicare Other | Admitting: Cardiology

## 2013-05-04 ENCOUNTER — Ambulatory Visit (INDEPENDENT_AMBULATORY_CARE_PROVIDER_SITE_OTHER): Payer: Medicare Other | Admitting: *Deleted

## 2013-05-04 ENCOUNTER — Encounter: Payer: Self-pay | Admitting: Cardiology

## 2013-05-04 VITALS — BP 119/54 | HR 52 | Ht 71.0 in | Wt 233.0 lb

## 2013-05-04 DIAGNOSIS — Z7901 Long term (current) use of anticoagulants: Secondary | ICD-10-CM

## 2013-05-04 DIAGNOSIS — I4891 Unspecified atrial fibrillation: Secondary | ICD-10-CM

## 2013-05-04 DIAGNOSIS — I119 Hypertensive heart disease without heart failure: Secondary | ICD-10-CM

## 2013-05-04 DIAGNOSIS — Z5181 Encounter for therapeutic drug level monitoring: Secondary | ICD-10-CM | POA: Insufficient documentation

## 2013-05-04 DIAGNOSIS — I509 Heart failure, unspecified: Secondary | ICD-10-CM

## 2013-05-04 DIAGNOSIS — I639 Cerebral infarction, unspecified: Secondary | ICD-10-CM

## 2013-05-04 DIAGNOSIS — M109 Gout, unspecified: Secondary | ICD-10-CM

## 2013-05-04 DIAGNOSIS — I6340 Cerebral infarction due to embolism of unspecified cerebral artery: Secondary | ICD-10-CM

## 2013-05-04 DIAGNOSIS — I5032 Chronic diastolic (congestive) heart failure: Secondary | ICD-10-CM

## 2013-05-04 LAB — POCT INR: INR: 2.5

## 2013-05-04 NOTE — Patient Instructions (Signed)
Your physician recommends that you continue on your current medications as directed. Please refer to the Current Medication list given to you today.  Your physician wants you to follow-up in: 4 months with fasting labs (lp/bmet/hfp/cbc)  You will receive a reminder letter in the mail two months in advance. If you don't receive a letter, please call our office to schedule the follow-up appointment.  

## 2013-05-04 NOTE — Assessment & Plan Note (Signed)
No TIAs or recurrent stroke symptoms.  Is not having any problems from the Coumadin in terms of GI bleeding etc.  Will plan to check a CBC at his next visit

## 2013-05-04 NOTE — Assessment & Plan Note (Signed)
Blood pressure is remaining stable on current therapy.  He is on Lasix as well as hydrochlorothiazide.  This combination appears to be doing well for him.  No dizziness or syncope.  No further TIA symptoms.

## 2013-05-04 NOTE — Assessment & Plan Note (Signed)
The patient is not having any symptoms of worsening heart failure.  His breathing is good.  He is not having any edema.  No orthopnea or paroxysmal nocturnal dyspnea.

## 2013-05-04 NOTE — Progress Notes (Signed)
[name] Date of Birth:  17-Oct-1925 [address] [place], [place]  [id number] [phone]         Fax   [phone]  History of Present Illness: This pleasant 80 year old gentleman is seen for a four-month followup office visit. He has a history of established chronic atrial fibrillation. 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 He also has a history of chronic diastolic congestive heart failure. He has not had a cardiac catheterization. He does not have any history of ischemic heart disease. However an adenosine Cardiolite stress test in December 2009 showed evidence of an old scar of the septum and a larger apical scar but no reversible ischemia. The patient had an echocardiogram in November 2009 showing an ejection fraction 55-60% with mild aortic stenosis and mild pulmonary hypertension and mild mitral regurgitation. The patient is on long-term Coumadin for his chronic atrial fibrillation. He does not like to come frequently to get his INRs checked. Since last visit he's had no new cardiac symptoms.  His appetite is good.  He has gained 6 pounds since last visit.   Current Outpatient Prescriptions  Medication Sig Dispense Refill  . allopurinol (ZYLOPRIM) 100 MG tablet TAKE ONE TABLET BY MOUTH EVERY DAY  30 tablet  6  . ALOE VERA CONCENTRATE PO Take 600 mg by mouth daily.      Marland Kitchen. aspirin 81 MG tablet Take 81 mg by mouth daily.        . cholecalciferol (VITAMIN D) 1000 UNITS tablet Take 1,000 Units by mouth daily. Taking 10,000 daily      . COD LIVER OIL PO Take by mouth.      . Coenzyme Q10 (CO Q 10 PO) Take by mouth.        . digoxin (LANOXIN) 0.125 MG tablet TAKE ONE TABLET BY MOUTH EVERY OTHER DAY  45 tablet  2  . finasteride (PROSCAR) 5 MG tablet Take 5 mg by mouth daily.        . furosemide (LASIX) 40 MG tablet 2 tablets in the morning and 1 in the evening  270 tablet  3  . GLUCOSAMINE-CHONDROITIN ER PO Take by mouth.       Marland Kitchen. lisinopril (PRINIVIL,ZESTRIL) 20 MG tablet Take 1 tablet (20 mg total) by mouth daily. PM  90 tablet  1  . lisinopril-hydrochlorothiazide (PRINZIDE,ZESTORETIC) 20-12.5 MG per tablet Take 1 tablet by mouth daily. AM  30 tablet  6  . metoprolol succinate (TOPROL-XL) 25 MG 24 hr tablet Take 12.5 mg by mouth daily.      . Multiple Vitamin (MULTIVITAMIN PO) Take by mouth.        . Multiple Vitamins-Minerals (PRESERVISION AREDS 2) CAPS Take 1 capsule by mouth daily. Without beta carotene      . nitroGLYCERIN (NITROSTAT) 0.4 MG SL tablet Place 0.4 mg under the tongue every 5 (five) minutes as needed.        . Omega-3 Fatty Acids (FISH OIL) 1000 MG CAPS Take by mouth daily.      Marland Kitchen. oxybutynin (DITROPAN-XL) 5 MG 24 hr tablet Take 5 mg by mouth daily.      . pravastatin (PRAVACHOL) 40 MG tablet TAKE ONE-HALF TABLET BY MOUTH EVERY DAY  45 tablet  2  . warfarin (COUMADIN) 5 MG tablet Take 1 tablet (5 mg total) by mouth as directed.  100 tablet  1   No current facility-administered medications for this visit.    Allergies  Allergen Reactions  . Benazepril Hcl     Lethargic, nervous, nauseated    Patient Active Problem List   Diagnosis Date Noted  . Benign hypertensive heart disease without heart failure 06/08/2010    Priority: High  . Atrial fibrillation 06/08/2010    Priority: Medium  . Chronic diastolic congestive heart failure 06/08/2010    Priority: Medium  . Encounter for therapeutic drug monitoring 05/04/2013  . Gout 09/01/2012  . Skin lesion 06/23/2012  . Cognitive impairment   . Dizziness - light-headed 09/07/2010  . Encounter for long-term (current) use of anticoagulants 06/08/2010  . Right bundle branch block 06/08/2010  . Cerebrovascular accident, embolic 06/08/2010  . Aortic stenosis, mild 06/08/2010  . Hypercholesterolemia 06/08/2010  . BPH (benign prostatic hyperplasia) 06/08/2010    History  Smoking status  . Former Smoker  . Quit date: 02/08/1950  Smokeless  tobacco  . Never Used    History  Alcohol Use No    Family History  Problem Relation Age of Onset  . Cancer Brother     brain  . Cancer Sister     colon  . CAD Sister   . Alcohol abuse Brother     Review of Systems: Constitutional: no fever chills diaphoresis or fatigue or change in weight.  Head and neck: no hearing loss, no epistaxis, no photophobia or visual disturbance. Respiratory: No cough, shortness of breath or wheezing. Cardiovascular: No chest pain peripheral edema, palpitations. Gastrointestinal: No abdominal distention, no abdominal pain, no change in bowel habits hematochezia or melena. Genitourinary: No dysuria, no frequency, no urgency, no nocturia. Musculoskeletal:No arthralgias, no back pain, no gait disturbance or myalgias. Neurological: No dizziness, no headaches, no numbness, no seizures, no syncope, no weakness, no tremors. Hematologic: No lymphadenopathy, no easy bruising. Psychiatric: No confusion, no hallucinations, no sleep disturbance.    Physical Exam: Filed Vitals:   05/04/13 0826  BP: 119/54  Pulse: 52   the general appearance reveals a large elderly gentleman in no distress.  There is mild expressive aphasia.The head and neck exam reveals pupils equal and reactive.  Extraocular movements are full.  There is no scleral icterus.  The mouth and pharynx are normal.  The neck is supple.  The carotids reveal no bruits.  The jugular venous pressure is normal.  The  thyroid is not enlarged.  There is no lymphadenopathy.  The chest is clear to percussion and auscultation.  There are no rales or rhonchi.  Expansion of the chest is symmetrical.  The precordium is quiet.  The first heart sound is normal.  The second heart sound is physiologically split.  There is a faint basilar systolic ejection murmur grade 1/6.  No diastolic murmur.  The pulse is irregularly irregular  There is no abnormal lift or heave.  The abdomen is soft and nontender.  The bowel sounds  are normal.  The liver and spleen are not enlarged.  There are no abdominal masses.  There are no abdominal bruits.  Extremities reveal good pedal pulses.  There is no phlebitis or edema.  There is no cyanosis or clubbing.  Strength is normal and symmetrical in all extremities.  There is no lateralizing weakness.  There are no sensory deficits.  The skin is warm and dry.  There is no rash.      Assessment / Plan: Continue on same medication. .  Recheck in 4 months for office visit CBC uric acid and liver panel hepatic function panel and basal metabolic panel.

## 2013-05-04 NOTE — Assessment & Plan Note (Signed)
The patient has not had any recurrent gout symptoms

## 2013-05-14 ENCOUNTER — Other Ambulatory Visit: Payer: Self-pay | Admitting: *Deleted

## 2013-05-14 MED ORDER — PRAVASTATIN SODIUM 40 MG PO TABS: ORAL_TABLET | ORAL | Status: DC

## 2013-05-30 ENCOUNTER — Other Ambulatory Visit: Payer: Self-pay

## 2013-05-30 MED ORDER — METOPROLOL SUCCINATE ER 25 MG PO TB24: 12.5000 mg | ORAL_TABLET | Freq: Every day | ORAL | Status: DC

## 2013-06-15 ENCOUNTER — Ambulatory Visit (INDEPENDENT_AMBULATORY_CARE_PROVIDER_SITE_OTHER): Payer: Medicare Other

## 2013-06-15 DIAGNOSIS — I4891 Unspecified atrial fibrillation: Secondary | ICD-10-CM

## 2013-06-15 DIAGNOSIS — Z7901 Long term (current) use of anticoagulants: Secondary | ICD-10-CM

## 2013-06-15 DIAGNOSIS — Z5181 Encounter for therapeutic drug level monitoring: Secondary | ICD-10-CM

## 2013-06-15 LAB — POCT INR: INR: 2.4

## 2013-06-25 ENCOUNTER — Other Ambulatory Visit: Payer: Self-pay | Admitting: *Deleted

## 2013-06-25 MED ORDER — WARFARIN SODIUM 5 MG PO TABS: ORAL_TABLET | ORAL | Status: DC

## 2013-06-27 ENCOUNTER — Other Ambulatory Visit: Payer: Self-pay | Admitting: *Deleted

## 2013-06-27 MED ORDER — LISINOPRIL-HYDROCHLOROTHIAZIDE 20-12.5 MG PO TABS: 1.0000 | ORAL_TABLET | Freq: Every day | ORAL | Status: DC

## 2013-07-09 ENCOUNTER — Other Ambulatory Visit: Payer: Self-pay | Admitting: *Deleted

## 2013-07-09 MED ORDER — FUROSEMIDE 40 MG PO TABS: ORAL_TABLET | ORAL | Status: DC

## 2013-07-09 MED ORDER — ALLOPURINOL 100 MG PO TABS: ORAL_TABLET | ORAL | Status: DC

## 2013-07-27 ENCOUNTER — Ambulatory Visit (INDEPENDENT_AMBULATORY_CARE_PROVIDER_SITE_OTHER): Payer: Medicare Other | Admitting: Pharmacist

## 2013-07-27 DIAGNOSIS — Z7901 Long term (current) use of anticoagulants: Secondary | ICD-10-CM

## 2013-07-27 DIAGNOSIS — Z5181 Encounter for therapeutic drug level monitoring: Secondary | ICD-10-CM

## 2013-07-27 DIAGNOSIS — I4891 Unspecified atrial fibrillation: Secondary | ICD-10-CM

## 2013-07-27 LAB — POCT INR: INR: 1.9

## 2013-08-28 ENCOUNTER — Other Ambulatory Visit: Payer: Self-pay | Admitting: *Deleted

## 2013-08-28 MED ORDER — LISINOPRIL-HYDROCHLOROTHIAZIDE 20-12.5 MG PO TABS: 1.0000 | ORAL_TABLET | Freq: Every day | ORAL | Status: DC

## 2013-09-03 ENCOUNTER — Encounter: Payer: Self-pay | Admitting: Cardiology

## 2013-09-03 ENCOUNTER — Other Ambulatory Visit (INDEPENDENT_AMBULATORY_CARE_PROVIDER_SITE_OTHER): Payer: Medicare Other

## 2013-09-03 ENCOUNTER — Ambulatory Visit (INDEPENDENT_AMBULATORY_CARE_PROVIDER_SITE_OTHER): Payer: Medicare Other | Admitting: Pharmacist

## 2013-09-03 ENCOUNTER — Ambulatory Visit (INDEPENDENT_AMBULATORY_CARE_PROVIDER_SITE_OTHER): Payer: Medicare Other | Admitting: Cardiology

## 2013-09-03 VITALS — BP 112/45 | HR 72 | Ht 72.0 in | Wt 226.8 lb

## 2013-09-03 DIAGNOSIS — R0600 Dyspnea, unspecified: Secondary | ICD-10-CM

## 2013-09-03 DIAGNOSIS — I4891 Unspecified atrial fibrillation: Secondary | ICD-10-CM

## 2013-09-03 DIAGNOSIS — I482 Chronic atrial fibrillation, unspecified: Secondary | ICD-10-CM

## 2013-09-03 DIAGNOSIS — I350 Nonrheumatic aortic (valve) stenosis: Secondary | ICD-10-CM

## 2013-09-03 DIAGNOSIS — R0989 Other specified symptoms and signs involving the circulatory and respiratory systems: Secondary | ICD-10-CM

## 2013-09-03 DIAGNOSIS — I119 Hypertensive heart disease without heart failure: Secondary | ICD-10-CM

## 2013-09-03 DIAGNOSIS — E7800 Pure hypercholesterolemia, unspecified: Secondary | ICD-10-CM

## 2013-09-03 DIAGNOSIS — Z7901 Long term (current) use of anticoagulants: Secondary | ICD-10-CM

## 2013-09-03 DIAGNOSIS — I359 Nonrheumatic aortic valve disorder, unspecified: Secondary | ICD-10-CM

## 2013-09-03 DIAGNOSIS — I5032 Chronic diastolic (congestive) heart failure: Secondary | ICD-10-CM

## 2013-09-03 DIAGNOSIS — Z5181 Encounter for therapeutic drug level monitoring: Secondary | ICD-10-CM

## 2013-09-03 DIAGNOSIS — M109 Gout, unspecified: Secondary | ICD-10-CM

## 2013-09-03 DIAGNOSIS — R0609 Other forms of dyspnea: Secondary | ICD-10-CM | POA: Insufficient documentation

## 2013-09-03 LAB — CBC WITH DIFFERENTIAL/PLATELET
Basophils Absolute: 0 10*3/uL (ref 0.0–0.1)
Basophils Relative: 0.4 % (ref 0.0–3.0)
Eosinophils Absolute: 0.1 10*3/uL (ref 0.0–0.7)
Eosinophils Relative: 1.1 % (ref 0.0–5.0)
HCT: 44 % (ref 39.0–52.0)
HEMOGLOBIN: 15.1 g/dL (ref 13.0–17.0)
LYMPHS PCT: 19.4 % (ref 12.0–46.0)
Lymphs Abs: 1.6 10*3/uL (ref 0.7–4.0)
MCHC: 34.4 g/dL (ref 30.0–36.0)
MCV: 95.6 fl (ref 78.0–100.0)
MONOS PCT: 8.7 % (ref 3.0–12.0)
Monocytes Absolute: 0.7 10*3/uL (ref 0.1–1.0)
NEUTROS PCT: 70.4 % (ref 43.0–77.0)
Neutro Abs: 5.7 10*3/uL (ref 1.4–7.7)
Platelets: 168 10*3/uL (ref 150.0–400.0)
RBC: 4.6 Mil/uL (ref 4.22–5.81)
RDW: 14.1 % (ref 11.5–15.5)
WBC: 8.1 10*3/uL (ref 4.0–10.5)

## 2013-09-03 LAB — URIC ACID: Uric Acid, Serum: 7.8 mg/dL (ref 4.0–7.8)

## 2013-09-03 LAB — BASIC METABOLIC PANEL
BUN: 25 mg/dL — ABNORMAL HIGH (ref 6–23)
CO2: 25 meq/L (ref 19–32)
CREATININE: 1.4 mg/dL (ref 0.4–1.5)
Calcium: 10.2 mg/dL (ref 8.4–10.5)
Chloride: 100 mEq/L (ref 96–112)
GFR: 53.27 mL/min — AB (ref >60.00)
GLUCOSE: 104 mg/dL — AB (ref 70–99)
Potassium: 4.5 mEq/L (ref 3.5–5.1)
Sodium: 135 mEq/L (ref 135–145)

## 2013-09-03 LAB — POCT INR: INR: 2.2

## 2013-09-03 LAB — HEPATIC FUNCTION PANEL
ALBUMIN: 4 g/dL (ref 3.5–5.2)
ALK PHOS: 36 U/L — AB (ref 39–117)
ALT: 20 U/L (ref 0–53)
AST: 22 U/L (ref 0–37)
Bilirubin, Direct: 0.1 mg/dL (ref 0.0–0.3)
Total Bilirubin: 0.8 mg/dL (ref 0.2–1.2)
Total Protein: 6.9 g/dL (ref 6.0–8.3)

## 2013-09-03 MED ORDER — DIGOXIN 125 MCG PO TABS: ORAL_TABLET | ORAL | Status: DC

## 2013-09-03 MED ORDER — PRAVASTATIN SODIUM 40 MG PO TABS: ORAL_TABLET | ORAL | Status: DC

## 2013-09-03 MED ORDER — LISINOPRIL-HYDROCHLOROTHIAZIDE 20-12.5 MG PO TABS: 1.0000 | ORAL_TABLET | Freq: Every day | ORAL | Status: DC

## 2013-09-03 MED ORDER — FUROSEMIDE 40 MG PO TABS: ORAL_TABLET | ORAL | Status: DC

## 2013-09-03 MED ORDER — METOPROLOL SUCCINATE ER 25 MG PO TB24: 12.5000 mg | ORAL_TABLET | Freq: Every day | ORAL | Status: DC

## 2013-09-03 MED ORDER — ALLOPURINOL 100 MG PO TABS: ORAL_TABLET | ORAL | Status: DC

## 2013-09-03 MED ORDER — LISINOPRIL 20 MG PO TABS: 20.0000 mg | ORAL_TABLET | Freq: Every day | ORAL | Status: DC

## 2013-09-03 MED ORDER — WARFARIN SODIUM 5 MG PO TABS: ORAL_TABLET | ORAL | Status: DC

## 2013-09-03 NOTE — Progress Notes (Signed)
[name] Date of Birth:  [birth year] [facility] [address] [place], [place]  [address] [phone]        Fax   [phone]   History of Present Illness: This pleasant 80 year old gentleman is seen for a four-month followup office visit. He has a history of established chronic atrial fibrillation. 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 He also has a history of chronic diastolic congestive heart failure. He has not had a cardiac catheterization. He does not have any history of ischemic heart disease. However an adenosine Cardiolite stress test in December 2009 showed evidence of an old scar of the septum and a larger apical scar but no reversible ischemia. The patient had an echocardiogram in November 2009 showing an ejection fraction 55-60% with mild aortic stenosis and mild pulmonary hypertension and mild mitral regurgitation. The patient is on long-term Coumadin for his chronic atrial fibrillation.  Since last visit the patient has had more problems with dyspnea even though his weight is down 7 pounds.  Current Outpatient Prescriptions  Medication Sig Dispense Refill  . allopurinol (ZYLOPRIM) 100 MG tablet TAKE ONE TABLET BY MOUTH EVERY DAY  90 tablet  3  . aspirin 81 MG tablet Take 81 mg by mouth daily.        . cholecalciferol (VITAMIN D) 1000 UNITS tablet Take 1,000 Units by mouth daily. Taking 10,000 daily      . COD LIVER OIL PO Take by mouth.      . Coenzyme Q10 (CO Q 10 PO) Take by mouth.        . digoxin (LANOXIN) 0.125 MG tablet TAKE ONE TABLET BY MOUTH EVERY OTHER DAY  45 tablet  3  . finasteride (PROSCAR) 5 MG tablet Take 5 mg by mouth daily.        . furosemide (LASIX) 40 MG tablet 2 tablets in the morning  180 tablet  3  . GLUCOSAMINE-CHONDROITIN ER PO Take by mouth.      Marland Kitchen. lisinopril (PRINIVIL,ZESTRIL) 20 MG tablet Take 1 tablet (20 mg total) by mouth daily. PM  90 tablet  1  .  lisinopril-hydrochlorothiazide (PRINZIDE,ZESTORETIC) 20-12.5 MG per tablet Take 1 tablet by mouth daily. AM  90 tablet  3  . metoprolol succinate (TOPROL-XL) 25 MG 24 hr tablet Take 0.5 tablets (12.5 mg total) by mouth daily.  45 tablet  3  . Multiple Vitamin (MULTIVITAMIN PO) Take by mouth.        . Multiple Vitamins-Minerals (PRESERVISION AREDS 2) CAPS Take 1 capsule by mouth daily. Without beta carotene      . nitroGLYCERIN (NITROSTAT) 0.4 MG SL tablet Place 0.4 mg under the tongue every 5 (five) minutes as needed.        . Omega-3 Fatty Acids (FISH OIL) 1000 MG CAPS Take by mouth daily.      Marland Kitchen. oxybutynin (DITROPAN-XL) 5 MG 24 hr tablet Take 5 mg by mouth daily.      . pravastatin (PRAVACHOL) 40 MG tablet TAKE ONE-HALF TABLET BY MOUTH EVERY DAY  45 tablet  3  . warfarin (COUMADIN) 5 MG tablet 1.5 tablets everyday except 2 tablets only on Wednesdays and Fridays or as directed by coumadin clinic  180 tablet  0  . ALOE VERA CONCENTRATE PO Take 600 mg by mouth daily.       No current facility-administered medications for this visit.    Allergies  Allergen Reactions  . Benazepril  Hcl     Lethargic, nervous, nauseated    Patient Active Problem List   Diagnosis Date Noted  . Benign hypertensive heart disease without heart failure 06/08/2010    Priority: High  . Atrial fibrillation 06/08/2010    Priority: Medium  . Chronic diastolic congestive heart failure 06/08/2010    Priority: Medium  . Dyspnea on exertion 09/03/2013  . Encounter for therapeutic drug monitoring 05/04/2013  . Gout 09/01/2012  . Skin lesion 06/23/2012  . Cognitive impairment   . Dizziness - light-headed 09/07/2010  . Encounter for long-term (current) use of anticoagulants 06/08/2010  . Right bundle branch block 06/08/2010  . Cerebrovascular accident, embolic 06/08/2010  . Aortic stenosis, mild 06/08/2010  . Hypercholesterolemia 06/08/2010  . BPH (benign prostatic hyperplasia) 06/08/2010    History  Smoking  status  . Former Smoker  . Quit date: 02/08/1950  Smokeless tobacco  . Never Used    History  Alcohol Use No    Family History  Problem Relation Age of Onset  . Cancer Brother     brain  . Cancer Sister     colon  . CAD Sister   . Alcohol abuse Brother     Review of Systems: Constitutional: no fever chills diaphoresis or fatigue or change in weight.  Head and neck: no hearing loss, no epistaxis, no photophobia or visual disturbance. Respiratory: No cough, shortness of breath or wheezing. Cardiovascular: No chest pain peripheral edema, palpitations. Gastrointestinal: No abdominal distention, no abdominal pain, no change in bowel habits hematochezia or melena. Genitourinary: No dysuria, no frequency, no urgency, no nocturia. Musculoskeletal:No arthralgias, no back pain, no gait disturbance or myalgias. Neurological: No dizziness, no headaches, no numbness, no seizures, no syncope, no weakness, no tremors. Hematologic: No lymphadenopathy, no easy bruising. Psychiatric: No confusion, no hallucinations, no sleep disturbance.    Physical Exam: Filed Vitals:   09/03/13 0933  BP: 112/45  Pulse: 72   general appearance reveals an elderly gentleman in no acute distress.  He was very dyspneic after walking to the room.The head and neck exam reveals pupils equal and reactive.  Extraocular movements are full.  There is no scleral icterus.  The mouth and pharynx are normal.  The neck is supple.  The carotids reveal no bruits.  The jugular venous pressure is normal.  The  thyroid is not enlarged.  There is no lymphadenopathy.  The chest is clear to percussion and auscultation.  There are no rales or rhonchi.  Expansion of the chest is symmetrical.  The precordium is quiet.  The pulse is irregularly irregular  The first heart sound is normal.  The second heart sound is physiologically split.  There is soft basilar systolic murmur.  There is no abnormal lift or heave.  The abdomen is soft and  nontender.  The bowel sounds are normal.  The liver and spleen are not enlarged.  There are no abdominal masses.  There are no abdominal bruits.  Extremities reveal good pedal pulses.  There is no phlebitis or edema.  There is no cyanosis or clubbing.  Strength is normal and symmetrical in all extremities.  There is no lateralizing weakness.  There are no sensory deficits.  The skin is warm and dry.  There is no rash.     Assessment / Plan: 1. permanent atrial fibrillation. 2. remote embolic stroke with expressive aphasia 3. history of aortic stenosis 4. hypertensive heart disease without heart failure 5.  Hypercholesterolemia on pravastatin 6. chronic diastolic heart failure  Plan: Continue current medication.  He has been taking 2  40 mg Lasix tablets a day.  Return for echocardiogram.  Lab work today pending. Recheck in 4 months for office visit and basal metabolic panel

## 2013-09-03 NOTE — Progress Notes (Signed)
Quick Note:  Please report to patient. The recent labs are stable. Continue same medication and careful diet. ______ 

## 2013-09-03 NOTE — Patient Instructions (Addendum)
Will obtain labs today and call you with the results (lp/bmet/hfp/cbc)  Your physician has requested that you have an echocardiogram. Echocardiography is a painless test that uses sound waves to create images of your heart. It provides your doctor with information about the size and shape of your heart and how well your heart's chambers and valves are working. This procedure takes approximately one hour. There are no restrictions for this procedure.  Your physician recommends that you schedule a follow-up appointment in: 4 month ov/bmet

## 2013-09-03 NOTE — Assessment & Plan Note (Signed)
Patient denies any chest pain.  No dizziness or syncope.  He has easy fatigue and exertional dyspnea

## 2013-09-03 NOTE — Assessment & Plan Note (Signed)
The patient is in permanent atrial fibrillation.  He has had prior embolic stroke.  He has had no new TIA symptoms.  He remains on warfarin.

## 2013-09-03 NOTE — Assessment & Plan Note (Signed)
The patient is having more symptoms of exertional dyspnea.  We will update his echocardiogram.

## 2013-09-04 ENCOUNTER — Telehealth: Payer: Self-pay | Admitting: *Deleted

## 2013-09-04 ENCOUNTER — Other Ambulatory Visit (INDEPENDENT_AMBULATORY_CARE_PROVIDER_SITE_OTHER): Payer: Medicare Other

## 2013-09-04 DIAGNOSIS — I119 Hypertensive heart disease without heart failure: Secondary | ICD-10-CM

## 2013-09-04 LAB — LIPID PANEL
Cholesterol: 122 mg/dL (ref 0–200)
HDL: 33.1 mg/dL — AB (ref >39.00)
LDL Cholesterol: 60 mg/dL (ref 0–99)
NonHDL: 88.9
TRIGLYCERIDES: 143 mg/dL (ref 0.0–149.0)
Total CHOL/HDL Ratio: 4
VLDL: 28.6 mg/dL (ref 0.0–40.0)

## 2013-09-04 NOTE — Telephone Encounter (Signed)
Advised patient of lab results. Did send request for added lipid since not done at office visit as ordered.

## 2013-09-04 NOTE — Progress Notes (Signed)
Quick Note:  Please report to patient. The recent labs are stable. Continue same medication and careful diet. ______ 

## 2013-09-04 NOTE — Telephone Encounter (Signed)
Message copied by Burnell BlanksPRATT, Gladyce Mcray B on Tue Sep 04, 2013 11:56 AM ------      Message from: Cassell ClementBRACKBILL, THOMAS      Created: Mon Sep 03, 2013  8:24 PM       Please report to patient.  The recent labs are stable. Continue same medication and careful diet. ------

## 2013-10-26 ENCOUNTER — Encounter: Payer: Self-pay | Admitting: Cardiology

## 2013-10-29 ENCOUNTER — Ambulatory Visit (INDEPENDENT_AMBULATORY_CARE_PROVIDER_SITE_OTHER): Payer: Medicare Other | Admitting: Pharmacist

## 2013-10-29 DIAGNOSIS — Z7901 Long term (current) use of anticoagulants: Secondary | ICD-10-CM

## 2013-10-29 DIAGNOSIS — I4891 Unspecified atrial fibrillation: Secondary | ICD-10-CM

## 2013-10-29 DIAGNOSIS — Z5181 Encounter for therapeutic drug level monitoring: Secondary | ICD-10-CM

## 2013-10-29 LAB — POCT INR: INR: 2.4

## 2013-12-24 ENCOUNTER — Ambulatory Visit (INDEPENDENT_AMBULATORY_CARE_PROVIDER_SITE_OTHER): Payer: Medicare Other | Admitting: *Deleted

## 2013-12-24 DIAGNOSIS — I4891 Unspecified atrial fibrillation: Secondary | ICD-10-CM

## 2013-12-24 DIAGNOSIS — Z5181 Encounter for therapeutic drug level monitoring: Secondary | ICD-10-CM

## 2013-12-24 DIAGNOSIS — Z7901 Long term (current) use of anticoagulants: Secondary | ICD-10-CM

## 2013-12-24 LAB — POCT INR: INR: 2.3

## 2014-02-18 ENCOUNTER — Ambulatory Visit (INDEPENDENT_AMBULATORY_CARE_PROVIDER_SITE_OTHER): Payer: Medicare Other | Admitting: Surgery

## 2014-02-18 DIAGNOSIS — Z5181 Encounter for therapeutic drug level monitoring: Secondary | ICD-10-CM

## 2014-02-18 DIAGNOSIS — I4891 Unspecified atrial fibrillation: Secondary | ICD-10-CM

## 2014-02-18 DIAGNOSIS — Z7901 Long term (current) use of anticoagulants: Secondary | ICD-10-CM

## 2014-02-18 LAB — POCT INR: INR: 2.7

## 2014-03-12 ENCOUNTER — Other Ambulatory Visit: Payer: Self-pay | Admitting: *Deleted

## 2014-03-12 MED ORDER — WARFARIN SODIUM 5 MG PO TABS: ORAL_TABLET | ORAL | Status: DC

## 2014-04-04 ENCOUNTER — Ambulatory Visit (INDEPENDENT_AMBULATORY_CARE_PROVIDER_SITE_OTHER): Payer: Medicare Other | Admitting: Ophthalmology

## 2014-04-15 ENCOUNTER — Ambulatory Visit (INDEPENDENT_AMBULATORY_CARE_PROVIDER_SITE_OTHER): Payer: Medicare Other

## 2014-04-15 DIAGNOSIS — Z7901 Long term (current) use of anticoagulants: Secondary | ICD-10-CM

## 2014-04-15 DIAGNOSIS — Z5181 Encounter for therapeutic drug level monitoring: Secondary | ICD-10-CM

## 2014-04-15 DIAGNOSIS — I4891 Unspecified atrial fibrillation: Secondary | ICD-10-CM

## 2014-04-15 LAB — POCT INR: INR: 2.6

## 2014-04-16 ENCOUNTER — Encounter: Payer: Self-pay | Admitting: Cardiology

## 2014-06-03 ENCOUNTER — Other Ambulatory Visit: Payer: Self-pay | Admitting: *Deleted

## 2014-06-03 MED ORDER — WARFARIN SODIUM 5 MG PO TABS: ORAL_TABLET | ORAL | Status: DC

## 2014-06-11 ENCOUNTER — Ambulatory Visit (INDEPENDENT_AMBULATORY_CARE_PROVIDER_SITE_OTHER): Payer: Medicare Other | Admitting: *Deleted

## 2014-06-11 ENCOUNTER — Ambulatory Visit (INDEPENDENT_AMBULATORY_CARE_PROVIDER_SITE_OTHER): Payer: Medicare Other | Admitting: Cardiology

## 2014-06-11 ENCOUNTER — Encounter: Payer: Self-pay | Admitting: Cardiology

## 2014-06-11 VITALS — BP 116/70 | HR 62 | Ht 72.0 in

## 2014-06-11 DIAGNOSIS — I4891 Unspecified atrial fibrillation: Secondary | ICD-10-CM

## 2014-06-11 DIAGNOSIS — I119 Hypertensive heart disease without heart failure: Secondary | ICD-10-CM | POA: Diagnosis not present

## 2014-06-11 DIAGNOSIS — Z5181 Encounter for therapeutic drug level monitoring: Secondary | ICD-10-CM

## 2014-06-11 DIAGNOSIS — Z7901 Long term (current) use of anticoagulants: Secondary | ICD-10-CM | POA: Diagnosis not present

## 2014-06-11 DIAGNOSIS — Z79899 Other long term (current) drug therapy: Secondary | ICD-10-CM | POA: Diagnosis not present

## 2014-06-11 LAB — BASIC METABOLIC PANEL
BUN: 17 mg/dL (ref 6–23)
CALCIUM: 10.3 mg/dL (ref 8.4–10.5)
CO2: 29 mEq/L (ref 19–32)
Chloride: 102 mEq/L (ref 96–112)
Creatinine, Ser: 1.27 mg/dL (ref 0.40–1.50)
GFR: 57.05 mL/min — ABNORMAL LOW (ref >60.00)
Glucose, Bld: 101 mg/dL — ABNORMAL HIGH (ref 70–99)
POTASSIUM: 4.1 meq/L (ref 3.5–5.1)
SODIUM: 137 meq/L (ref 135–145)

## 2014-06-11 LAB — POCT INR: INR: 2.3

## 2014-06-11 NOTE — Patient Instructions (Signed)
Medication Instructions:  Your physician recommends that you continue on your current medications as directed. Please refer to the Current Medication list given to you today.  Labwork: BMET Testing/Procedures: NONE  Follow-Up: Your physician wants you to follow-up in: 6 months with fasting labs (lp/bmet/hfp)  You will receive a reminder letter in the mail two months in advance. If you don't receive a letter, please call our office to schedule the follow-up appointment.

## 2014-06-11 NOTE — Progress Notes (Signed)
Cardiology Office Note   Date:  06/11/2014   ID:  [name], DOB 1925/08/09, MRN [id number]  PCP:  Eustaquio Boyden, MD  Cardiologist: Cassell Clement MD  No chief complaint on file.     History of Present Illness: [name] is a 81 y.o. male who presents for a scheduled follow-up visit This pleasant 81 year old gentleman is seen for a four-month followup office visit. He has a history of established chronic atrial fibrillation. 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 He also has a history of chronic diastolic congestive heart failure. He has not had a cardiac catheterization. He does not have any history of ischemic heart disease. However an adenosine Cardiolite stress test in December 2009 showed evidence of an old scar of the septum and a larger apical scar but no reversible ischemia. The patient had an echocardiogram in November 2009 showing an ejection fraction 55-60% with mild aortic stenosis and mild pulmonary hypertension and mild mitral regurgitation. The patient is on long-term Coumadin for his chronic atrial fibrillation.  The patient continues to have exertional dyspnea which is stable.  He has not been having any peripheral edema.  He is having more of a problem with bladder urgency.  He is no longer able to attend church.  Past Medical History  Diagnosis Date  . CHF (congestive heart failure)   . Atrial fibrillation     chronic  . CAD (coronary artery disease)     s/p MI, unsure when  . CVA (cerebrovascular accident) x4 (latest 2007)    embolic, residual cognitive impairment  . DOE (dyspnea on exertion)   . Hyperlipidemia   . HTN (hypertension)   . Cognitive impairment     from stroke    Past Surgical History  Procedure Laterality Date  . Appendectomy  1994  . Hemorrhoid surgery    . Cardiovascular stress test  01/11/2008    evidence of an old scar of the septum and a larger apical scar but no  reversible ischemia, EF 48%  . US echocardiography  12/19/2007    EF 55-60%, mild aortic stenosis and mild pulmonary hypertension and mild mitral regurgitation  . Tonsillectomy  1964  . Excision morton's neuroma  1995    x 2  . Cataract extraction Left 2008    [facility]     Current Outpatient Prescriptions  Medication Sig Dispense Refill  . allopurinol (ZYLOPRIM) 100 MG tablet TAKE ONE TABLET BY MOUTH EVERY DAY 90 tablet 3  . ALOE VERA CONCENTRATE PO Take 600 mg by mouth daily.    Marland Kitchen aspirin 81 MG tablet Take 81 mg by mouth daily.      . cholecalciferol (VITAMIN D) 1000 UNITS tablet Take 1,000 Units by mouth daily. Taking 10,000 daily    . COD LIVER OIL PO Take by mouth.    . Coenzyme Q10 (CO Q 10 PO) Take by mouth.      . digoxin (LANOXIN) 0.125 MG tablet TAKE ONE TABLET BY MOUTH EVERY OTHER DAY 45 tablet 3  . finasteride (PROSCAR) 5 MG tablet Take 5 mg by mouth daily.      . furosemide (LASIX) 40 MG tablet Take 80 mg by mouth daily.    Marland Kitchen GLUCOSAMINE-CHONDROITIN ER PO Take by mouth.    Marland Kitchen lisinopril (PRINIVIL,ZESTRIL) 20 MG tablet Take 1 tablet (20 mg total) by mouth daily. PM 90 tablet 1  . lisinopril-hydrochlorothiazide (PRINZIDE,ZESTORETIC) 20-12.5 MG per tablet Take 1 tablet by  mouth daily. AM 90 tablet 3  . metoprolol succinate (TOPROL-XL) 25 MG 24 hr tablet Take 0.5 tablets (12.5 mg total) by mouth daily. 45 tablet 3  . Multiple Vitamin (MULTIVITAMIN PO) Take 1 tablet by mouth daily.     . Multiple Vitamins-Minerals (PRESERVISION AREDS 2) CAPS Take 1 capsule by mouth daily. Without beta carotene    . nitroGLYCERIN (NITROSTAT) 0.4 MG SL tablet Place 0.4 mg under the tongue every 5 (five) minutes as needed (for chest pain).     . Omega-3 Fatty Acids (FISH OIL) 1000 MG CAPS Take 1,000 mg by mouth daily.     Marland Kitchen. oxybutynin (DITROPAN-XL) 5 MG 24 hr tablet Take 5 mg by mouth daily.    . pravastatin (PRAVACHOL) 40 MG tablet TAKE ONE-HALF TABLET BY MOUTH EVERY DAY 45 tablet 3  . warfarin  (COUMADIN) 5 MG tablet 1.5 tablets everyday except 2 tablets only on Wednesdays and Fridays or as directed by coumadin clinic 180 tablet 0   No current facility-administered medications for this visit.    Allergies:   Benazepril hcl   Social History:  The patient  reports that he quit smoking about 64 years ago. He has never used smokeless tobacco. He reports that he does not drink alcohol or use illicit drugs.   Family History:  The patient's family history includes Alcohol abuse in his brother; CAD in his sister; Cancer in his brother and sister.    ROS:  Please see the history of present illness.   Otherwise, review of systems are positive for none.   All other systems are reviewed and negative.    PHYSICAL EXAM: VS:  BP 116/70 mmHg  Pulse 62  Ht 6' (1.829 m) , BMI There is no weight on file to calculate BMI. GEN: Well nourished, well developed, in no acute distress HEENT: normal Neck: no JVD, carotid bruits, or masses Cardiac: Irregularly irregular.; no murmurs, rubs, or gallops,no edema  Respiratory:  clear to auscultation bilaterally, normal work of breathing GI: soft, nontender, nondistended, + BS MS: no deformity or atrophy Skin: warm and dry, no rash Neuro:  Strength and sensation are intact Psych: euthymic mood, full affect   EKG:  EKG is not ordered today.   Recent Labs: 09/03/2013: ALT 20; BUN 25*; Creatinine 1.4; Hemoglobin 15.1; Platelets 168.0; Potassium 4.5; Sodium 135    Lipid Panel    Component Value Date/Time   CHOL 122 09/04/2013 1445   TRIG 143.0 09/04/2013 1445   HDL 33.10* 09/04/2013 1445   CHOLHDL 4 09/04/2013 1445   VLDL 28.6 09/04/2013 1445   LDLCALC 60 09/04/2013 1445      Wt Readings from Last 3 Encounters:  09/03/13 226 lb 12.8 oz (102.876 kg)  05/04/13 233 lb (105.688 kg)  01/01/13 227 lb (102.967 kg)         ASSESSMENT AND PLAN:  1. permanent atrial fibrillation. 2. remote embolic stroke with expressive aphasia 3. history  of aortic stenosis 4. hypertensive heart disease without heart failure 5. Hypercholesterolemia on pravastatin 6. chronic diastolic heart failure  Plan: Continue current medication. He has been taking 2 40 mg Lasix tablets a day.  Lab work today pending. Recheck in 6 months for office visit and fasting lipid panel hepatic function panel and basal metabolic panel. Work today is pending   Current medicines are reviewed at length with the patient today.  The patient does not have concerns regarding medicines.  The following changes have been made:  no change  Labs/  tests ordered today include:   Orders Placed This Encounter  Procedures  . Basic metabolic panel  . Lipid panel  . Hepatic function panel  . Basic metabolic panel      Signed, Cassell Clement MD 06/11/2014 12:58 PM    [facility] [address] [place], [place], [place]  [address] Phone: [phone]; Fax: [phone]

## 2014-06-12 NOTE — Progress Notes (Signed)
Quick Note:  Please report to patient. The recent labs are stable. Continue same medication and careful diet. ______ 

## 2014-06-21 ENCOUNTER — Other Ambulatory Visit: Payer: Self-pay

## 2014-06-21 DIAGNOSIS — I119 Hypertensive heart disease without heart failure: Secondary | ICD-10-CM

## 2014-06-21 MED ORDER — LISINOPRIL 20 MG PO TABS: 20.0000 mg | ORAL_TABLET | Freq: Every day | ORAL | Status: DC

## 2014-08-06 ENCOUNTER — Ambulatory Visit (INDEPENDENT_AMBULATORY_CARE_PROVIDER_SITE_OTHER): Payer: Medicare Other | Admitting: *Deleted

## 2014-08-06 DIAGNOSIS — Z5181 Encounter for therapeutic drug level monitoring: Secondary | ICD-10-CM | POA: Diagnosis not present

## 2014-08-06 DIAGNOSIS — I4891 Unspecified atrial fibrillation: Secondary | ICD-10-CM | POA: Diagnosis not present

## 2014-08-06 DIAGNOSIS — Z7901 Long term (current) use of anticoagulants: Secondary | ICD-10-CM | POA: Diagnosis not present

## 2014-08-06 LAB — POCT INR: INR: 2.6

## 2014-09-10 ENCOUNTER — Other Ambulatory Visit: Payer: Self-pay | Admitting: Cardiology

## 2014-09-17 ENCOUNTER — Ambulatory Visit (INDEPENDENT_AMBULATORY_CARE_PROVIDER_SITE_OTHER): Payer: Medicare Other | Admitting: *Deleted

## 2014-09-17 DIAGNOSIS — I4891 Unspecified atrial fibrillation: Secondary | ICD-10-CM | POA: Diagnosis not present

## 2014-09-17 DIAGNOSIS — Z5181 Encounter for therapeutic drug level monitoring: Secondary | ICD-10-CM | POA: Diagnosis not present

## 2014-09-17 DIAGNOSIS — Z7901 Long term (current) use of anticoagulants: Secondary | ICD-10-CM | POA: Diagnosis not present

## 2014-09-17 LAB — POCT INR: INR: 2.6

## 2014-09-18 ENCOUNTER — Other Ambulatory Visit: Payer: Self-pay | Admitting: *Deleted

## 2014-09-18 MED ORDER — WARFARIN SODIUM 5 MG PO TABS: ORAL_TABLET | ORAL | Status: DC

## 2014-09-18 NOTE — Telephone Encounter (Signed)
Refill done as requested 

## 2014-09-24 ENCOUNTER — Other Ambulatory Visit: Payer: Self-pay | Admitting: Cardiology

## 2014-10-04 ENCOUNTER — Other Ambulatory Visit: Payer: Self-pay | Admitting: Cardiology

## 2014-10-05 ENCOUNTER — Other Ambulatory Visit: Payer: Self-pay | Admitting: Cardiology

## 2014-10-19 ENCOUNTER — Other Ambulatory Visit: Payer: Self-pay | Admitting: Cardiology

## 2014-10-23 ENCOUNTER — Other Ambulatory Visit: Payer: Self-pay | Admitting: Cardiology

## 2014-10-29 ENCOUNTER — Ambulatory Visit (INDEPENDENT_AMBULATORY_CARE_PROVIDER_SITE_OTHER): Payer: Medicare Other

## 2014-10-29 DIAGNOSIS — Z7901 Long term (current) use of anticoagulants: Secondary | ICD-10-CM | POA: Diagnosis not present

## 2014-10-29 DIAGNOSIS — Z5181 Encounter for therapeutic drug level monitoring: Secondary | ICD-10-CM

## 2014-10-29 DIAGNOSIS — I4891 Unspecified atrial fibrillation: Secondary | ICD-10-CM

## 2014-10-29 LAB — POCT INR: INR: 2.8

## 2014-10-31 ENCOUNTER — Ambulatory Visit (INDEPENDENT_AMBULATORY_CARE_PROVIDER_SITE_OTHER): Payer: Medicare Other | Admitting: Family Medicine

## 2014-10-31 ENCOUNTER — Encounter: Payer: Self-pay | Admitting: Family Medicine

## 2014-10-31 VITALS — BP 122/72 | HR 76 | Temp 97.4°F | Wt 229.5 lb

## 2014-10-31 DIAGNOSIS — R4189 Other symptoms and signs involving cognitive functions and awareness: Secondary | ICD-10-CM

## 2014-10-31 DIAGNOSIS — E7800 Pure hypercholesterolemia, unspecified: Secondary | ICD-10-CM

## 2014-10-31 DIAGNOSIS — I639 Cerebral infarction, unspecified: Secondary | ICD-10-CM

## 2014-10-31 DIAGNOSIS — N400 Enlarged prostate without lower urinary tract symptoms: Secondary | ICD-10-CM

## 2014-10-31 DIAGNOSIS — I10 Essential (primary) hypertension: Secondary | ICD-10-CM

## 2014-10-31 DIAGNOSIS — Z0000 Encounter for general adult medical examination without abnormal findings: Secondary | ICD-10-CM

## 2014-10-31 DIAGNOSIS — Z66 Do not resuscitate: Secondary | ICD-10-CM | POA: Insufficient documentation

## 2014-10-31 DIAGNOSIS — I5032 Chronic diastolic (congestive) heart failure: Secondary | ICD-10-CM

## 2014-10-31 DIAGNOSIS — Z7189 Other specified counseling: Secondary | ICD-10-CM | POA: Insufficient documentation

## 2014-10-31 DIAGNOSIS — M109 Gout, unspecified: Secondary | ICD-10-CM

## 2014-10-31 DIAGNOSIS — I4821 Permanent atrial fibrillation: Secondary | ICD-10-CM

## 2014-10-31 DIAGNOSIS — I350 Nonrheumatic aortic (valve) stenosis: Secondary | ICD-10-CM

## 2014-10-31 NOTE — Progress Notes (Addendum)
BP 122/72 mmHg  Pulse 76  Temp(Src) 97.4 F (36.3 C) (Oral)  Wt 229 lb 8 oz (104.101 kg)   CC: f/u visit, requests medicare wellness visit Subjective:    Patient ID: [name], male    DOB: 05/29/1925, 81 y.o.   MRN: [id number]  HPI: [name] is a 81 y.o. male presenting on 10/31/2014 for Follow-up   Last seen here 06/2012. Regularly sees Dr Patty Sermons for atrial fibrillation.  Cardiac history - h/o chronic atrial fibrillation, chronic diastolic CHF, HTN and HLD on pravastatin. 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 Last one with residual cognitive impairment.On coumadin and aspirin.  Gout - on allopurinol  daily. No recent flares.  Lab Results  Component Value Date   INR 2.8 10/29/2014   INR 2.6 09/17/2014   INR 2.6 08/06/2014     Preventative: Colon cancer screening - age out. No blood in stool or BM changes Prostate cancer screen - aged out Declines flu shot  Td 2007  Pneumonia - declines  zostavax - declines Advanced planning - working on this. Would want HCPOA to be niece Ashley Murrain. No prolonged life support. Will bring me copy when she completes this at home. Wants to be DNR - form filled out today.  Will fill out medicare wellness form and bring back to review.  Lives with wife 2 grown children live ([place] and [place]). Estranged from local son. Not close to sons. Sees grandchildren regularly. Occupation: retired, prior Personnel officer  Relevant past medical, surgical, family and social history reviewed and updated as indicated. Interim medical history since our last visit reviewed. Allergies and medications reviewed and updated. Current Outpatient Prescriptions on File Prior to Visit  Medication Sig  . allopurinol (ZYLOPRIM) 100 MG tablet TAKE ONE TABLET BY MOUTH ONCE DAILY  . aspirin 81 MG tablet Take 81 mg by mouth daily.    . digoxin (LANOXIN) 0.125 MG tablet TAKE ONE TABLET BY MOUTH EVERY OTHER DAY  .  finasteride (PROSCAR) 5 MG tablet Take 5 mg by mouth daily.    . furosemide (LASIX) 40 MG tablet Take 80 mg by mouth daily.  Marland Kitchen lisinopril (PRINIVIL,ZESTRIL) 20 MG tablet Take 1 tablet (20 mg total) by mouth daily. PM  . lisinopril-hydrochlorothiazide (PRINZIDE,ZESTORETIC) 20-12.5 MG per tablet TAKE ONE TABLET BY MOUTH IN THE MORNING  . metoprolol succinate (TOPROL-XL) 25 MG 24 hr tablet TAKE ONE-HALF TABLET BY MOUTH ONCE DAILY  . Multiple Vitamins-Minerals (PRESERVISION AREDS 2) CAPS Take 1 capsule by mouth daily. Without beta carotene  . nitroGLYCERIN (NITROSTAT) 0.4 MG SL tablet Place 0.4 mg under the tongue every 5 (five) minutes as needed (for chest pain).   . Omega-3 Fatty Acids (FISH OIL) 1000 MG CAPS Take 1,000 mg by mouth daily.   Marland Kitchen oxybutynin (DITROPAN-XL) 5 MG 24 hr tablet Take 5 mg by mouth daily.  . pravastatin (PRAVACHOL) 40 MG tablet TAKE ONE-HALF TABLET BY MOUTH ONCE DAILY  . warfarin (COUMADIN) 5 MG tablet Take as directed by coumadin clinic   No current facility-administered medications on file prior to visit.    Review of Systems  Constitutional: Negative for fever, chills, activity change, appetite change, fatigue and unexpected weight change.  HENT: Negative for hearing loss.   Eyes: Negative for visual disturbance.  Respiratory: Negative for cough, chest tightness, shortness of breath and wheezing.   Cardiovascular: Negative for chest pain, palpitations and leg swelling.  Gastrointestinal: Negative for nausea, vomiting, abdominal pain, diarrhea, constipation, blood  in stool and abdominal distention.  Genitourinary: Negative for hematuria and difficulty urinating.  Musculoskeletal: Negative for myalgias, arthralgias and neck pain.  Skin: Negative for rash.  Neurological: Negative for dizziness, seizures, syncope and headaches.  Hematological: Negative for adenopathy. Does not bruise/bleed easily.  Psychiatric/Behavioral: Negative for dysphoric mood. The patient is not  nervous/anxious.    Per HPI unless specifically indicated above     Objective:    BP 122/72 mmHg  Pulse 76  Temp(Src) 97.4 F (36.3 C) (Oral)  Wt 229 lb 8 oz (104.101 kg)  Wt Readings from Last 3 Encounters:  10/31/14 229 lb 8 oz (104.101 kg)  09/03/13 226 lb 12.8 oz (102.876 kg)  05/04/13 233 lb (105.688 kg)   Body mass index is 31.12 kg/(m^2).  Physical Exam  Constitutional: He is oriented to person, place, and time. He appears well-developed and well-nourished. No distress.  Pleasant Uses walker when going out  HENT:  Head: Normocephalic and atraumatic.  Right Ear: Hearing, tympanic membrane, external ear and ear canal normal.  Left Ear: Hearing, tympanic membrane, external ear and ear canal normal.  Nose: Nose normal.  Mouth/Throat: Uvula is midline, oropharynx is clear and moist and mucous membranes are normal. No oropharyngeal exudate, posterior oropharyngeal edema or posterior oropharyngeal erythema.  Eyes: Conjunctivae and EOM are normal. Pupils are equal, round, and reactive to light. No scleral icterus.  Neck: Normal range of motion. Neck supple. Carotid bruit is not present. No thyromegaly present.  Cardiovascular: Normal rate, normal heart sounds and intact distal pulses.  An irregularly irregular rhythm present.  No murmur heard. Pulses:      Radial pulses are 2+ on the right side, and 2+ on the left side.  Pulmonary/Chest: Effort normal and breath sounds normal. No respiratory distress. He has no wheezes. He has no rales.  Abdominal: Soft. Bowel sounds are normal. He exhibits no distension and no mass. There is no tenderness. There is no rebound and no guarding.  Musculoskeletal: Normal range of motion. He exhibits edema (tr).  Lymphadenopathy:    He has no cervical adenopathy.  Neurological: He is alert and oriented to person, place, and time.  Residual expressive aphasia and cognitive impairment  Skin: Skin is warm and dry. No rash noted.  Psychiatric: He has  a normal mood and affect. His behavior is normal. Judgment and thought content normal.  Nursing note and vitals reviewed.  Results for orders placed or performed in visit on 10/29/14  POCT INR  Result Value Ref Range   INR 2.8       Assessment & Plan:   Problem List Items Addressed This Visit    Permanent atrial fibrillation (HCC)    Rate controlled. Continue coumadin.      Medicare annual wellness visit, subsequent - Primary    I have personally reviewed the Medicare Annual Wellness questionnaire and have noted 1. The patient's medical and social history 2. Their use of alcohol, tobacco or illicit drugs 3. Their current medications and supplements 4. The patient's functional ability including ADL's, fall risks, home safety risks and hearing or visual impairment. Cognitive function has been assessed and addressed as indicated.  5. Diet and physical activity 6. Evidence for depression or mood disorders The patients weight, height, BMI have been recorded in the chart. I have made referrals, counseling and provided education to the patient based on review of the above and I have provided the pt with a written personalized care plan for preventive services. Provider list updated.Marland Kitchen  See scanned questionairre as needed for further documentation - pt to bring form back next week. Reviewed preventative protocols and updated unless pt declined.       Hypercholesterolemia    Chronic, stable. Continue current regimen (pravastatin  daily). Pending FLP ordered by cards.      Gout    Chronic, stable on low dose allopurinol. No recent gout flares.      Essential hypertension    Chronic, stable. Continue current regimen.      DNR (do not resuscitate)   Cognitive impairment    Chronic, stable. Some expressive aphasia residual after stroke. Wife is caregiver.       Chronic diastolic congestive heart failure (HCC)    Seems euvolemic today. Continue current regimen.       Cerebrovascular accident, embolic (HCC)    Continue aspirin, coumadin.       BPH (benign prostatic hyperplasia)    Continue finasteride. Denies sxs.      Aortic stenosis, mild    No significant dyspnea. Continue to monitor. Followed by cards. No recent echo.      Advanced care planning/counseling discussion    Advanced planning - working on this. Would want HCPOA to be niece Ashley Murrain. No prolonged life support. Will bring me copy when she completes this at home. Wants to be DNR - form filled out today.          Follow up plan: Return in about 1 year (around 10/31/2015), or as needed, for medicare wellness visit.

## 2014-10-31 NOTE — Patient Instructions (Addendum)
Nice to see you today, call us with questions.  Advanced directive packet provided today.  We've filled out DNR (do not resuscitate) form.  Fill out medicare wellness questionairre and bring back next week.  Return as needed or in 1 year for next physical.

## 2014-10-31 NOTE — Assessment & Plan Note (Signed)
Chronic, stable on low dose allopurinol. No recent gout flares.

## 2014-10-31 NOTE — Assessment & Plan Note (Signed)
Continue aspirin, coumadin.

## 2014-10-31 NOTE — Assessment & Plan Note (Signed)
Seems euvolemic today. Continue current regimen.

## 2014-10-31 NOTE — Assessment & Plan Note (Signed)
Rate controlled. Continue coumadin.

## 2014-10-31 NOTE — Assessment & Plan Note (Signed)
Chronic, stable. Continue current regimen (pravastatin  daily). Pending FLP ordered by cards.

## 2014-10-31 NOTE — Progress Notes (Signed)
Pre visit review using our clinic review tool, if applicable. No additional management support is needed unless otherwise documented below in the visit note. 

## 2014-10-31 NOTE — Assessment & Plan Note (Signed)
Continue finasteride. Denies sxs.

## 2014-10-31 NOTE — Assessment & Plan Note (Signed)
Advanced planning - working on this. Would want HCPOA to be niece [name]. No prolonged life support. Will bring me copy when she completes this at home. Wants to be DNR - form filled out today. 

## 2014-10-31 NOTE — Assessment & Plan Note (Addendum)
I have personally reviewed the Medicare Annual Wellness questionnaire and have noted 1. The patient's medical and social history 2. Their use of alcohol, tobacco or illicit drugs 3. Their current medications and supplements 4. The patient's functional ability including ADL's, fall risks, home safety risks and hearing or visual impairment. Cognitive function has been assessed and addressed as indicated.  5. Diet and physical activity 6. Evidence for depression or mood disorders The patients weight, height, BMI have been recorded in the chart. I have made referrals, counseling and provided education to the patient based on review of the above and I have provided the pt with a written personalized care plan for preventive services. Provider list updated.. See scanned questionairre as needed for further documentation - pt to bring form back next week. Reviewed preventative protocols and updated unless pt declined.

## 2014-10-31 NOTE — Assessment & Plan Note (Signed)
Chronic, stable. Some expressive aphasia residual after stroke. Wife is caregiver.

## 2014-10-31 NOTE — Assessment & Plan Note (Signed)
No significant dyspnea. Continue to monitor. Followed by cards. No recent echo.

## 2014-10-31 NOTE — Assessment & Plan Note (Signed)
Chronic, stable. Continue current regimen. 

## 2014-11-13 ENCOUNTER — Other Ambulatory Visit: Payer: Self-pay | Admitting: Cardiology

## 2014-12-01 NOTE — Addendum Note (Signed)
Addended by: Eustaquio BoydenGUTIERREZ, Chaniah Cisse on: 12/01/2014 01:20 PM   Modules accepted: Level of Service

## 2014-12-10 ENCOUNTER — Ambulatory Visit (INDEPENDENT_AMBULATORY_CARE_PROVIDER_SITE_OTHER): Payer: Medicare Other | Admitting: *Deleted

## 2014-12-10 DIAGNOSIS — I4891 Unspecified atrial fibrillation: Secondary | ICD-10-CM | POA: Diagnosis not present

## 2014-12-10 DIAGNOSIS — Z5181 Encounter for therapeutic drug level monitoring: Secondary | ICD-10-CM | POA: Diagnosis not present

## 2014-12-10 DIAGNOSIS — Z7901 Long term (current) use of anticoagulants: Secondary | ICD-10-CM | POA: Diagnosis not present

## 2014-12-10 DIAGNOSIS — I4821 Permanent atrial fibrillation: Secondary | ICD-10-CM

## 2014-12-10 DIAGNOSIS — I482 Chronic atrial fibrillation: Secondary | ICD-10-CM | POA: Diagnosis not present

## 2014-12-10 LAB — POCT INR: INR: 1.6

## 2014-12-20 ENCOUNTER — Ambulatory Visit (INDEPENDENT_AMBULATORY_CARE_PROVIDER_SITE_OTHER): Payer: Medicare Other | Admitting: Cardiology

## 2014-12-20 ENCOUNTER — Ambulatory Visit (INDEPENDENT_AMBULATORY_CARE_PROVIDER_SITE_OTHER): Payer: Medicare Other | Admitting: *Deleted

## 2014-12-20 ENCOUNTER — Encounter: Payer: Self-pay | Admitting: Cardiology

## 2014-12-20 VITALS — BP 126/64 | HR 72 | Ht 72.0 in | Wt 227.1 lb

## 2014-12-20 DIAGNOSIS — E7800 Pure hypercholesterolemia, unspecified: Secondary | ICD-10-CM | POA: Diagnosis not present

## 2014-12-20 DIAGNOSIS — I482 Chronic atrial fibrillation, unspecified: Secondary | ICD-10-CM

## 2014-12-20 DIAGNOSIS — Z5181 Encounter for therapeutic drug level monitoring: Secondary | ICD-10-CM

## 2014-12-20 DIAGNOSIS — I4821 Permanent atrial fibrillation: Secondary | ICD-10-CM

## 2014-12-20 DIAGNOSIS — I119 Hypertensive heart disease without heart failure: Secondary | ICD-10-CM

## 2014-12-20 DIAGNOSIS — Z7901 Long term (current) use of anticoagulants: Secondary | ICD-10-CM

## 2014-12-20 DIAGNOSIS — I4891 Unspecified atrial fibrillation: Secondary | ICD-10-CM | POA: Diagnosis not present

## 2014-12-20 LAB — HEPATIC FUNCTION PANEL
ALT: 17 U/L (ref 9–46)
AST: 22 U/L (ref 10–35)
Albumin: 4.3 g/dL (ref 3.6–5.1)
Alkaline Phosphatase: 52 U/L (ref 40–115)
BILIRUBIN INDIRECT: 0.6 mg/dL (ref 0.2–1.2)
Bilirubin, Direct: 0.2 mg/dL (ref <=0.2)
TOTAL PROTEIN: 7.1 g/dL (ref 6.1–8.1)
Total Bilirubin: 0.8 mg/dL (ref 0.2–1.2)

## 2014-12-20 LAB — BASIC METABOLIC PANEL
BUN: 26 mg/dL — AB (ref 7–25)
CO2: 23 mmol/L (ref 20–31)
Calcium: 10.3 mg/dL (ref 8.6–10.3)
Chloride: 101 mmol/L (ref 98–110)
Creat: 1.3 mg/dL — ABNORMAL HIGH (ref 0.70–1.11)
Glucose, Bld: 97 mg/dL (ref 65–99)
POTASSIUM: 4.2 mmol/L (ref 3.5–5.3)
Sodium: 137 mmol/L (ref 135–146)

## 2014-12-20 LAB — LIPID PANEL
CHOL/HDL RATIO: 3 ratio (ref <=5.0)
CHOLESTEROL: 125 mg/dL (ref 125–200)
HDL: 42 mg/dL (ref >=40)
LDL Cholesterol: 63 mg/dL (ref <130)
TRIGLYCERIDES: 98 mg/dL (ref <150)
VLDL: 20 mg/dL (ref <30)

## 2014-12-20 LAB — POCT INR: INR: 2.4

## 2014-12-20 NOTE — Patient Instructions (Signed)
Medication Instructions:  Your physician recommends that you continue on your current medications as directed. Please refer to the Current Medication list given to you today.  Labwork: LP/BMET/HFP  Testing/Procedures: NONE  Follow-Up: Your physician wants you to follow-up in: 6 months with fasting labs (lp/bmet/hfp) WITH DR NAHSER  You will receive a reminder letter in the mail two months in advance. If you don't receive a letter, please call our office to schedule the follow-up appointment.  If you need a refill on your cardiac medications before your next appointment, please call your pharmacy.  

## 2014-12-20 NOTE — Progress Notes (Signed)
Cardiology Office Note   Date:  12/20/2014   ID:  [name], DOB 05-23-1925, MRN [id number]  PCP:  Eustaquio BoydenJavier Gutierrez, MD  Cardiologist: Cassell Clementhomas Juanya Villavicencio MD  No chief complaint on file.     History of Present Illness: [name] is a 81 y.o. male who presents for scheduled follow-up visit   This pleasant 81 year old gentleman is seen for a four-month followup office visit. He has a history of established chronic atrial fibrillation. 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 He also has a history of chronic diastolic congestive heart failure. He has not had a cardiac catheterization. He does not have any history of ischemic heart disease. However an adenosine Cardiolite stress test in December 2009 showed evidence of an old scar of the septum and a larger apical scar but no reversible ischemia. The patient had an echocardiogram in November 2009 showing an ejection fraction 55-60% with mild aortic stenosis and mild pulmonary hypertension and mild mitral regurgitation. The patient is on long-term Coumadin for his chronic atrial fibrillation.  The patient continues to have exertional dyspnea which is stable. He has not been having any peripheral edema. He is having more of a problem with bladder urgency. He is no longer able to attend church.  Past Medical History  Diagnosis Date  . CHF (congestive heart failure) (HCC)   . Atrial fibrillation (HCC)     chronic  . CAD (coronary artery disease)     s/p MI, unsure when  . CVA (cerebrovascular accident) (HCC) x4 (latest 2007)    embolic, residual cognitive impairment  . DOE (dyspnea on exertion)   . Hyperlipidemia   . HTN (hypertension)   . Cognitive impairment     from stroke    Past Surgical History  Procedure Laterality Date  . Appendectomy  1994  . Hemorrhoid surgery    . Cardiovascular stress test  01/11/2008    evidence of an old scar of the septum and a larger  apical scar but no reversible ischemia, EF 48%  . [place] echocardiography  12/19/2007    EF 55-60%, mild aortic stenosis and mild pulmonary hypertension and mild mitral regurgitation  . Tonsillectomy  1964  . Excision morton's neuroma  1995    x 2  . Cataract extraction Left 2008    E[facility]     Current Outpatient Prescriptions  Medication Sig Dispense Refill  . allopurinol (ZYLOPRIM) 100 MG tablet TAKE ONE TABLET BY MOUTH ONCE DAILY 90 tablet 0  . aspirin 81 MG tablet Take 81 mg by mouth daily.      . digoxin (LANOXIN) 0.125 MG tablet TAKE ONE TABLET BY MOUTH EVERY OTHER DAY 45 tablet 3  . finasteride (PROSCAR) 5 MG tablet Take 5 mg by mouth daily.      . furosemide (LASIX) 40 MG tablet Take 80 mg by mouth daily.    Marland Kitchen. lisinopril (PRINIVIL,ZESTRIL) 20 MG tablet Take 1 tablet (20 mg total) by mouth daily. PM 90 tablet 1  . lisinopril-hydrochlorothiazide (PRINZIDE,ZESTORETIC) 20-12.5 MG per tablet TAKE ONE TABLET BY MOUTH IN THE MORNING 90 tablet 1  . metoprolol succinate (TOPROL-XL) 25 MG 24 hr tablet TAKE ONE-HALF TABLET BY MOUTH ONCE DAILY 45 tablet 0  . Multiple Vitamins-Minerals (PRESERVISION AREDS 2) CAPS Take 1 capsule by mouth daily. Without beta carotene    . nitroGLYCERIN (NITROSTAT) 0.4 MG SL tablet Place 0.4 mg under the tongue every 5 (five) minutes as needed (for chest  pain).     . Omega-3 Fatty Acids (FISH OIL) 1000 MG CAPS Take 1,000 mg by mouth daily.     Marland Kitchen oxybutynin (DITROPAN-XL) 5 MG 24 hr tablet Take 5 mg by mouth daily.    . pravastatin (PRAVACHOL) 40 MG tablet TAKE ONE-HALF TABLET BY MOUTH ONCE DAILY 45 tablet 0  . psyllium (METAMUCIL) 58.6 % packet Take 1 packet by mouth daily as needed (constipation).     . warfarin (COUMADIN) 5 MG tablet Take as directed by coumadin clinic 180 tablet 0   No current facility-administered medications for this visit.    Allergies:   Benazepril hcl    Social History:  The patient  reports that he quit smoking about 64 years ago. He has  never used smokeless tobacco. He reports that he does not drink alcohol or use illicit drugs.   Family History:  The patient's family history includes Alcohol abuse in his brother; CAD in his sister; Cancer in his brother and sister.    ROS:  Please see the history of present illness.   Otherwise, review of systems are positive for none.   All other systems are reviewed and negative.    PHYSICAL EXAM: VS:  BP 126/64 mmHg  Pulse 72  Ht 6' (1.829 m)  Wt 227 lb 1.9 oz (103.021 kg)  BMI 30.80 kg/m2 , BMI Body mass index is 30.8 kg/(m^2). GEN: Well nourished, well developed, in no acute distress HEENT: normal Neck: no JVD, carotid bruits, or masses Cardiac: Irregularly irregular.  Soft apical systolic murmur at apex.  There are no rubs, or gallops, there is mild peripheral edema. Respiratory:  clear to auscultation bilaterally, normal work of breathing.  There are a few fine inspiratory rales at the right base. GI: soft, nontender, nondistended, + BS MS: no deformity or atrophy Skin: warm and dry, no rash Neuro:  Strength and sensation are intact.the patient has partial expressive aphasia and difficulty finding words.   Psych: euthymic mood, full affect   EKG:  EKG is ordered today. The ekg ordered today demonstrates atrial fibrillation with controlled ventricular response.  Left axis deviation and right bundle-branch block.  Occasional PVCs.   Recent Labs: 06/11/2014: BUN 17; Creatinine, Ser 1.27; Potassium 4.1; Sodium 137    Lipid Panel    Component Value Date/Time   CHOL 122 09/04/2013 1445   TRIG 143.0 09/04/2013 1445   HDL 33.10* 09/04/2013 1445   CHOLHDL 4 09/04/2013 1445   VLDL 28.6 09/04/2013 1445   LDLCALC 60 09/04/2013 1445      Wt Readings from Last 3 Encounters:  12/20/14 227 lb 1.9 oz (103.021 kg)  10/31/14 229 lb 8 oz (104.101 kg)  09/03/13 226 lb 12.8 oz (102.876 kg)        ASSESSMENT AND PLAN:  1. permanent atrial fibrillation. 2. remote embolic  stroke with expressive aphasia 3. history of aortic stenosis 4. hypertensive heart disease without heart failure 5. Hypercholesterolemia on pravastatin 6. chronic diastolic heart failure  Plan: Continue current medication. He has been taking 2 40 mg Lasix tablets a day.  Lab work today pending. Recheck in 6 months for office visit and fasting lipid panel hepatic function panel and basal metabolic panel.  Following my retirement he will see Dr. Elease Hashimoto.    Current medicines are reviewed at length with the patient today.  The patient does not have concerns regarding medicines.  The following changes have been made:  no change  Labs/ tests ordered today include:  Orders Placed This Encounter  Procedures  . Lipid panel  . Hepatic function panel  . Basic metabolic panel  . EKG 12-Lead      Signed, Cassell Clement MD 12/20/2014 1:19 PM    [facility] [address] [place], [place], [place]  [address] Phone: [phone]; Fax: [phone]

## 2014-12-21 ENCOUNTER — Other Ambulatory Visit: Payer: Self-pay | Admitting: Cardiology

## 2014-12-22 NOTE — Progress Notes (Signed)
Quick Note:  Please report to patient. The recent labs are stable. Continue same medication and careful diet. ______ 

## 2015-01-04 ENCOUNTER — Other Ambulatory Visit: Payer: Self-pay | Admitting: Cardiology

## 2015-01-16 ENCOUNTER — Other Ambulatory Visit: Payer: Self-pay | Admitting: Cardiology

## 2015-01-18 ENCOUNTER — Other Ambulatory Visit: Payer: Self-pay | Admitting: Cardiology

## 2015-03-10 ENCOUNTER — Ambulatory Visit (INDEPENDENT_AMBULATORY_CARE_PROVIDER_SITE_OTHER): Payer: Medicare Other | Admitting: *Deleted

## 2015-03-10 DIAGNOSIS — I4891 Unspecified atrial fibrillation: Secondary | ICD-10-CM

## 2015-03-10 DIAGNOSIS — Z7901 Long term (current) use of anticoagulants: Secondary | ICD-10-CM | POA: Diagnosis not present

## 2015-03-10 DIAGNOSIS — Z5181 Encounter for therapeutic drug level monitoring: Secondary | ICD-10-CM | POA: Diagnosis not present

## 2015-03-10 DIAGNOSIS — I482 Chronic atrial fibrillation: Secondary | ICD-10-CM | POA: Diagnosis not present

## 2015-03-10 DIAGNOSIS — I4821 Permanent atrial fibrillation: Secondary | ICD-10-CM

## 2015-03-10 LAB — POCT INR: INR: 2.1

## 2015-03-15 ENCOUNTER — Other Ambulatory Visit: Payer: Self-pay | Admitting: Cardiology

## 2015-04-01 DIAGNOSIS — Z9841 Cataract extraction status, right eye: Secondary | ICD-10-CM | POA: Diagnosis not present

## 2015-04-01 DIAGNOSIS — Z9842 Cataract extraction status, left eye: Secondary | ICD-10-CM | POA: Diagnosis not present

## 2015-04-01 DIAGNOSIS — H353131 Nonexudative age-related macular degeneration, bilateral, early dry stage: Secondary | ICD-10-CM | POA: Diagnosis not present

## 2015-04-01 DIAGNOSIS — H35341 Macular cyst, hole, or pseudohole, right eye: Secondary | ICD-10-CM | POA: Diagnosis not present

## 2015-04-21 ENCOUNTER — Ambulatory Visit (INDEPENDENT_AMBULATORY_CARE_PROVIDER_SITE_OTHER): Payer: Medicare Other | Admitting: *Deleted

## 2015-04-21 DIAGNOSIS — I4891 Unspecified atrial fibrillation: Secondary | ICD-10-CM | POA: Diagnosis not present

## 2015-04-21 DIAGNOSIS — Z7901 Long term (current) use of anticoagulants: Secondary | ICD-10-CM

## 2015-04-21 DIAGNOSIS — Z5181 Encounter for therapeutic drug level monitoring: Secondary | ICD-10-CM | POA: Diagnosis not present

## 2015-04-21 DIAGNOSIS — I482 Chronic atrial fibrillation: Secondary | ICD-10-CM | POA: Diagnosis not present

## 2015-04-21 DIAGNOSIS — I4821 Permanent atrial fibrillation: Secondary | ICD-10-CM

## 2015-04-21 LAB — POCT INR: INR: 2.1

## 2015-05-17 ENCOUNTER — Other Ambulatory Visit: Payer: Self-pay | Admitting: Cardiology

## 2015-06-02 ENCOUNTER — Ambulatory Visit (INDEPENDENT_AMBULATORY_CARE_PROVIDER_SITE_OTHER): Payer: Medicare Other

## 2015-06-02 DIAGNOSIS — I482 Chronic atrial fibrillation: Secondary | ICD-10-CM | POA: Diagnosis not present

## 2015-06-02 DIAGNOSIS — Z7901 Long term (current) use of anticoagulants: Secondary | ICD-10-CM | POA: Diagnosis not present

## 2015-06-02 DIAGNOSIS — I4821 Permanent atrial fibrillation: Secondary | ICD-10-CM

## 2015-06-02 DIAGNOSIS — I4891 Unspecified atrial fibrillation: Secondary | ICD-10-CM

## 2015-06-02 DIAGNOSIS — Z5181 Encounter for therapeutic drug level monitoring: Secondary | ICD-10-CM | POA: Diagnosis not present

## 2015-06-02 LAB — POCT INR: INR: 2.5

## 2015-07-08 ENCOUNTER — Ambulatory Visit (INDEPENDENT_AMBULATORY_CARE_PROVIDER_SITE_OTHER): Payer: Medicare Other | Admitting: Pharmacist

## 2015-07-08 DIAGNOSIS — I4891 Unspecified atrial fibrillation: Secondary | ICD-10-CM

## 2015-07-08 DIAGNOSIS — I4821 Permanent atrial fibrillation: Secondary | ICD-10-CM

## 2015-07-08 DIAGNOSIS — Z7901 Long term (current) use of anticoagulants: Secondary | ICD-10-CM | POA: Diagnosis not present

## 2015-07-08 DIAGNOSIS — I482 Chronic atrial fibrillation: Secondary | ICD-10-CM | POA: Diagnosis not present

## 2015-07-08 DIAGNOSIS — Z5181 Encounter for therapeutic drug level monitoring: Secondary | ICD-10-CM

## 2015-07-08 LAB — POCT INR: INR: 3.1

## 2015-07-29 ENCOUNTER — Ambulatory Visit (INDEPENDENT_AMBULATORY_CARE_PROVIDER_SITE_OTHER): Payer: Medicare Other | Admitting: *Deleted

## 2015-07-29 DIAGNOSIS — I482 Chronic atrial fibrillation: Secondary | ICD-10-CM | POA: Diagnosis not present

## 2015-07-29 DIAGNOSIS — Z5181 Encounter for therapeutic drug level monitoring: Secondary | ICD-10-CM

## 2015-07-29 DIAGNOSIS — I4821 Permanent atrial fibrillation: Secondary | ICD-10-CM

## 2015-07-29 DIAGNOSIS — Z7901 Long term (current) use of anticoagulants: Secondary | ICD-10-CM | POA: Diagnosis not present

## 2015-07-29 DIAGNOSIS — I4891 Unspecified atrial fibrillation: Secondary | ICD-10-CM

## 2015-07-29 LAB — POCT INR: INR: 2.9

## 2015-07-30 ENCOUNTER — Other Ambulatory Visit: Payer: Self-pay | Admitting: *Deleted

## 2015-07-30 MED ORDER — PRAVASTATIN SODIUM 40 MG PO TABS: 20.0000 mg | ORAL_TABLET | Freq: Every day | ORAL | Status: DC

## 2015-08-04 ENCOUNTER — Other Ambulatory Visit: Payer: Self-pay | Admitting: *Deleted

## 2015-08-04 MED ORDER — WARFARIN SODIUM 5 MG PO TABS: 5.0000 mg | ORAL_TABLET | ORAL | Status: DC

## 2015-08-27 ENCOUNTER — Ambulatory Visit (INDEPENDENT_AMBULATORY_CARE_PROVIDER_SITE_OTHER): Payer: Medicare Other | Admitting: *Deleted

## 2015-08-27 ENCOUNTER — Encounter (INDEPENDENT_AMBULATORY_CARE_PROVIDER_SITE_OTHER): Payer: Self-pay

## 2015-08-27 DIAGNOSIS — I482 Chronic atrial fibrillation: Secondary | ICD-10-CM | POA: Diagnosis not present

## 2015-08-27 DIAGNOSIS — Z7901 Long term (current) use of anticoagulants: Secondary | ICD-10-CM | POA: Diagnosis not present

## 2015-08-27 DIAGNOSIS — Z5181 Encounter for therapeutic drug level monitoring: Secondary | ICD-10-CM

## 2015-08-27 DIAGNOSIS — I4891 Unspecified atrial fibrillation: Secondary | ICD-10-CM | POA: Diagnosis not present

## 2015-08-27 DIAGNOSIS — I4821 Permanent atrial fibrillation: Secondary | ICD-10-CM

## 2015-08-27 LAB — POCT INR: INR: 2.8

## 2015-09-24 ENCOUNTER — Ambulatory Visit (INDEPENDENT_AMBULATORY_CARE_PROVIDER_SITE_OTHER): Payer: Medicare Other | Admitting: *Deleted

## 2015-09-24 DIAGNOSIS — Z5181 Encounter for therapeutic drug level monitoring: Secondary | ICD-10-CM

## 2015-09-24 DIAGNOSIS — I482 Chronic atrial fibrillation: Secondary | ICD-10-CM | POA: Diagnosis not present

## 2015-09-24 DIAGNOSIS — Z7901 Long term (current) use of anticoagulants: Secondary | ICD-10-CM | POA: Diagnosis not present

## 2015-09-24 DIAGNOSIS — I4891 Unspecified atrial fibrillation: Secondary | ICD-10-CM | POA: Diagnosis not present

## 2015-09-24 DIAGNOSIS — I4821 Permanent atrial fibrillation: Secondary | ICD-10-CM

## 2015-09-24 LAB — POCT INR: INR: 2.9

## 2015-10-22 ENCOUNTER — Ambulatory Visit (INDEPENDENT_AMBULATORY_CARE_PROVIDER_SITE_OTHER): Payer: Medicare Other | Admitting: *Deleted

## 2015-10-22 DIAGNOSIS — I4891 Unspecified atrial fibrillation: Secondary | ICD-10-CM | POA: Diagnosis not present

## 2015-10-22 DIAGNOSIS — I4821 Permanent atrial fibrillation: Secondary | ICD-10-CM

## 2015-10-22 DIAGNOSIS — I482 Chronic atrial fibrillation: Secondary | ICD-10-CM

## 2015-10-22 DIAGNOSIS — Z7901 Long term (current) use of anticoagulants: Secondary | ICD-10-CM

## 2015-10-22 DIAGNOSIS — Z5181 Encounter for therapeutic drug level monitoring: Secondary | ICD-10-CM | POA: Diagnosis not present

## 2015-10-22 LAB — POCT INR: INR: 2.3

## 2015-11-05 ENCOUNTER — Other Ambulatory Visit: Payer: Self-pay | Admitting: Cardiovascular Disease

## 2015-11-19 ENCOUNTER — Ambulatory Visit (INDEPENDENT_AMBULATORY_CARE_PROVIDER_SITE_OTHER): Payer: Medicare Other | Admitting: *Deleted

## 2015-11-19 DIAGNOSIS — Z7901 Long term (current) use of anticoagulants: Secondary | ICD-10-CM

## 2015-11-19 DIAGNOSIS — I4891 Unspecified atrial fibrillation: Secondary | ICD-10-CM | POA: Diagnosis not present

## 2015-11-19 DIAGNOSIS — I4821 Permanent atrial fibrillation: Secondary | ICD-10-CM

## 2015-11-19 DIAGNOSIS — I482 Chronic atrial fibrillation: Secondary | ICD-10-CM

## 2015-11-19 DIAGNOSIS — Z5181 Encounter for therapeutic drug level monitoring: Secondary | ICD-10-CM

## 2015-11-19 LAB — POCT INR: INR: 1.8

## 2015-12-06 ENCOUNTER — Encounter (HOSPITAL_COMMUNITY): Payer: Self-pay | Admitting: *Deleted

## 2015-12-06 ENCOUNTER — Emergency Department (HOSPITAL_COMMUNITY)
Admission: EM | Admit: 2015-12-06 | Discharge: 2015-12-06 | Disposition: A | Discharge status: Home / Self Care | Payer: Medicare Other | Attending: Emergency Medicine | Admitting: Emergency Medicine

## 2015-12-06 ENCOUNTER — Emergency Department (HOSPITAL_COMMUNITY): Payer: Medicare Other

## 2015-12-06 DIAGNOSIS — R4182 Altered mental status, unspecified: Secondary | ICD-10-CM

## 2015-12-06 DIAGNOSIS — I110 Hypertensive heart disease with heart failure: Secondary | ICD-10-CM | POA: Diagnosis not present

## 2015-12-06 DIAGNOSIS — Z8673 Personal history of transient ischemic attack (TIA), and cerebral infarction without residual deficits: Secondary | ICD-10-CM | POA: Diagnosis not present

## 2015-12-06 DIAGNOSIS — I5032 Chronic diastolic (congestive) heart failure: Secondary | ICD-10-CM | POA: Insufficient documentation

## 2015-12-06 DIAGNOSIS — R52 Pain, unspecified: Secondary | ICD-10-CM | POA: Diagnosis not present

## 2015-12-06 DIAGNOSIS — M25531 Pain in right wrist: Secondary | ICD-10-CM | POA: Diagnosis not present

## 2015-12-06 DIAGNOSIS — Z7982 Long term (current) use of aspirin: Secondary | ICD-10-CM | POA: Diagnosis not present

## 2015-12-06 DIAGNOSIS — I2510 Atherosclerotic heart disease of native coronary artery without angina pectoris: Secondary | ICD-10-CM | POA: Diagnosis not present

## 2015-12-06 DIAGNOSIS — Z8739 Personal history of other diseases of the musculoskeletal system and connective tissue: Secondary | ICD-10-CM | POA: Diagnosis not present

## 2015-12-06 DIAGNOSIS — Z87891 Personal history of nicotine dependence: Secondary | ICD-10-CM | POA: Insufficient documentation

## 2015-12-06 DIAGNOSIS — M79605 Pain in left leg: Secondary | ICD-10-CM | POA: Diagnosis not present

## 2015-12-06 DIAGNOSIS — Z7901 Long term (current) use of anticoagulants: Secondary | ICD-10-CM | POA: Diagnosis not present

## 2015-12-06 DIAGNOSIS — J9811 Atelectasis: Secondary | ICD-10-CM | POA: Diagnosis not present

## 2015-12-06 HISTORY — DX: Gout, unspecified: M10.9

## 2015-12-06 LAB — URINALYSIS, ROUTINE W REFLEX MICROSCOPIC
Bilirubin Urine: NEGATIVE
GLUCOSE, UA: NEGATIVE mg/dL
Ketones, ur: NEGATIVE mg/dL
Nitrite: NEGATIVE
PH: 6.5 (ref 5.0–8.0)
Specific Gravity, Urine: 1.017 (ref 1.005–1.030)

## 2015-12-06 LAB — CBC WITH DIFFERENTIAL/PLATELET
BAND NEUTROPHILS: 0 %
BASOS ABS: 0 10*3/uL (ref 0.0–0.1)
BLASTS: 0 %
Basophils Relative: 0 %
EOS ABS: 0 10*3/uL (ref 0.0–0.7)
Eosinophils Relative: 0 %
HEMATOCRIT: 39.9 % (ref 39.0–52.0)
Hemoglobin: 13.5 g/dL (ref 13.0–17.0)
Lymphocytes Relative: 12 %
Lymphs Abs: 1.6 10*3/uL (ref 0.7–4.0)
MCH: 30.8 pg (ref 26.0–34.0)
MCHC: 33.8 g/dL (ref 30.0–36.0)
MCV: 91.1 fL (ref 78.0–100.0)
METAMYELOCYTES PCT: 0 %
MYELOCYTES: 0 %
Monocytes Absolute: 1.2 10*3/uL — ABNORMAL HIGH (ref 0.1–1.0)
Monocytes Relative: 9 %
Neutro Abs: 10.7 10*3/uL — ABNORMAL HIGH (ref 1.7–7.7)
Neutrophils Relative %: 79 %
Other: 0 %
PROMYELOCYTES ABS: 0 %
Platelets: 216 10*3/uL (ref 150–400)
RBC: 4.38 MIL/uL (ref 4.22–5.81)
RDW: 13.7 % (ref 11.5–15.5)
WBC: 13.5 10*3/uL — ABNORMAL HIGH (ref 4.0–10.5)
nRBC: 0 /100 WBC

## 2015-12-06 LAB — COMPREHENSIVE METABOLIC PANEL
ALT: 24 U/L (ref 17–63)
AST: 23 U/L (ref 15–41)
Albumin: 3.6 g/dL (ref 3.5–5.0)
Alkaline Phosphatase: 53 U/L (ref 38–126)
Anion gap: 10 (ref 5–15)
BILIRUBIN TOTAL: 0.7 mg/dL (ref 0.3–1.2)
BUN: 20 mg/dL (ref 6–20)
CO2: 22 mmol/L (ref 22–32)
CREATININE: 1.21 mg/dL (ref 0.61–1.24)
Calcium: 10 mg/dL (ref 8.9–10.3)
Chloride: 104 mmol/L (ref 101–111)
GFR calc Af Amer: 60 mL/min — ABNORMAL LOW (ref >60)
GFR, EST NON AFRICAN AMERICAN: 52 mL/min — AB (ref >60)
Glucose, Bld: 135 mg/dL — ABNORMAL HIGH (ref 65–99)
POTASSIUM: 3.6 mmol/L (ref 3.5–5.1)
Sodium: 136 mmol/L (ref 135–145)
TOTAL PROTEIN: 7.2 g/dL (ref 6.5–8.1)

## 2015-12-06 LAB — PROTIME-INR
INR: 1.92
Prothrombin Time: 22.2 seconds — ABNORMAL HIGH (ref 11.4–15.2)

## 2015-12-06 LAB — DIGOXIN LEVEL

## 2015-12-06 LAB — URINE MICROSCOPIC-ADD ON

## 2015-12-06 LAB — TROPONIN I

## 2015-12-06 MED ORDER — INDOMETHACIN 25 MG PO CAPS: 25.0000 mg | ORAL_CAPSULE | Freq: Three times a day (TID) | ORAL | 0 refills | Status: DC | PRN

## 2015-12-06 MED ORDER — HYDROCODONE-ACETAMINOPHEN 5-325 MG PO TABS: 1.0000 | ORAL_TABLET | Freq: Four times a day (QID) | ORAL | 0 refills | Status: DC | PRN

## 2015-12-06 NOTE — Discharge Instructions (Signed)
Begin taking indomethacin as prescribed.  Hydrocodone as prescribed as needed for pain.  Resume taking your allopurinol as previously prescribed.  Return to the emergency department if you develop high fever, worsening pain, or other new and concerning symptoms.

## 2015-12-06 NOTE — ED Provider Notes (Signed)
[facility]EMERGENCY DEPT Provider Note   CSN: [id number] Arrival date & time: 12/06/15  0714     History   Chief Complaint Chief Complaint  Patient presents with  . Altered Mental Status  . Leg Pain    HPI [name] is a 82 y.o. male.  Patient is an 82 year old male with past medical history of atrial fibrillation on Coumadin, coronary artery disease CHF, and prior CVA. He was brought by EMS for evaluation of altered level of consciousness. History somewhat limited from the patient has C appears confused and somewhat disoriented. He complains of leg pain and difficulty urinating. Other than that, his history is unreliable and difficult to elicit.      Past Medical History:  Diagnosis Date  . Atrial fibrillation (HCC)    chronic  . CAD (coronary artery disease)    s/p MI, unsure when  . CHF (congestive heart failure) (HCC)   . Cognitive impairment    from stroke  . 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 DOE (dyspnea on exertion)   . Gout   . HTN (hypertension)   . Hyperlipidemia     Patient Active Problem List   Diagnosis Date Noted  . Medicare annual wellness visit, subsequent 10/31/2014  . Advanced care planning/counseling discussion 10/31/2014  . DNR (do not resuscitate) 10/31/2014  . Dyspnea on exertion 09/03/2013  . Encounter for therapeutic drug monitoring 05/04/2013  . Gout 09/01/2012  . Cognitive impairment   . Permanent atrial fibrillation (HCC) 06/08/2010  . Long term (current) use of anticoagulants 06/08/2010  . Right bundle branch block 06/08/2010  . Cerebrovascular accident, embolic (HCC) 06/08/2010  . Aortic stenosis, mild 06/08/2010  . Chronic diastolic congestive heart failure (HCC) 06/08/2010  . Hypercholesterolemia 06/08/2010  . BPH (benign prostatic hyperplasia) 06/08/2010  . Essential hypertension 06/08/2010    Past Surgical History:  Procedure Laterality Date  .  APPENDECTOMY  1994  . CARDIOVASCULAR STRESS TEST  01/11/2008   evidence of an old scar of the septum and a larger apical scar but no reversible ischemia, EF 48%  . CATARACT EXTRACTION Left 2008   Epps  . EXCISION MORTON'S NEUROMA  1995   x 2  . HEMORRHOID SURGERY    . TONSILLECTOMY  1964  . US ECHOCARDIOGRAPHY  12/19/2007   EF 55-60%, mild aortic stenosis and mild pulmonary hypertension and mild mitral regurgitation       Home Medications    Prior to Admission medications   Medication Sig Start Date End Date Taking? Authorizing Provider  allopurinol (ZYLOPRIM) 100 MG tablet TAKE ONE TABLET BY MOUTH ONCE DAILY 01/17/15   Cassell Clement, MD  aspirin 81 MG tablet Take 81 mg by mouth daily.      Historical Provider, MD  digoxin (LANOXIN) 0.125 MG tablet TAKE ONE TABLET BY MOUTH EVERY OTHER DAY 10/23/14   Cassell Clement, MD  finasteride (PROSCAR) 5 MG tablet Take 5 mg by mouth daily.      Historical Provider, MD  furosemide (LASIX) 40 MG tablet Take 80 mg by mouth daily.    Historical Provider, MD  furosemide (LASIX) 40 MG tablet TAKE TWO TABLETS BY MOUTH IN THE MORNING 05/19/15   Vesta Mixer, MD  lisinopril (PRINIVIL,ZESTRIL) 20 MG tablet TAKE ONE TABLET BY MOUTH IN THE EVENING 12/23/14   Cassell Clement, MD  lisinopril-hydrochlorothiazide (PRINZIDE,ZESTORETIC) 20-12.5 MG tablet TAKE ONE TABLET BY MOUTH ONCE DAILY IN THE MORNING 03/17/15   Cassell Clement,  MD  metoprolol succinate (TOPROL-XL) 25 MG 24 hr tablet TAKE ONE-HALF TABLET BY MOUTH ONCE DAILY 12/23/14   Cassell Clementhomas Brackbill, MD  Multiple Vitamins-Minerals (PRESERVISION AREDS 2) CAPS Take 1 capsule by mouth daily. Without beta carotene    Historical Provider, MD  nitroGLYCERIN (NITROSTAT) 0.4 MG SL tablet Place 0.4 mg under the tongue every 5 (five) minutes as needed (for chest pain).     Historical Provider, MD  Omega-3 Fatty Acids (FISH OIL) 1000 MG CAPS Take 1,000 mg by mouth daily.     Historical Provider, MD  oxybutynin  (DITROPAN-XL) 5 MG 24 hr tablet Take 5 mg by mouth daily.    Historical Provider, MD  pravastatin (PRAVACHOL) 40 MG tablet TAKE ONE-HALF TABLET BY MOUTH ONCE DAILY 11/05/15   Vesta MixerPhilip J Nahser, MD  psyllium (METAMUCIL) 58.6 % packet Take 1 packet by mouth daily as needed (constipation).     Historical Provider, MD  warfarin (COUMADIN) 5 MG tablet Take 1 tablet (5 mg total) by mouth as directed. Take By Mouth As per Coumadin Clinic 08/04/15   Vesta MixerPhilip J Nahser, MD    Family History Family History  Problem Relation Age of Onset  . Cancer Brother     brain  . Cancer Sister     colon  . CAD Sister   . Alcohol abuse Brother     Social History Social History  Substance Use Topics  . Smoking status: Former Smoker    Quit date: 02/08/1950  . Smokeless tobacco: Never Used  . Alcohol use No     Allergies   Benazepril hcl   Review of Systems Review of Systems  Unable to perform ROS: Dementia     Physical Exam Updated Vital Signs Pulse 94   Temp 98.3 F (36.8 C) (Oral)   Resp 17   Ht 6' (1.829 m)   Wt 227 lb (103 kg)   SpO2 100%   BMI 30.79 kg/m   Physical Exam  Constitutional: He is oriented to person, place, and time. He appears well-developed and well-nourished. No distress.  HENT:  Head: Normocephalic and atraumatic.  Mouth/Throat: Oropharynx is clear and moist.  Neck: Normal range of motion. Neck supple.  Cardiovascular: Normal rate and regular rhythm.  Exam reveals no friction rub.   No murmur heard. Pulmonary/Chest: Effort normal and breath sounds normal. No respiratory distress. He has no wheezes. He has no rales.  Abdominal: Soft. Bowel sounds are normal. He exhibits no distension. There is no tenderness.  Musculoskeletal: Normal range of motion. He exhibits no edema.  Neurological: He is alert and oriented to person, place, and time. Coordination normal.  Skin: Skin is warm and dry. He is not diaphoretic.  Nursing note and vitals reviewed.    ED Treatments /  Results  Labs (all labs ordered are listed, but only abnormal results are displayed) Labs Reviewed  COMPREHENSIVE METABOLIC PANEL  TROPONIN I  CBC WITH DIFFERENTIAL/PLATELET  URINALYSIS, ROUTINE W REFLEX MICROSCOPIC (NOT [facility])  PROTIME-INR  DIGOXIN LEVEL    EKG  EKG Interpretation None       Radiology No results found.  Procedures Procedures (including critical care time)  Medications Ordered in ED Medications - No data to display   Initial Impression / Assessment and Plan / ED Course  I have reviewed the triage vital signs and the nursing notes.  Pertinent labs & imaging results that were available during my care of the patient were reviewed by me and considered in my medical decision  making (see chart for details).  Clinical Course    Patient was brought for evaluation of possible mental status change. The patient has a history of dementia and is somewhat difficult to obtain a history from. His wife's main concern is that he is complaining of right arm pain. He has a history of gout however has not taken his allopurinol for at least one week. Prior to the family's arrival, the information I was given was the patient was more confused. A workup was ordered to look into this, however everything returned essentially unremarkable. He does have pain with range of motion of the wrist. There is no significant warmth or erythema overlying the joint. I suspect this is likely a flareup of gout related to him not taking his medications. I see nothing else that warrants hospital admission or further evaluation/intervention. He will be discharged with indomethacin, pain medication, and will be advised to follow-up with his primary doctor for any problems.  Final Clinical Impressions(s) / ED Diagnoses   Final diagnoses:  None    New Prescriptions New Prescriptions   No medications on file     Geoffery Lyonsouglas Demetry Bendickson, MD 12/06/15 1046

## 2015-12-06 NOTE — ED Notes (Signed)
Pt is back from x-ray

## 2015-12-06 NOTE — ED Triage Notes (Signed)
Pt from home via [facility] for Altered Mental Status (not alert to time), R arm pain, R leg pain and acute onset incontinence and pain when urinating. EKG showed R BBB.

## 2015-12-06 NOTE — ED Notes (Signed)
Family at bedside. 

## 2015-12-06 NOTE — ED Notes (Signed)
Per daughter pt has hx of MI and Stroke. Pt has failed to take his gout medication for over a month. Daughter and wife state that pt is incontinent now because he was having so much pain he couldn't make it to the bathroom. Episode started last night around 10 pm when pt couldn't get up from his desk at home to make it to the bathroom. Pt was up all last night with pain in right arm and leg. Pain is mostly in right arm from elbow down per pt.

## 2015-12-06 NOTE — ED Notes (Signed)
Patient transported to X-ray 

## 2015-12-17 DIAGNOSIS — N401 Enlarged prostate with lower urinary tract symptoms: Secondary | ICD-10-CM | POA: Diagnosis not present

## 2015-12-17 DIAGNOSIS — N3941 Urge incontinence: Secondary | ICD-10-CM | POA: Diagnosis not present

## 2015-12-17 DIAGNOSIS — R3914 Feeling of incomplete bladder emptying: Secondary | ICD-10-CM | POA: Diagnosis not present

## 2015-12-22 ENCOUNTER — Encounter: Payer: Self-pay | Admitting: Cardiovascular Disease

## 2015-12-22 ENCOUNTER — Ambulatory Visit (INDEPENDENT_AMBULATORY_CARE_PROVIDER_SITE_OTHER): Payer: Medicare Other | Admitting: *Deleted

## 2015-12-22 ENCOUNTER — Ambulatory Visit (INDEPENDENT_AMBULATORY_CARE_PROVIDER_SITE_OTHER): Payer: Medicare Other | Admitting: Cardiovascular Disease

## 2015-12-22 VITALS — BP 126/66 | HR 69 | Ht 72.0 in | Wt 224.8 lb

## 2015-12-22 DIAGNOSIS — I10 Essential (primary) hypertension: Secondary | ICD-10-CM | POA: Diagnosis not present

## 2015-12-22 DIAGNOSIS — I482 Chronic atrial fibrillation, unspecified: Secondary | ICD-10-CM

## 2015-12-22 DIAGNOSIS — I350 Nonrheumatic aortic (valve) stenosis: Secondary | ICD-10-CM | POA: Diagnosis not present

## 2015-12-22 DIAGNOSIS — I4891 Unspecified atrial fibrillation: Secondary | ICD-10-CM

## 2015-12-22 DIAGNOSIS — Z7901 Long term (current) use of anticoagulants: Secondary | ICD-10-CM | POA: Diagnosis not present

## 2015-12-22 DIAGNOSIS — I5032 Chronic diastolic (congestive) heart failure: Secondary | ICD-10-CM

## 2015-12-22 DIAGNOSIS — I4821 Permanent atrial fibrillation: Secondary | ICD-10-CM

## 2015-12-22 DIAGNOSIS — Z5181 Encounter for therapeutic drug level monitoring: Secondary | ICD-10-CM

## 2015-12-22 LAB — POCT INR: INR: 3.1

## 2015-12-22 MED ORDER — FUROSEMIDE 40 MG PO TABS: ORAL_TABLET | ORAL | 3 refills | Status: DC

## 2015-12-22 MED ORDER — METOPROLOL SUCCINATE ER 25 MG PO TB24: ORAL_TABLET | ORAL | 3 refills | Status: DC

## 2015-12-22 MED ORDER — LISINOPRIL 20 MG PO TABS: 20.0000 mg | ORAL_TABLET | Freq: Every evening | ORAL | 3 refills | Status: DC

## 2015-12-22 MED ORDER — WARFARIN SODIUM 5 MG PO TABS
5.0000 mg | ORAL_TABLET | ORAL | 1 refills | Status: DC
Start: 2015-12-22 — End: 2016-08-22

## 2015-12-22 MED ORDER — DIGOXIN 125 MCG PO TABS: 125.0000 ug | ORAL_TABLET | ORAL | 3 refills | Status: DC

## 2015-12-22 MED ORDER — PRAVASTATIN SODIUM 40 MG PO TABS: 20.0000 mg | ORAL_TABLET | Freq: Every day | ORAL | 3 refills | Status: DC

## 2015-12-22 NOTE — Progress Notes (Signed)
Cardiology Office Note   Date:  12/22/2015   ID:  [name], DOB 1925-03-08, MRN [id number]  PCP:  Eustaquio BoydenJavier Gutierrez, MD  Cardiologist: Cassell Clementhomas Brackbill MD  Chief Complaint  Patient presents with  . Follow-up    atrial fib   Problem List 1. Chronic atrial fibrillation. 2. remote embolic stroke with expressive aphasia 3. history of aortic stenosis 4. hypertensive heart disease without heart failure 5. Hypercholesterolemia  6. chronic diastolic heart failure    Previous notes from Dr. Harlow AsaBrackbill  [name] is a 82 y.o. male  This pleasant 82 year old gentleman is seen for a four-month followup office visit. He has a history of established chronic atrial fibrillation. 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 He also has a history of chronic diastolic congestive heart failure. He has not had a cardiac catheterization. He does not have any history of ischemic heart disease. However an adenosine Cardiolite stress test in December 2009 showed evidence of an old scar of the septum and a larger apical scar but no reversible ischemia. The patient had an echocardiogram in November 2009 showing an ejection fraction 55-60% with mild aortic stenosis and mild pulmonary hypertension and mild mitral regurgitation. The patient is on long-term Coumadin for his chronic atrial fibrillation.  The patient continues to have exertional dyspnea which is stable. He has not been having any peripheral edema. He is having more of a problem with bladder urgency. He is no longer able to attend church.  Nov. 13, 2017:    Initial visit with [name]  - [place] [facility] [place]  Seen with his wife, [name].  Has a hx of chronic A-Fib , mild pulmonary HTN No CP or dyspnea. Has gout issues Has some swelling in his legs - R>L    Past Medical History:  Diagnosis Date  . Atrial fibrillation (HCC)    chronic  . CAD (coronary artery disease)    s/p  MI, unsure when  . CHF (congestive heart failure) (HCC)   . Cognitive impairment    from stroke  . 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 DOE (dyspnea on exertion)   . Gout   . HTN (hypertension)   . Hyperlipidemia     Past Surgical History:  Procedure Laterality Date  . APPENDECTOMY  1994  . CARDIOVASCULAR STRESS TEST  01/11/2008   evidence of an old scar of the septum and a larger apical scar but no reversible ischemia, EF 48%  . CATARACT EXTRACTION Left 2008   Epps  . EXCISION MORTON'S NEUROMA  1995   x 2  . HEMORRHOID SURGERY    . TONSILLECTOMY  1964  . US ECHOCARDIOGRAPHY  12/19/2007   EF 55-60%, mild aortic stenosis and mild pulmonary hypertension and mild mitral regurgitation     Current Outpatient Prescriptions  Medication Sig Dispense Refill  . allopurinol (ZYLOPRIM) 100 MG tablet TAKE ONE TABLET BY MOUTH ONCE DAILY (Patient taking differently: Take 100 mg by mouth once daily as needed for gout) 90 tablet 1  . aspirin 81 MG tablet Take 81 mg by mouth daily.      . digoxin (LANOXIN) 0.125 MG tablet TAKE ONE TABLET BY MOUTH EVERY OTHER DAY (Patient taking differently: Take 0.125 mg by mouth every other day) 45 tablet 3  . finasteride (PROSCAR) 5 MG tablet Take 5 mg by mouth daily.      . furosemide (LASIX) 40  MG tablet TAKE TWO TABLETS BY MOUTH IN THE MORNING (Patient taking differently: Take 80 mg by mouth in the morning) 180 tablet 1  . HYDROcodone-acetaminophen (NORCO) 5-325 MG tablet Take 1-2 tablets by mouth every 6 (six) hours as needed. 12 tablet 0  . indomethacin (INDOCIN) 25 MG capsule Take 1 capsule (25 mg total) by mouth 3 (three) times daily as needed. 21 capsule 0  . lisinopril (PRINIVIL,ZESTRIL) 20 MG tablet TAKE ONE TABLET BY MOUTH IN THE EVENING (Patient taking differently: Take 20 mg by mouth in the evening) 90 tablet 3  . lisinopril-hydrochlorothiazide (PRINZIDE,ZESTORETIC) 20-12.5 MG tablet TAKE  ONE TABLET BY MOUTH ONCE DAILY IN THE MORNING 90 tablet 2  . metoprolol succinate (TOPROL-XL) 25 MG 24 hr tablet TAKE ONE-HALF TABLET BY MOUTH ONCE DAILY (Patient taking differently: Take 12.5 mg by mouth once daily) 45 tablet 3  . nitroGLYCERIN (NITROSTAT) 0.4 MG SL tablet Place 0.4 mg under the tongue every 5 (five) minutes as needed (for chest pain).     . Omega-3 Fatty Acids (FISH OIL) 1000 MG CAPS Take 1,000 mg by mouth daily.     . pravastatin (PRAVACHOL) 40 MG tablet TAKE ONE-HALF TABLET BY MOUTH ONCE DAILY (Patient taking differently: Take 20 mg by mouth once daily) 45 tablet 0  . warfarin (COUMADIN) 5 MG tablet Take 1 tablet (5 mg total) by mouth as directed. Take By Mouth As per [facility] (Patient taking differently: Take 7.5-10 mg by mouth See admin instructions. Take 7.5 mg by mouth on Sunday, Monday, Tuesday, Thursday and Saturday. Take 10 mg by mouth on Wednesday and Friday.) 150 tablet 1   No current facility-administered medications for this visit.     Allergies:   Benazepril hcl    Social History:  The patient  reports that he quit smoking about 65 years ago. He has never used smokeless tobacco. He reports that he does not drink alcohol or use drugs.   Family History:  The patient's family history includes Alcohol abuse in his brother; CAD in his sister; Cancer in his brother and sister.    ROS:  Please see the history of present illness.   Otherwise, review of systems are positive for none.   All other systems are reviewed and negative.    PHYSICAL EXAM: VS:  BP 126/66   Pulse 69   Ht 6' (1.829 m)   Wt 224 lb 12.8 oz (102 kg)   SpO2 99%   BMI 30.49 kg/m  , BMI Body mass index is 30.49 kg/m. GEN: Well nourished, well developed, in no acute distress  HEENT: normal  Neck: no JVD, carotid bruits, or masses Cardiac: Irregularly irregular.  Soft apical systolic murmur at apex.  There are no rubs, or gallops, there is mild peripheral edema R leg >> left leg.  Marland Kitchen. Respiratory:  Few wheezes.  There are a few fine inspiratory rales at the right base. GI: soft, nontender, nondistended, + BS MS: no deformity or atrophy  Skin: warm and dry, no rash Neuro:  Strength and sensation are intact.the patient has partial expressive aphasia and difficulty finding words.   Psych: euthymic mood, full affect   EKG:  EKG is ordered today. The ekg ordered today demonstrates atrial fibrillation with ventricluar rate of 69.   Occasional PVCs vs. Aberrantly conducted beats.    Recent Labs: 12/06/2015: ALT 24; BUN 20; Creatinine, Ser 1.21; Hemoglobin 13.5; Platelets 216; Potassium 3.6; Sodium 136    Lipid Panel    Component Value  Date/Time   CHOL 125 12/20/2014 1020   TRIG 98 12/20/2014 1020   HDL 42 12/20/2014 1020   CHOLHDL 3.0 12/20/2014 1020   VLDL 20 12/20/2014 1020   LDLCALC 63 12/20/2014 1020      Wt Readings from Last 3 Encounters:  12/22/15 224 lb 12.8 oz (102 kg)  12/06/15 227 lb (103 kg)  12/20/14 227 lb 1.9 oz (103 kg)        ASSESSMENT AND PLAN:  1. Chronic atrial fibrillation:  HR is well controlled.   2. remote embolic stroke with expressive aphasia  3. history of aortic stenosis  4. hypertensive heart disease without heart failure  5. Hypercholesterolemia:  Currently on pravastatin, will check labs today   6. chronic diastolic heart failure  has has leg edema - I Suspect that this will improve significantly if he elevates his legs on a daily basis. I've also advised him to get compression hose.  Current medicines are reviewed at length with the patient today.  The patient does not have concerns regarding medicines.  The following changes have been made:  no change  Labs/ tests ordered today include:   No orders of the defined types were placed in this encounter.    Kristeen Miss, MD  12/22/2015 2:58 PM    [facility] [address] [place],  [address] [place], [place]  [address] Pager [phone] Phone: [phone]; Fax: [phone]

## 2015-12-22 NOTE — Patient Instructions (Signed)
Your physician recommends that you continue on your current medications as directed. Please refer to the Current Medication list given to you today. Your physician recommends that you return for lab work today (BMET, LIPIDS, LIVER)  Your physician wants you to follow-up in: 6 MONTHS WITH DR. Elease HashimotoNAHSER.  You will receive a reminder letter in the mail two months in advance. If you don't receive a letter, please call our office to schedule the follow-up appointment.

## 2015-12-22 NOTE — Addendum Note (Signed)
Addended by: Raul DelMUSE, Gregori Abril J on: 12/22/2015 03:01 PM   Modules accepted: Orders

## 2015-12-23 LAB — HEPATIC FUNCTION PANEL
ALT: 30 U/L (ref 9–46)
AST: 20 U/L (ref 10–35)
Albumin: 3.7 g/dL (ref 3.6–5.1)
Alkaline Phosphatase: 64 U/L (ref 40–115)
BILIRUBIN DIRECT: 0.2 mg/dL (ref <=0.2)
BILIRUBIN INDIRECT: 0.4 mg/dL (ref 0.2–1.2)
BILIRUBIN TOTAL: 0.6 mg/dL (ref 0.2–1.2)
Total Protein: 6.6 g/dL (ref 6.1–8.1)

## 2015-12-23 LAB — BASIC METABOLIC PANEL
BUN: 21 mg/dL (ref 7–25)
CALCIUM: 10.1 mg/dL (ref 8.6–10.3)
CO2: 25 mmol/L (ref 20–31)
Chloride: 99 mmol/L (ref 98–110)
Creat: 1.14 mg/dL — ABNORMAL HIGH (ref 0.70–1.11)
GLUCOSE: 88 mg/dL (ref 65–99)
Potassium: 3.9 mmol/L (ref 3.5–5.3)
Sodium: 136 mmol/L (ref 135–146)

## 2015-12-23 LAB — LIPID PANEL
CHOLESTEROL: 129 mg/dL (ref <200)
HDL: 41 mg/dL (ref >40)
LDL Cholesterol: 69 mg/dL (ref <100)
Total CHOL/HDL Ratio: 3.1 Ratio (ref <5.0)
Triglycerides: 97 mg/dL (ref <150)
VLDL: 19 mg/dL (ref <30)

## 2015-12-31 DIAGNOSIS — N3941 Urge incontinence: Secondary | ICD-10-CM | POA: Diagnosis not present

## 2016-01-05 ENCOUNTER — Other Ambulatory Visit: Payer: Self-pay | Admitting: *Deleted

## 2016-01-05 ENCOUNTER — Ambulatory Visit (INDEPENDENT_AMBULATORY_CARE_PROVIDER_SITE_OTHER): Payer: Medicare Other | Admitting: *Deleted

## 2016-01-05 DIAGNOSIS — Z7901 Long term (current) use of anticoagulants: Secondary | ICD-10-CM | POA: Diagnosis not present

## 2016-01-05 DIAGNOSIS — I482 Chronic atrial fibrillation: Secondary | ICD-10-CM

## 2016-01-05 DIAGNOSIS — I4821 Permanent atrial fibrillation: Secondary | ICD-10-CM

## 2016-01-05 DIAGNOSIS — I4891 Unspecified atrial fibrillation: Secondary | ICD-10-CM

## 2016-01-05 DIAGNOSIS — Z5181 Encounter for therapeutic drug level monitoring: Secondary | ICD-10-CM

## 2016-01-05 LAB — POCT INR: INR: 2.8

## 2016-01-05 MED ORDER — LISINOPRIL-HYDROCHLOROTHIAZIDE 20-12.5 MG PO TABS: ORAL_TABLET | ORAL | 3 refills | Status: DC

## 2016-01-21 ENCOUNTER — Ambulatory Visit (INDEPENDENT_AMBULATORY_CARE_PROVIDER_SITE_OTHER): Payer: Medicare Other | Admitting: *Deleted

## 2016-01-21 DIAGNOSIS — I4821 Permanent atrial fibrillation: Secondary | ICD-10-CM

## 2016-01-21 DIAGNOSIS — I482 Chronic atrial fibrillation: Secondary | ICD-10-CM

## 2016-01-21 DIAGNOSIS — Z5181 Encounter for therapeutic drug level monitoring: Secondary | ICD-10-CM

## 2016-01-21 DIAGNOSIS — I4891 Unspecified atrial fibrillation: Secondary | ICD-10-CM | POA: Diagnosis not present

## 2016-01-21 DIAGNOSIS — Z7901 Long term (current) use of anticoagulants: Secondary | ICD-10-CM

## 2016-01-21 LAB — POCT INR: INR: 3.1

## 2016-01-29 DIAGNOSIS — N3941 Urge incontinence: Secondary | ICD-10-CM | POA: Diagnosis not present

## 2016-02-11 ENCOUNTER — Ambulatory Visit (INDEPENDENT_AMBULATORY_CARE_PROVIDER_SITE_OTHER): Payer: Medicare Other | Admitting: *Deleted

## 2016-02-11 DIAGNOSIS — Z7901 Long term (current) use of anticoagulants: Secondary | ICD-10-CM

## 2016-02-11 DIAGNOSIS — Z5181 Encounter for therapeutic drug level monitoring: Secondary | ICD-10-CM

## 2016-02-11 DIAGNOSIS — I482 Chronic atrial fibrillation: Secondary | ICD-10-CM | POA: Diagnosis not present

## 2016-02-11 DIAGNOSIS — I4821 Permanent atrial fibrillation: Secondary | ICD-10-CM

## 2016-02-11 DIAGNOSIS — I4891 Unspecified atrial fibrillation: Secondary | ICD-10-CM | POA: Diagnosis not present

## 2016-02-11 LAB — POCT INR: INR: 2.5

## 2016-03-03 ENCOUNTER — Ambulatory Visit (INDEPENDENT_AMBULATORY_CARE_PROVIDER_SITE_OTHER): Payer: Medicare Other | Admitting: *Deleted

## 2016-03-03 DIAGNOSIS — I482 Chronic atrial fibrillation: Secondary | ICD-10-CM

## 2016-03-03 DIAGNOSIS — I4891 Unspecified atrial fibrillation: Secondary | ICD-10-CM | POA: Diagnosis not present

## 2016-03-03 DIAGNOSIS — Z5181 Encounter for therapeutic drug level monitoring: Secondary | ICD-10-CM

## 2016-03-03 DIAGNOSIS — Z7901 Long term (current) use of anticoagulants: Secondary | ICD-10-CM

## 2016-03-03 DIAGNOSIS — I4821 Permanent atrial fibrillation: Secondary | ICD-10-CM

## 2016-03-03 LAB — POCT INR: INR: 2.8

## 2016-03-08 ENCOUNTER — Other Ambulatory Visit: Payer: Self-pay | Admitting: *Deleted

## 2016-03-23 ENCOUNTER — Other Ambulatory Visit: Payer: Self-pay | Admitting: *Deleted

## 2016-03-23 MED ORDER — ALLOPURINOL 100 MG PO TABS: 100.0000 mg | ORAL_TABLET | Freq: Every day | ORAL | 0 refills | Status: DC

## 2016-03-25 ENCOUNTER — Ambulatory Visit (INDEPENDENT_AMBULATORY_CARE_PROVIDER_SITE_OTHER): Payer: Medicare Other | Admitting: Family Medicine

## 2016-03-25 ENCOUNTER — Encounter: Payer: Self-pay | Admitting: Family Medicine

## 2016-03-25 VITALS — BP 130/80 | HR 68 | Temp 97.8°F | Wt 216.5 lb

## 2016-03-25 DIAGNOSIS — B353 Tinea pedis: Secondary | ICD-10-CM

## 2016-03-25 DIAGNOSIS — M1A9XX0 Chronic gout, unspecified, without tophus (tophi): Secondary | ICD-10-CM

## 2016-03-25 DIAGNOSIS — I10 Essential (primary) hypertension: Secondary | ICD-10-CM

## 2016-03-25 DIAGNOSIS — L989 Disorder of the skin and subcutaneous tissue, unspecified: Secondary | ICD-10-CM | POA: Diagnosis not present

## 2016-03-25 DIAGNOSIS — I482 Chronic atrial fibrillation: Secondary | ICD-10-CM | POA: Diagnosis not present

## 2016-03-25 DIAGNOSIS — I4821 Permanent atrial fibrillation: Secondary | ICD-10-CM

## 2016-03-25 MED ORDER — ALLOPURINOL 100 MG PO TABS: 100.0000 mg | ORAL_TABLET | Freq: Every day | ORAL | 3 refills | Status: DC

## 2016-03-25 NOTE — Progress Notes (Signed)
Pre visit review using our clinic review tool, if applicable. No additional management support is needed unless otherwise documented below in the visit note. 

## 2016-03-25 NOTE — Patient Instructions (Addendum)
I will refill allopurinol 100mg  daily  Mail me updated copy of medicine list See Shirlee LimerickMarion on your way out to schedule dermatology appointment at your convenience.  Return sometime this year for medicare wellness visit

## 2016-03-25 NOTE — Progress Notes (Signed)
BP 130/80   Pulse 68   Temp 97.8 F (36.6 C) (Oral)   Wt 216 lb 8 oz (98.2 kg)   BMI 29.36 kg/m    CC: "he's got gout"  Subjective:    Patient ID: [name], male    DOB: [birth year], 83 y.o.   MRN: [id number]  HPI: [name] is a 83 y.o. male presenting on 03/25/2016 for Gout (right)   Last seen 10/2014. Prior saw Dr Patty Sermons, has now established with Dr Elease Hashimoto.   He stopped allopurinol for 2 weeks in October, then started having worsening foot pain, leg weakness and AMS. They called EMS and he was evaluated at ER. Overall unrevealing evaluation at ER. Attributed to gout. Allopurinol restarted and he has done well since then.   Requests allopurinol 100mg  refill.   Urinary incontinence - oxybutynin not effective. Started unknown new medication which is working Adult nurse - receiving medical assistance because it is very expensive.   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   Relevant past medical, surgical, family and social history reviewed and updated as indicated. Interim medical history since our last visit reviewed. Allergies and medications reviewed and updated. Current Outpatient Prescriptions on File Prior to Visit  Medication Sig  . aspirin 81 MG tablet Take 81 mg by mouth daily.    Marland Kitchen lisinopril (PRINIVIL,ZESTRIL) 20 MG tablet Take 20 mg by mouth daily.  . Omega-3 Fatty Acids (FISH OIL) 1000 MG CAPS Take 1,000 mg by mouth daily.   Marland Kitchen warfarin (COUMADIN) 5 MG tablet Take 1 tablet (5 mg total) by mouth as directed. Take By Mouth As per [facility]  . digoxin (LANOXIN) 0.125 MG tablet Take 1 tablet (125 mcg total) by mouth every other day. (Patient not taking: Reported on 03/25/2016)  . finasteride (PROSCAR) 5 MG tablet Take 5 mg by mouth daily.    . furosemide (LASIX) 40 MG tablet Take 2 tablets (80 mg) by mouth in the morning (Patient not taking: Reported on 03/25/2016)  .  indomethacin (INDOCIN) 25 MG capsule Take 1 capsule (25 mg total) by mouth 3 (three) times daily as needed. (Patient not taking: Reported on 03/25/2016)  . metoprolol succinate (TOPROL-XL) 25 MG 24 hr tablet Take 0.5 tablet (12.5 mg) by mouth daily  . nitroGLYCERIN (NITROSTAT) 0.4 MG SL tablet Place 0.4 mg under the tongue every 5 (five) minutes as needed (for chest pain).   . pravastatin (PRAVACHOL) 40 MG tablet Take 0.5 tablet (20 mg) by mouth daily   No current facility-administered medications on file prior to visit.     Review of Systems Per HPI unless specifically indicated in ROS section     Objective:    BP 130/80   Pulse 68   Temp 97.8 F (36.6 C) (Oral)   Wt 216 lb 8 oz (98.2 kg)   BMI 29.36 kg/m   Wt Readings from Last 3 Encounters:  03/25/16 216 lb 8 oz (98.2 kg)  12/22/15 224 lb 12.8 oz (102 kg)  12/06/15 227 lb (103 kg)    Physical Exam  Constitutional: He appears well-developed and well-nourished. No distress.  Walks with walker  HENT:  Mouth/Throat: Oropharynx is clear and moist. No oropharyngeal exudate.  Eyes: Conjunctivae are normal. Pupils are equal, round, and reactive to light.  Cardiovascular: Normal rate, regular rhythm, normal heart sounds and intact distal pulses.   No murmur heard. Pulmonary/Chest: Effort normal and breath sounds normal.  No respiratory distress. He has no wheezes. He has no rales.  Musculoskeletal: He exhibits no edema.  2+ DP bilaterally No podagra, no swelling or warmth or pain of feet Skin maceration/breakdown between R 4th/5th toes  Neurological:  unsteady gait with walker  Skin: Skin is warm and dry. No rash noted.  Verrucous growth with central scab R cheek  Psychiatric: He has a normal mood and affect.  Nursing note and vitals reviewed.  Results for orders placed or performed in visit on 03/03/16  POCT INR  Result Value Ref Range   INR 2.8       Assessment & Plan:  Med list changes, wife unsure of details. I have  asked wife to mail us update med list which she will work on.   Problem List Items Addressed This Visit    Essential hypertension    Chronic, stable. Continue current regimen.       Gout - Primary    Chronic, stable as long as he takes his allopurinol 100mg  daily. Will refill today.       Lesion of skin of cheek    Present since 2014. Will refer to derm to r/o BCC.      Relevant Orders   Ambulatory referral to Dermatology   Permanent atrial fibrillation [facility](HCC)    Appreciate cards care. Continue coumadin managed through coumadin clinic.       Tinea pedis    Interdigital tinea pedis - rec OTC lotrimin cream, discussed foot care.           Follow up plan: Return in about 6 months (around 09/22/2016) for medicare wellness visit.  Eustaquio BoydenJavier Gustave Lindeman, MD

## 2016-03-26 DIAGNOSIS — B353 Tinea pedis: Secondary | ICD-10-CM | POA: Insufficient documentation

## 2016-03-26 NOTE — Assessment & Plan Note (Signed)
Chronic, stable as long as he takes his allopurinol 100mg  daily. Will refill today.

## 2016-03-26 NOTE — Assessment & Plan Note (Signed)
Present since 2014. Will refer to derm to r/o BCC.

## 2016-03-26 NOTE — Assessment & Plan Note (Signed)
Chronic, stable. Continue current regimen. 

## 2016-03-26 NOTE — Assessment & Plan Note (Signed)
Appreciate cards care. Continue coumadin managed through coumadin clinic.

## 2016-03-26 NOTE — Assessment & Plan Note (Signed)
Interdigital tinea pedis - rec OTC lotrimin cream, discussed foot care.

## 2016-03-31 ENCOUNTER — Ambulatory Visit (INDEPENDENT_AMBULATORY_CARE_PROVIDER_SITE_OTHER): Payer: Medicare Other | Admitting: *Deleted

## 2016-03-31 DIAGNOSIS — I482 Chronic atrial fibrillation: Secondary | ICD-10-CM | POA: Diagnosis not present

## 2016-03-31 DIAGNOSIS — Z5181 Encounter for therapeutic drug level monitoring: Secondary | ICD-10-CM

## 2016-03-31 DIAGNOSIS — Z7901 Long term (current) use of anticoagulants: Secondary | ICD-10-CM | POA: Diagnosis not present

## 2016-03-31 DIAGNOSIS — I4891 Unspecified atrial fibrillation: Secondary | ICD-10-CM

## 2016-03-31 DIAGNOSIS — I4821 Permanent atrial fibrillation: Secondary | ICD-10-CM

## 2016-03-31 LAB — POCT INR: INR: 3.1

## 2016-04-28 ENCOUNTER — Ambulatory Visit (INDEPENDENT_AMBULATORY_CARE_PROVIDER_SITE_OTHER): Payer: Medicare Other | Admitting: *Deleted

## 2016-04-28 DIAGNOSIS — Z5181 Encounter for therapeutic drug level monitoring: Secondary | ICD-10-CM | POA: Diagnosis not present

## 2016-04-28 DIAGNOSIS — I482 Chronic atrial fibrillation: Secondary | ICD-10-CM | POA: Diagnosis not present

## 2016-04-28 DIAGNOSIS — I4821 Permanent atrial fibrillation: Secondary | ICD-10-CM

## 2016-04-28 DIAGNOSIS — I4891 Unspecified atrial fibrillation: Secondary | ICD-10-CM

## 2016-04-28 DIAGNOSIS — Z7901 Long term (current) use of anticoagulants: Secondary | ICD-10-CM

## 2016-04-28 LAB — POCT INR: INR: 3.3

## 2016-05-19 ENCOUNTER — Ambulatory Visit (INDEPENDENT_AMBULATORY_CARE_PROVIDER_SITE_OTHER): Payer: Medicare Other | Admitting: *Deleted

## 2016-05-19 DIAGNOSIS — Z5181 Encounter for therapeutic drug level monitoring: Secondary | ICD-10-CM | POA: Diagnosis not present

## 2016-05-19 DIAGNOSIS — I4891 Unspecified atrial fibrillation: Secondary | ICD-10-CM

## 2016-05-19 DIAGNOSIS — I4821 Permanent atrial fibrillation: Secondary | ICD-10-CM

## 2016-05-19 DIAGNOSIS — Z7901 Long term (current) use of anticoagulants: Secondary | ICD-10-CM | POA: Diagnosis not present

## 2016-05-19 DIAGNOSIS — I482 Chronic atrial fibrillation: Secondary | ICD-10-CM

## 2016-05-19 LAB — POCT INR: INR: 2.9

## 2016-06-09 ENCOUNTER — Ambulatory Visit (INDEPENDENT_AMBULATORY_CARE_PROVIDER_SITE_OTHER): Payer: Medicare Other | Admitting: *Deleted

## 2016-06-09 DIAGNOSIS — I482 Chronic atrial fibrillation: Secondary | ICD-10-CM | POA: Diagnosis not present

## 2016-06-09 DIAGNOSIS — I4821 Permanent atrial fibrillation: Secondary | ICD-10-CM

## 2016-06-09 DIAGNOSIS — I4891 Unspecified atrial fibrillation: Secondary | ICD-10-CM

## 2016-06-09 DIAGNOSIS — Z5181 Encounter for therapeutic drug level monitoring: Secondary | ICD-10-CM

## 2016-06-09 DIAGNOSIS — Z7901 Long term (current) use of anticoagulants: Secondary | ICD-10-CM

## 2016-06-09 LAB — POCT INR: INR: 3

## 2016-06-18 DIAGNOSIS — Z9842 Cataract extraction status, left eye: Secondary | ICD-10-CM | POA: Diagnosis not present

## 2016-06-18 DIAGNOSIS — H353132 Nonexudative age-related macular degeneration, bilateral, intermediate dry stage: Secondary | ICD-10-CM | POA: Diagnosis not present

## 2016-06-18 DIAGNOSIS — Z9841 Cataract extraction status, right eye: Secondary | ICD-10-CM | POA: Diagnosis not present

## 2016-06-18 DIAGNOSIS — H40021 Open angle with borderline findings, high risk, right eye: Secondary | ICD-10-CM | POA: Diagnosis not present

## 2016-06-28 ENCOUNTER — Ambulatory Visit (INDEPENDENT_AMBULATORY_CARE_PROVIDER_SITE_OTHER): Payer: Medicare Other | Admitting: *Deleted

## 2016-06-28 ENCOUNTER — Ambulatory Visit (INDEPENDENT_AMBULATORY_CARE_PROVIDER_SITE_OTHER): Payer: Medicare Other | Admitting: Cardiovascular Disease

## 2016-06-28 ENCOUNTER — Encounter: Payer: Self-pay | Admitting: Cardiovascular Disease

## 2016-06-28 ENCOUNTER — Encounter (INDEPENDENT_AMBULATORY_CARE_PROVIDER_SITE_OTHER): Payer: Self-pay

## 2016-06-28 VITALS — BP 142/80 | HR 52 | Ht 72.0 in | Wt 216.0 lb

## 2016-06-28 DIAGNOSIS — I4891 Unspecified atrial fibrillation: Secondary | ICD-10-CM

## 2016-06-28 DIAGNOSIS — I482 Chronic atrial fibrillation, unspecified: Secondary | ICD-10-CM

## 2016-06-28 DIAGNOSIS — I4821 Permanent atrial fibrillation: Secondary | ICD-10-CM

## 2016-06-28 DIAGNOSIS — Z7901 Long term (current) use of anticoagulants: Secondary | ICD-10-CM | POA: Diagnosis not present

## 2016-06-28 DIAGNOSIS — Z5181 Encounter for therapeutic drug level monitoring: Secondary | ICD-10-CM

## 2016-06-28 LAB — POCT INR: INR: 3.1

## 2016-06-28 NOTE — Patient Instructions (Signed)
Medication Instructions:  Your physician recommends that you continue on your current medications as directed. Please refer to the Current Medication list given to you today.   Labwork: None Ordered   Testing/Procedures: None Ordered   Follow-Up: Your physician wants you to follow-up in: 6 months with Dr. Nahser.  You will receive a reminder letter in the mail two months in advance. If you don't receive a letter, please call our office to schedule the follow-up appointment.   If you need a refill on your cardiac medications before your next appointment, please call your pharmacy.   Thank you for choosing [facility]! Kaylean Tupou, RN [phone]    

## 2016-06-28 NOTE — Progress Notes (Signed)
Cardiology Office Note   Date:  06/28/2016   ID:  [name], [name] [birth year], MRN [id number]  PCP:  Ivan Boyden, MD  Cardiologist: Ivan Clement MD - , now Nahser   Chief Complaint  Patient presents with  . Follow-up    Chronic diastolic CHF   Problem List 1. Chronic atrial fibrillation. 2. remote embolic stroke with expressive aphasia 3. history of aortic stenosis 4. hypertensive heart disease without heart failure 5. Hypercholesterolemia  6. chronic diastolic heart failure    Previous notes from Dr. Harlow Asa Phil Ramsey is a 83 y.o. male  This pleasant 83 year old gentleman is seen for a four-month followup office visit. He has a history of established chronic atrial fibrillation. 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 He also has a history of chronic diastolic congestive heart failure. He has not had a cardiac catheterization. He does not have any history of ischemic heart disease. However an adenosine Cardiolite stress test in December 2009 showed evidence of an old scar of the septum and a larger apical scar but no reversible ischemia. The patient had an echocardiogram in November 2009 showing an ejection fraction 55-60% with mild aortic stenosis and mild pulmonary hypertension and mild mitral regurgitation. The patient is on long-term Coumadin for his chronic atrial fibrillation.  The patient continues to have exertional dyspnea which is stable. He has not been having any peripheral edema. He is having more of a problem with bladder urgency. He is no longer able to attend church.  Nov. 13, 2017:    Initial visit with [name]  - transfer from [place].  Seen with his wife, [name].  Has a hx of chronic A-Fib , mild pulmonary HTN No CP or dyspnea. Has gout issues Has some swelling in his legs - R>L    Jun 28, 2016:  Doing well.   Having some issues with incontenence - is on Lasix. Had    incontinence today on his way into the office I suggested that he hold it when he has an appt. .  BP looks good Breathing is good   Past Medical History:  Diagnosis Date  . Atrial fibrillation (HCC)    chronic  . CAD (coronary artery disease)    s/p MI, unsure when  . CHF (congestive heart failure) (HCC)   . Cognitive impairment    from stroke  . 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 DOE (dyspnea on exertion)   . Gout   . HTN (hypertension)   . Hyperlipidemia     Past Surgical History:  Procedure Laterality Date  . APPENDECTOMY  1994  . CARDIOVASCULAR STRESS TEST  01/11/2008   evidence of an old scar of the septum and a larger apical scar but no reversible ischemia, EF 48%  . CATARACT EXTRACTION Left 2008   Epps  . EXCISION MORTON'S NEUROMA  1995   x 2  . HEMORRHOID SURGERY    . TONSILLECTOMY  1964  . US ECHOCARDIOGRAPHY  12/19/2007   EF 55-60%, mild aortic stenosis and mild pulmonary hypertension and mild mitral regurgitation     Current Outpatient Prescriptions  Medication Sig Dispense Refill  . allopurinol (ZYLOPRIM) 100 MG tablet Take 1 tablet (100 mg total) by mouth daily. 90 tablet 3  . aspirin 81 MG tablet Take 81 mg by mouth daily.      . finasteride (PROSCAR) 5 MG tablet Take  5 mg by mouth daily.      Marland Kitchen. lisinopril (PRINIVIL,ZESTRIL) 20 MG tablet Take 20 mg by mouth daily.    . metoprolol succinate (TOPROL-XL) 25 MG 24 hr tablet Take 0.5 tablet (12.5 mg) by mouth daily    . nitroGLYCERIN (NITROSTAT) 0.4 MG SL tablet Place 0.4 mg under the tongue every 5 (five) minutes as needed (for chest pain).     . Omega-3 Fatty Acids (FISH OIL) 1000 MG CAPS Take 1,000 mg by mouth daily.     . pravastatin (PRAVACHOL) 40 MG tablet Take 0.5 tablet (20 mg) by mouth daily    . warfarin (COUMADIN) 5 MG tablet Take 1 tablet (5 mg total) by mouth as directed. Take By Mouth As per Coumadin Clinic 150 tablet 1  . digoxin  (LANOXIN) 0.125 MG tablet Take 1 tablet (125 mcg total) by mouth every other day. (Patient not taking: Reported on 03/25/2016) 45 tablet 3  . furosemide (LASIX) 40 MG tablet Take 2 tablets (80 mg) by mouth in the morning (Patient not taking: Reported on 03/25/2016) 180 tablet 3  . indomethacin (INDOCIN) 25 MG capsule Take 1 capsule (25 mg total) by mouth 3 (three) times daily as needed. (Patient not taking: Reported on 03/25/2016) 21 capsule 0   No current facility-administered medications for this visit.     Allergies:   Benazepril hcl    Social History:  The patient  reports that he quit smoking about 66 years ago. He has never used smokeless tobacco. He reports that he does not drink alcohol or use drugs.   Family History:  The patient's family history includes Alcohol abuse in his brother; CAD in his sister; Cancer in his brother and sister.    ROS:  Please see the history of present illness.   Otherwise, review of systems are positive for none.   All other systems are reviewed and negative.    PHYSICAL EXAM: VS:  BP (!) 142/80 (BP Location: Right Arm, Patient Position: Sitting, Cuff Size: Large)   Pulse (!) 52   Ht 6' (1.829 m)   Wt 216 lb (98 kg)   SpO2 96%   BMI 29.29 kg/m  , BMI Body mass index is 29.29 kg/m. GEN: Well nourished, well developed, in no acute distress  HEENT: normal  Neck: no JVD, carotid bruits, or masses Cardiac: Irregularly irregular.  Soft apical systolic murmur at apex.  There are no rubs, or gallops, there is mild peripheral edema R leg >> left leg. Marland Kitchen. Respiratory:  Few wheezes.  There are a few fine inspiratory rales at the right base. GI: soft, nontender, nondistended, + BS MS: no deformity or atrophy  Skin: warm and dry, no rash Neuro:  Strength and sensation are intact.the patient has partial expressive aphasia and difficulty finding words.   Psych: euthymic mood, full affect   EKG:  EKG is ordered today. The ekg ordered today demonstrates atrial  fibrillation with ventricluar rate of 69.   Occasional PVCs vs. Aberrantly conducted beats.    Recent Labs: 12/06/2015: Hemoglobin 13.5; Platelets 216 12/22/2015: ALT 30; BUN 21; Creat 1.14; Potassium 3.9; Sodium 136    Lipid Panel    Component Value Date/Time   CHOL 129 12/22/2015 1500   TRIG 97 12/22/2015 1500   HDL 41 12/22/2015 1500   CHOLHDL 3.1 12/22/2015 1500   VLDL 19 12/22/2015 1500   LDLCALC 69 12/22/2015 1500      Wt Readings from Last 3 Encounters:  06/28/16 216 lb (98  kg)  03/25/16 216 lb 8 oz (98.2 kg)  12/22/15 224 lb 12.8 oz (102 kg)        ASSESSMENT AND PLAN:  1. Chronic atrial fibrillation:  HR is well controlled.   2. remote embolic stroke with expressive aphasia  3. history of aortic stenosis  4. hypertensive heart disease without heart failure  5. Hypercholesterolemia:  Currently on pravastatin, will check labs today   6. chronic diastolic heart failure   Current medicines are reviewed at length with the patient today.  The patient does not have concerns regarding medicines.  The following changes have been made:  no change  Labs/ tests ordered today include:   No orders of the defined types were placed in this encounter.    Kristeen Miss, MD  06/28/2016 3:28 PM    [facility] [address] [place],  [address] [place], [place]  [address] Pager [phone] Phone: [phone]; Fax: [phone]

## 2016-07-19 ENCOUNTER — Ambulatory Visit (INDEPENDENT_AMBULATORY_CARE_PROVIDER_SITE_OTHER): Payer: Medicare Other

## 2016-07-19 DIAGNOSIS — Z5181 Encounter for therapeutic drug level monitoring: Secondary | ICD-10-CM

## 2016-07-19 DIAGNOSIS — Z7901 Long term (current) use of anticoagulants: Secondary | ICD-10-CM

## 2016-07-19 DIAGNOSIS — I4891 Unspecified atrial fibrillation: Secondary | ICD-10-CM | POA: Diagnosis not present

## 2016-07-19 DIAGNOSIS — I482 Chronic atrial fibrillation: Secondary | ICD-10-CM

## 2016-07-19 DIAGNOSIS — I4821 Permanent atrial fibrillation: Secondary | ICD-10-CM

## 2016-07-19 LAB — POCT INR: INR: 3.5

## 2016-08-09 ENCOUNTER — Ambulatory Visit (INDEPENDENT_AMBULATORY_CARE_PROVIDER_SITE_OTHER): Payer: Medicare Other | Admitting: *Deleted

## 2016-08-09 DIAGNOSIS — I482 Chronic atrial fibrillation: Secondary | ICD-10-CM | POA: Diagnosis not present

## 2016-08-09 DIAGNOSIS — I4891 Unspecified atrial fibrillation: Secondary | ICD-10-CM | POA: Diagnosis not present

## 2016-08-09 DIAGNOSIS — I4821 Permanent atrial fibrillation: Secondary | ICD-10-CM

## 2016-08-09 DIAGNOSIS — Z5181 Encounter for therapeutic drug level monitoring: Secondary | ICD-10-CM

## 2016-08-09 DIAGNOSIS — Z7901 Long term (current) use of anticoagulants: Secondary | ICD-10-CM | POA: Diagnosis not present

## 2016-08-09 LAB — POCT INR: INR: 2.2

## 2016-08-15 ENCOUNTER — Emergency Department (HOSPITAL_COMMUNITY)
Admission: EM | Admit: 2016-08-15 | Discharge: 2016-08-15 | Disposition: A | Discharge status: Home / Self Care | Payer: Medicare Other | Attending: Emergency Medicine | Admitting: Emergency Medicine

## 2016-08-15 ENCOUNTER — Encounter (HOSPITAL_COMMUNITY): Payer: Self-pay

## 2016-08-15 DIAGNOSIS — I10 Essential (primary) hypertension: Secondary | ICD-10-CM | POA: Diagnosis not present

## 2016-08-15 DIAGNOSIS — I4891 Unspecified atrial fibrillation: Secondary | ICD-10-CM | POA: Insufficient documentation

## 2016-08-15 DIAGNOSIS — Z87891 Personal history of nicotine dependence: Secondary | ICD-10-CM | POA: Diagnosis not present

## 2016-08-15 DIAGNOSIS — R079 Chest pain, unspecified: Secondary | ICD-10-CM | POA: Diagnosis not present

## 2016-08-15 DIAGNOSIS — I2510 Atherosclerotic heart disease of native coronary artery without angina pectoris: Secondary | ICD-10-CM | POA: Insufficient documentation

## 2016-08-15 DIAGNOSIS — R339 Retention of urine, unspecified: Secondary | ICD-10-CM | POA: Diagnosis not present

## 2016-08-15 DIAGNOSIS — Z79899 Other long term (current) drug therapy: Secondary | ICD-10-CM | POA: Insufficient documentation

## 2016-08-15 DIAGNOSIS — Z8673 Personal history of transient ischemic attack (TIA), and cerebral infarction without residual deficits: Secondary | ICD-10-CM | POA: Insufficient documentation

## 2016-08-15 DIAGNOSIS — I509 Heart failure, unspecified: Secondary | ICD-10-CM | POA: Insufficient documentation

## 2016-08-15 DIAGNOSIS — R338 Other retention of urine: Secondary | ICD-10-CM | POA: Diagnosis not present

## 2016-08-15 DIAGNOSIS — Z7901 Long term (current) use of anticoagulants: Secondary | ICD-10-CM | POA: Diagnosis not present

## 2016-08-15 DIAGNOSIS — G4489 Other headache syndrome: Secondary | ICD-10-CM | POA: Diagnosis not present

## 2016-08-15 DIAGNOSIS — R319 Hematuria, unspecified: Secondary | ICD-10-CM | POA: Diagnosis not present

## 2016-08-15 DIAGNOSIS — R1030 Lower abdominal pain, unspecified: Secondary | ICD-10-CM | POA: Diagnosis present

## 2016-08-15 LAB — PROTIME-INR
INR: 2.29
Prothrombin Time: 25.6 seconds — ABNORMAL HIGH (ref 11.4–15.2)

## 2016-08-15 LAB — URINALYSIS, ROUTINE W REFLEX MICROSCOPIC
BACTERIA UA: NONE SEEN
Bilirubin Urine: NEGATIVE
Glucose, UA: NEGATIVE mg/dL
Ketones, ur: NEGATIVE mg/dL
Leukocytes, UA: NEGATIVE
Nitrite: NEGATIVE
PROTEIN: NEGATIVE mg/dL
SPECIFIC GRAVITY, URINE: 1.011 (ref 1.005–1.030)
SQUAMOUS EPITHELIAL / LPF: NONE SEEN
pH: 7 (ref 5.0–8.0)

## 2016-08-15 LAB — CBC WITH DIFFERENTIAL/PLATELET
BASOS ABS: 0 10*3/uL (ref 0.0–0.1)
Basophils Relative: 0 %
Eosinophils Absolute: 0 10*3/uL (ref 0.0–0.7)
Eosinophils Relative: 0 %
HCT: 41.1 % (ref 39.0–52.0)
HEMOGLOBIN: 13.4 g/dL (ref 13.0–17.0)
LYMPHS ABS: 0.5 10*3/uL — AB (ref 0.7–4.0)
LYMPHS PCT: 8 %
MCH: 30.4 pg (ref 26.0–34.0)
MCHC: 32.6 g/dL (ref 30.0–36.0)
MCV: 93.2 fL (ref 78.0–100.0)
Monocytes Absolute: 0.9 10*3/uL (ref 0.1–1.0)
Monocytes Relative: 13 %
NEUTROS PCT: 79 %
Neutro Abs: 5.2 10*3/uL (ref 1.7–7.7)
Platelets: 155 10*3/uL (ref 150–400)
RBC: 4.41 MIL/uL (ref 4.22–5.81)
RDW: 15 % (ref 11.5–15.5)
WBC: 6.7 10*3/uL (ref 4.0–10.5)

## 2016-08-15 LAB — I-STAT CHEM 8, ED
BUN: 26 mg/dL — AB (ref 6–20)
CALCIUM ION: 1.18 mmol/L (ref 1.15–1.40)
CHLORIDE: 104 mmol/L (ref 101–111)
Creatinine, Ser: 1.5 mg/dL — ABNORMAL HIGH (ref 0.61–1.24)
GLUCOSE: 93 mg/dL (ref 65–99)
HCT: 40 % (ref 39.0–52.0)
Hemoglobin: 13.6 g/dL (ref 13.0–17.0)
POTASSIUM: 4 mmol/L (ref 3.5–5.1)
SODIUM: 136 mmol/L (ref 135–145)
TCO2: 22 mmol/L (ref 0–100)

## 2016-08-15 NOTE — ED Triage Notes (Signed)
PER EMS: Pt woke up this morning with a headache associated with SOB, abd pain, fever and new onset of incontinence. PMHx of stroke, MI and afib with dysphasia which is currently at baseline. Pt told EMS he was having chest pain for three days but none right now. BP- 181/98, HR-84 irregular afib, 98% on RA, RR-30 tachypneic and CBG-144.

## 2016-08-15 NOTE — ED Provider Notes (Signed)
[facility]-EMERGENCY DEPT Provider Note   CSN: [id number] Arrival date & time: 08/15/16  1242     History   Chief Complaint Chief Complaint  Patient presents with  . Headache    HPI [name] is a 83 y.o. male.  HPI Patient presents for lower abdominal pain and urinary retention. Last night last night. No history of urinary retention. States he had some feeling of fullness with it. Slight shortness of breath and headache at times. Feels as if the abdomen is the real issue though. He is on anticoagulation. Foley placed upon arrival after palpation of large bladder up to umbilicus. Past Medical History:  Diagnosis Date  . Atrial fibrillation (HCC)    chronic  . CAD (coronary artery disease)    s/p MI, unsure when  . CHF (congestive heart failure) (HCC)   . Cognitive impairment    from stroke  . 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 DOE (dyspnea on exertion)   . Gout   . HTN (hypertension)   . Hyperlipidemia     Patient Active Problem List   Diagnosis Date Noted  . Tinea pedis 03/26/2016  . Medicare annual wellness visit, subsequent 10/31/2014  . Advanced care planning/counseling discussion 10/31/2014  . DNR (do not resuscitate) 10/31/2014  . Dyspnea on exertion 09/03/2013  . Encounter for therapeutic drug monitoring 05/04/2013  . Gout 09/01/2012  . Lesion of skin of cheek 06/23/2012  . Cognitive impairment   . Permanent atrial fibrillation (HCC) 06/08/2010  . Long term (current) use of anticoagulants 06/08/2010  . Right bundle branch block 06/08/2010  . Cerebrovascular accident, embolic (HCC) 06/08/2010  . Aortic stenosis, mild 06/08/2010  . Chronic diastolic congestive heart failure (HCC) 06/08/2010  . Hypercholesterolemia 06/08/2010  . BPH (benign prostatic hyperplasia) 06/08/2010  . Essential hypertension 06/08/2010    Past Surgical History:  Procedure Laterality Date  . APPENDECTOMY  1994  .  CARDIOVASCULAR STRESS TEST  01/11/2008   evidence of an old scar of the septum and a larger apical scar but no reversible ischemia, EF 48%  . CATARACT EXTRACTION Left 2008   Epps  . EXCISION MORTON'S NEUROMA  1995   x 2  . HEMORRHOID SURGERY    . TONSILLECTOMY  1964  . US ECHOCARDIOGRAPHY  12/19/2007   EF 55-60%, mild aortic stenosis and mild pulmonary hypertension and mild mitral regurgitation       Home Medications    Prior to Admission medications   Medication Sig Start Date End Date Taking? Authorizing Provider  allopurinol (ZYLOPRIM) 100 MG tablet Take 1 tablet (100 mg total) by mouth daily. 03/25/16   Eustaquio Boyden, MD  aspirin 81 MG tablet Take 81 mg by mouth daily.      [provider]  digoxin (LANOXIN) 0.125 MG tablet Take 1 tablet (125 mcg total) by mouth every other day. Patient not taking: Reported on 03/25/2016 12/22/15   Nahser, Deloris Ping, MD  finasteride (PROSCAR) 5 MG tablet Take 5 mg by mouth daily.      [provider]  furosemide (LASIX) 40 MG tablet Take 2 tablets (80 mg) by mouth in the morning Patient not taking: Reported on 03/25/2016 12/22/15   Nahser, Deloris Ping, MD  indomethacin (INDOCIN) 25 MG capsule Take 1 capsule (25 mg total) by mouth 3 (three) times daily as needed. Patient not taking: Reported on 03/25/2016 12/06/15   Geoffery Lyons, MD  lisinopril (PRINIVIL,ZESTRIL) 20 MG tablet Take 20  mg by mouth daily.    [provider]  metoprolol succinate (TOPROL-XL) 25 MG 24 hr tablet Take 0.5 tablet (12.5 mg) by mouth daily    [provider]  mirabegron ER (MYRBETRIQ) 50 MG TB24 tablet Take 50 mg by mouth daily.    [provider]  nitroGLYCERIN (NITROSTAT) 0.4 MG SL tablet Place 0.4 mg under the tongue every 5 (five) minutes as needed (for chest pain).     [provider]  Omega-3 Fatty Acids (FISH OIL) 1000 MG CAPS Take 1,000 mg by mouth daily.     [provider]  pravastatin (PRAVACHOL) 40 MG  tablet Take 0.5 tablet (20 mg) by mouth daily    [provider]  warfarin (COUMADIN) 5 MG tablet Take 1 tablet (5 mg total) by mouth as directed. Take By Mouth As per Coumadin Clinic 12/22/15   Nahser, Deloris Ping, MD    Family History Family History  Problem Relation Age of Onset  . Cancer Brother        brain  . Cancer Sister        colon  . CAD Sister   . Alcohol abuse Brother     Social History Social History  Substance Use Topics  . Smoking status: Former Smoker    Quit date: 02/08/1950  . Smokeless tobacco: Never Used  . Alcohol use No     Allergies   Benazepril hcl   Review of Systems Review of Systems  Constitutional: Negative for appetite change and fever.  HENT: Negative for congestion.   Respiratory: Positive for shortness of breath.   Cardiovascular: Positive for chest pain.  Gastrointestinal: Positive for abdominal pain.  Genitourinary: Positive for difficulty urinating. Negative for testicular pain.  Musculoskeletal: Negative for back pain.  Skin: Negative for rash.  Neurological: Positive for headaches.  Psychiatric/Behavioral: Negative for confusion.     Physical Exam Updated Vital Signs BP (!) 168/90   Pulse 70   Temp 98 F (36.7 C) (Oral)   Resp 18   SpO2 99%   Physical Exam  Constitutional: He appears well-developed.  HENT:  Head: Atraumatic.  Neck: Neck supple.  Cardiovascular: Normal rate.   Pulmonary/Chest: Effort normal.  Initially dyspneic but improved after Foley catheter  Abdominal: He exhibits mass. There is tenderness.  Musculoskeletal: He exhibits no edema.  Neurological: He is alert.  Skin: Skin is warm. Capillary refill takes less than 2 seconds.     ED Treatments / Results  Labs (all labs ordered are listed, but only abnormal results are displayed) Labs Reviewed  URINALYSIS, ROUTINE W REFLEX MICROSCOPIC - Abnormal; Notable for the following:       Result Value   Hgb urine dipstick LARGE (*)    All other  components within normal limits  CBC WITH DIFFERENTIAL/PLATELET - Abnormal; Notable for the following:    Lymphs Abs 0.5 (*)    All other components within normal limits  PROTIME-INR - Abnormal; Notable for the following:    Prothrombin Time 25.6 (*)    All other components within normal limits  I-STAT CHEM 8, ED - Abnormal; Notable for the following:    BUN 26 (*)    Creatinine, Ser 1.50 (*)    All other components within normal limits    EKG  EKG Interpretation None       Radiology No results found.  Procedures Procedures (including critical care time)  Medications Ordered in ED Medications - No data to display   Initial Impression /  Assessment and Plan / ED Course  I have reviewed the triage vital signs and the nursing notes.  Pertinent labs & imaging results that were available during my care of the patient were reviewed by me and considered in my medical decision making (see chart for details).     Patient with urinary obstruction. Feels much better after Foley catheter. Hematuria without infection. Creatinine mildly increased. Will discharge home to follow-up with urology.  Final Clinical Impressions(s) / ED Diagnoses   Final diagnoses:  Urinary retention  Hematuria, unspecified type    New Prescriptions Discharge Medication List as of 08/15/2016  3:42 PM       Benjiman CorePickering, Faria Casella, MD 08/16/16 [phone]

## 2016-08-15 NOTE — ED Notes (Signed)
Pt is in stable condition upon d/c and ambulates from ED. 

## 2016-08-15 NOTE — ED Notes (Signed)
This RN to hold with protocol order sets per Dr. Rubin PayorPickering, he will be in to see patient shortly and will place orders.

## 2016-08-15 NOTE — Discharge Instructions (Signed)
Talk to the Urologist about the Myrbetriq

## 2016-08-19 DIAGNOSIS — R338 Other retention of urine: Secondary | ICD-10-CM | POA: Diagnosis not present

## 2016-08-21 ENCOUNTER — Encounter (HOSPITAL_COMMUNITY): Payer: Self-pay | Admitting: Emergency Medicine

## 2016-08-21 ENCOUNTER — Emergency Department (HOSPITAL_COMMUNITY)
Admission: EM | Admit: 2016-08-21 | Discharge: 2016-08-21 | Disposition: A | Discharge status: Home / Self Care | Payer: Medicare Other | Attending: Emergency Medicine | Admitting: Emergency Medicine

## 2016-08-21 DIAGNOSIS — I110 Hypertensive heart disease with heart failure: Secondary | ICD-10-CM | POA: Insufficient documentation

## 2016-08-21 DIAGNOSIS — T849XXA Unspecified complication of internal orthopedic prosthetic device, implant and graft, initial encounter: Secondary | ICD-10-CM | POA: Diagnosis not present

## 2016-08-21 DIAGNOSIS — I2510 Atherosclerotic heart disease of native coronary artery without angina pectoris: Secondary | ICD-10-CM | POA: Diagnosis not present

## 2016-08-21 DIAGNOSIS — Z79899 Other long term (current) drug therapy: Secondary | ICD-10-CM | POA: Insufficient documentation

## 2016-08-21 DIAGNOSIS — R319 Hematuria, unspecified: Secondary | ICD-10-CM | POA: Diagnosis not present

## 2016-08-21 DIAGNOSIS — R339 Retention of urine, unspecified: Secondary | ICD-10-CM | POA: Diagnosis not present

## 2016-08-21 DIAGNOSIS — Z87891 Personal history of nicotine dependence: Secondary | ICD-10-CM | POA: Insufficient documentation

## 2016-08-21 DIAGNOSIS — Z7901 Long term (current) use of anticoagulants: Secondary | ICD-10-CM | POA: Diagnosis not present

## 2016-08-21 DIAGNOSIS — I5032 Chronic diastolic (congestive) heart failure: Secondary | ICD-10-CM | POA: Diagnosis not present

## 2016-08-21 DIAGNOSIS — N390 Urinary tract infection, site not specified: Secondary | ICD-10-CM | POA: Diagnosis not present

## 2016-08-21 DIAGNOSIS — T8189XA Other complications of procedures, not elsewhere classified, initial encounter: Secondary | ICD-10-CM | POA: Diagnosis not present

## 2016-08-21 LAB — URINALYSIS, ROUTINE W REFLEX MICROSCOPIC
BILIRUBIN URINE: NEGATIVE
GLUCOSE, UA: NEGATIVE mg/dL
Ketones, ur: NEGATIVE mg/dL
NITRITE: NEGATIVE
PH: 8 (ref 5.0–8.0)
Protein, ur: 100 mg/dL — AB
SPECIFIC GRAVITY, URINE: 1.012 (ref 1.005–1.030)

## 2016-08-21 MED ORDER — CEPHALEXIN 500 MG PO CAPS
500.0000 mg | ORAL_CAPSULE | Freq: Once | ORAL | Status: AC
  Administered 2016-08-21: 500 mg via ORAL
  Filled 2016-08-21: qty 1

## 2016-08-21 MED ORDER — CEPHALEXIN 500 MG PO CAPS: 500.0000 mg | ORAL_CAPSULE | Freq: Four times a day (QID) | ORAL | 0 refills | Status: DC

## 2016-08-21 NOTE — ED Notes (Signed)
Bed: WA09 Expected date:  Expected time:  Means of arrival:  Comments: EMS 

## 2016-08-21 NOTE — Discharge Instructions (Signed)
You have a urinary infection. Will prescribe an antibiotic. Keep your Foley catheter in place until you see the urologist early next week.

## 2016-08-21 NOTE — ED Triage Notes (Signed)
Per EMS, pt fell at home yesterday, EMS assessed patient at that time and patient did not want transport at that time. Since then, pt has been unable to void more than about 0.75 cups of urine and feels abdominal fullness, concerned that this foley catheter was displaced. Wife reports some blood in urine although pt denies. EMS states that patient's orientation level varied intermittently, wife stated that patient is at his baseline.

## 2016-08-21 NOTE — ED Notes (Signed)
Bladder can result: 337 mL

## 2016-08-21 NOTE — ED Provider Notes (Signed)
[facility]-EMERGENCY DEPT Provider Note   CSN: [id number] Arrival date & time: 08/21/16  1018     History   Chief Complaint Chief Complaint  Patient presents with  . Urinary Retention    HPI [name] is a 83 y.o. male.  Patient presents with a concern of urinary retention. He has recently had a catheter placed 6 days ago in the emergency department, but today it did not drain. No fever, sweats, chills, flank pain. He saw the urologist last week. No stated history of prostate issues. Severity of symptoms is moderate.      Past Medical History:  Diagnosis Date  . Atrial fibrillation (HCC)    chronic  . CAD (coronary artery disease)    s/p MI, unsure when  . CHF (congestive heart failure) (HCC)   . Cognitive impairment    from stroke  . 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 DOE (dyspnea on exertion)   . Gout   . HTN (hypertension)   . Hyperlipidemia     Patient Active Problem List   Diagnosis Date Noted  . Tinea pedis 03/26/2016  . Medicare annual wellness visit, subsequent 10/31/2014  . Advanced care planning/counseling discussion 10/31/2014  . DNR (do not resuscitate) 10/31/2014  . Dyspnea on exertion 09/03/2013  . Encounter for therapeutic drug monitoring 05/04/2013  . Gout 09/01/2012  . Lesion of skin of cheek 06/23/2012  . Cognitive impairment   . Permanent atrial fibrillation (HCC) 06/08/2010  . Long term (current) use of anticoagulants 06/08/2010  . Right bundle branch block 06/08/2010  . Cerebrovascular accident, embolic (HCC) 06/08/2010  . Aortic stenosis, mild 06/08/2010  . Chronic diastolic congestive heart failure (HCC) 06/08/2010  . Hypercholesterolemia 06/08/2010  . BPH (benign prostatic hyperplasia) 06/08/2010  . Essential hypertension 06/08/2010    Past Surgical History:  Procedure Laterality Date  . APPENDECTOMY  1994  . CARDIOVASCULAR STRESS TEST  01/11/2008   evidence of  an old scar of the septum and a larger apical scar but no reversible ischemia, EF 48%  . CATARACT EXTRACTION Left 2008   Epps  . EXCISION MORTON'S NEUROMA  1995   x 2  . HEMORRHOID SURGERY    . TONSILLECTOMY  1964  . US ECHOCARDIOGRAPHY  12/19/2007   EF 55-60%, mild aortic stenosis and mild pulmonary hypertension and mild mitral regurgitation       Home Medications    Prior to Admission medications   Medication Sig Start Date End Date Taking? Authorizing Provider  allopurinol (ZYLOPRIM) 100 MG tablet Take 1 tablet (100 mg total) by mouth daily. 03/25/16  Yes Eustaquio Boyden, MD  aspirin 325 MG tablet Take 325 mg by mouth daily.    Yes [provider]  digoxin (LANOXIN) 0.125 MG tablet Take 1 tablet (125 mcg total) by mouth every other day. 12/22/15  Yes Nahser, Deloris Ping, MD  furosemide (LASIX) 40 MG tablet Take 2 tablets (80 mg) by mouth in the morning Patient taking differently: Take 40 mg by mouth daily. Take 2 tablets (80 mg) by mouth in the morning 12/22/15  Yes Nahser, Deloris Ping, MD  lisinopril-hydrochlorothiazide (PRINZIDE,ZESTORETIC) 20-12.5 MG tablet Take 1 tablet by mouth daily.   Yes [provider]  metoprolol succinate (TOPROL-XL) 25 MG 24 hr tablet Take 0.5 tablet (12.5 mg) by mouth daily   Yes [provider]  Omega-3 Fatty Acids (FISH OIL) 1000 MG CAPS Take 1,000 mg by mouth daily.  Yes [provider]  pravastatin (PRAVACHOL) 40 MG tablet Take 0.5 tablet (20 mg) by mouth daily   Yes [provider]  warfarin (COUMADIN) 5 MG tablet Take 1 tablet (5 mg total) by mouth as directed. Take By Mouth As per [facility] 12/22/15  Yes Nahser, Deloris Ping, MD  cephALEXin (KEFLEX) 500 MG capsule Take 1 capsule (500 mg total) by mouth 4 (four) times daily. 08/21/16   Donnetta Hutching, MD  finasteride (PROSCAR) 5 MG tablet Take 5 mg by mouth daily.      [provider]  indomethacin (INDOCIN) 25 MG capsule Take 1 capsule (25 mg total)  by mouth 3 (three) times daily as needed. Patient not taking: Reported on 03/25/2016 12/06/15   Geoffery Lyons, MD  mirabegron ER (MYRBETRIQ) 50 MG TB24 tablet Take 50 mg by mouth daily.    [provider]  nitroGLYCERIN (NITROSTAT) 0.4 MG SL tablet Place 0.4 mg under the tongue every 5 (five) minutes as needed (for chest pain).     [provider]    Family History Family History  Problem Relation Age of Onset  . Cancer Brother        brain  . Cancer Sister        colon  . CAD Sister   . Alcohol abuse Brother     Social History Social History  Substance Use Topics  . Smoking status: Former Smoker    Quit date: 02/08/1950  . Smokeless tobacco: Never Used  . Alcohol use No     Allergies   Benazepril hcl   Review of Systems Review of Systems  All other systems reviewed and are negative.    Physical Exam Updated Vital Signs There were no vitals taken for this visit.  Physical Exam  Constitutional: He is oriented to person, place, and time. He appears well-developed and well-nourished.  HENT:  Head: Normocephalic and atraumatic.  Eyes: Conjunctivae are normal.  Neck: Neck supple.  Cardiovascular: Normal rate and regular rhythm.   Pulmonary/Chest: Effort normal and breath sounds normal.  Abdominal:  Minimal suprapubic tenderness  Musculoskeletal: Normal range of motion.  Neurological: He is alert and oriented to person, place, and time.  Skin: Skin is warm and dry.  Psychiatric: He has a normal mood and affect. His behavior is normal.  Nursing note and vitals reviewed.    ED Treatments / Results  Labs (all labs ordered are listed, but only abnormal results are displayed) Labs Reviewed  URINALYSIS, ROUTINE W REFLEX MICROSCOPIC - Abnormal; Notable for the following:       Result Value   Color, Urine BROWN (*)    APPearance CLOUDY (*)    Hgb urine dipstick LARGE (*)    Protein, ur 100 (*)    Leukocytes, UA LARGE (*)    Bacteria, UA FEW (*)      Squamous Epithelial / LPF 0-5 (*)    All other components within normal limits  URINE CULTURE    EKG  EKG Interpretation None       Radiology No results found.  Procedures Procedures (including critical care time)  Medications Ordered in ED Medications  cephALEXin (KEFLEX) capsule 500 mg (not administered)     Initial Impression / Assessment and Plan / ED Course  I have reviewed the triage vital signs and the nursing notes.  Pertinent labs & imaging results that were available during my care of the patient were reviewed by me and considered in my medical decision making (see  chart for details).     Foley catheter was replaced without complications. Will start cephalexin for a urinary infection. Urine culture pending. Patient will see urologist next week.  Final Clinical Impressions(s) / ED Diagnoses   Final diagnoses:  Urinary retention  Urinary tract infection with hematuria, site unspecified    New Prescriptions New Prescriptions   CEPHALEXIN (KEFLEX) 500 MG CAPSULE    Take 1 capsule (500 mg total) by mouth 4 (four) times daily.     Donnetta Hutchingook, Serai Tukes, MD 08/21/16 1233

## 2016-08-22 ENCOUNTER — Other Ambulatory Visit: Payer: Self-pay | Admitting: Cardiovascular Disease

## 2016-08-23 DIAGNOSIS — N401 Enlarged prostate with lower urinary tract symptoms: Secondary | ICD-10-CM | POA: Diagnosis not present

## 2016-08-23 DIAGNOSIS — R338 Other retention of urine: Secondary | ICD-10-CM | POA: Diagnosis not present

## 2016-08-24 DIAGNOSIS — N320 Bladder-neck obstruction: Secondary | ICD-10-CM | POA: Diagnosis not present

## 2016-08-24 DIAGNOSIS — R338 Other retention of urine: Secondary | ICD-10-CM | POA: Diagnosis not present

## 2016-08-26 LAB — URINE CULTURE: Culture: >=100000 — AB

## 2016-08-27 ENCOUNTER — Telehealth: Payer: Self-pay

## 2016-08-27 NOTE — Telephone Encounter (Signed)
Post ED Visit - Positive Culture Follow-up  Culture report reviewed by antimicrobial stewardship pharmacist:  []  Enzo BiNathan Batchelder, Pharm.D. []  Celedonio MiyamotoJeremy Frens, Pharm.D., BCPS AQ-ID []  Garvin FilaMike Maccia, Pharm.D., BCPS []  Georgina PillionElizabeth Martin, Pharm.D., BCPS []  KeyesMinh Pham, [address] BoulevardPharm.D., BCPS, AAHIVP [x]  Estella HuskMichelle Turner, Pharm.D., BCPS, AAHIVP []  Lysle Pearlachel Rumbarger, PharmD, BCPS []  Casilda Carlsaylor Stone, PharmD, BCPS []  Pollyann SamplesAndy Johnston, PharmD, BCPS  Positive urine culture Treated with Cephalexin, organism sensitive to the same and no further patient follow-up is required at this time.  Jerry CarasCullom, Loray Akard Burnett 08/27/2016, 12:57 PM

## 2016-09-07 ENCOUNTER — Encounter (HOSPITAL_COMMUNITY): Payer: Self-pay | Admitting: Emergency Medicine

## 2016-09-07 ENCOUNTER — Emergency Department (HOSPITAL_COMMUNITY): Payer: Medicare Other

## 2016-09-07 ENCOUNTER — Emergency Department (HOSPITAL_COMMUNITY)
Admission: EM | Admit: 2016-09-07 | Discharge: 2016-09-07 | Disposition: A | Discharge status: Home / Self Care | Payer: Medicare Other | Attending: Physician Assistant | Admitting: Physician Assistant

## 2016-09-07 DIAGNOSIS — I5032 Chronic diastolic (congestive) heart failure: Secondary | ICD-10-CM | POA: Diagnosis not present

## 2016-09-07 DIAGNOSIS — Z7982 Long term (current) use of aspirin: Secondary | ICD-10-CM | POA: Insufficient documentation

## 2016-09-07 DIAGNOSIS — Z8673 Personal history of transient ischemic attack (TIA), and cerebral infarction without residual deficits: Secondary | ICD-10-CM | POA: Insufficient documentation

## 2016-09-07 DIAGNOSIS — Z87891 Personal history of nicotine dependence: Secondary | ICD-10-CM | POA: Diagnosis not present

## 2016-09-07 DIAGNOSIS — Z7901 Long term (current) use of anticoagulants: Secondary | ICD-10-CM | POA: Diagnosis not present

## 2016-09-07 DIAGNOSIS — I4891 Unspecified atrial fibrillation: Secondary | ICD-10-CM | POA: Diagnosis not present

## 2016-09-07 DIAGNOSIS — R531 Weakness: Secondary | ICD-10-CM | POA: Diagnosis not present

## 2016-09-07 DIAGNOSIS — I110 Hypertensive heart disease with heart failure: Secondary | ICD-10-CM | POA: Diagnosis not present

## 2016-09-07 DIAGNOSIS — I2510 Atherosclerotic heart disease of native coronary artery without angina pectoris: Secondary | ICD-10-CM | POA: Diagnosis not present

## 2016-09-07 DIAGNOSIS — Z79899 Other long term (current) drug therapy: Secondary | ICD-10-CM | POA: Insufficient documentation

## 2016-09-07 DIAGNOSIS — M6281 Muscle weakness (generalized): Secondary | ICD-10-CM | POA: Diagnosis not present

## 2016-09-07 DIAGNOSIS — R410 Disorientation, unspecified: Secondary | ICD-10-CM | POA: Diagnosis not present

## 2016-09-07 DIAGNOSIS — R079 Chest pain, unspecified: Secondary | ICD-10-CM | POA: Diagnosis not present

## 2016-09-07 DIAGNOSIS — R031 Nonspecific low blood-pressure reading: Secondary | ICD-10-CM | POA: Diagnosis not present

## 2016-09-07 LAB — CBC WITH DIFFERENTIAL/PLATELET
BASOS ABS: 0 10*3/uL (ref 0.0–0.1)
BASOS PCT: 0 %
Eosinophils Absolute: 0.2 10*3/uL (ref 0.0–0.7)
Eosinophils Relative: 2 %
HEMATOCRIT: 38.6 % — AB (ref 39.0–52.0)
Hemoglobin: 13.7 g/dL (ref 13.0–17.0)
Lymphocytes Relative: 13 %
Lymphs Abs: 1.6 10*3/uL (ref 0.7–4.0)
MCH: 31.3 pg (ref 26.0–34.0)
MCHC: 35.5 g/dL (ref 30.0–36.0)
MCV: 88.1 fL (ref 78.0–100.0)
MONO ABS: 1 10*3/uL (ref 0.1–1.0)
Monocytes Relative: 8 %
NEUTROS ABS: 9.3 10*3/uL — AB (ref 1.7–7.7)
Neutrophils Relative %: 77 %
Platelets: 282 10*3/uL (ref 150–400)
RBC: 4.38 MIL/uL (ref 4.22–5.81)
RDW: 14.3 % (ref 11.5–15.5)
WBC: 12 10*3/uL — AB (ref 4.0–10.5)

## 2016-09-07 LAB — PROTIME-INR
INR: 3.5
PROTHROMBIN TIME: 35.9 s — AB (ref 11.4–15.2)

## 2016-09-07 LAB — COMPREHENSIVE METABOLIC PANEL
ALBUMIN: 3 g/dL — AB (ref 3.5–5.0)
ALT: 45 U/L (ref 17–63)
AST: 37 U/L (ref 15–41)
Alkaline Phosphatase: 80 U/L (ref 38–126)
Anion gap: 10 (ref 5–15)
BILIRUBIN TOTAL: 0.6 mg/dL (ref 0.3–1.2)
BUN: 49 mg/dL — AB (ref 6–20)
CHLORIDE: 101 mmol/L (ref 101–111)
CO2: 22 mmol/L (ref 22–32)
CREATININE: 1.67 mg/dL — AB (ref 0.61–1.24)
Calcium: 10.6 mg/dL — ABNORMAL HIGH (ref 8.9–10.3)
GFR calc Af Amer: 41 mL/min — ABNORMAL LOW (ref >60)
GFR, EST NON AFRICAN AMERICAN: 35 mL/min — AB (ref >60)
GLUCOSE: 110 mg/dL — AB (ref 65–99)
Potassium: 4.2 mmol/L (ref 3.5–5.1)
SODIUM: 133 mmol/L — AB (ref 135–145)
TOTAL PROTEIN: 7.4 g/dL (ref 6.5–8.1)

## 2016-09-07 LAB — URINALYSIS, ROUTINE W REFLEX MICROSCOPIC
Bilirubin Urine: NEGATIVE
Glucose, UA: NEGATIVE mg/dL
Ketones, ur: NEGATIVE mg/dL
Nitrite: NEGATIVE
PROTEIN: NEGATIVE mg/dL
SPECIFIC GRAVITY, URINE: 1.012 (ref 1.005–1.030)
pH: 5 (ref 5.0–8.0)

## 2016-09-07 LAB — I-STAT TROPONIN, ED: Troponin i, poc: 0.04 ng/mL (ref 0.00–0.08)

## 2016-09-07 LAB — I-STAT CG4 LACTIC ACID, ED: LACTIC ACID, VENOUS: 1.68 mmol/L (ref 0.5–1.9)

## 2016-09-07 NOTE — ED Notes (Signed)
ED Provider at bedside. 

## 2016-09-07 NOTE — ED Notes (Signed)
Pt stable, states understanding of discharge instructions 

## 2016-09-07 NOTE — ED Provider Notes (Signed)
[facility]-EMERGENCY DEPT Provider Note   CSN: [id number] Arrival date & time: 09/07/16  1959     History   Chief Complaint Chief Complaint  Patient presents with  . Weakness    HPI [name] is a 83 y.o. male.  HPI   Patient is an 83 year old male brought in by EMS. Patient's wife called EMS because she was unable to get him out of the bathtub. She reports that he doesn't usually use a bathtub, he usually uses a chair. However he was too heavy for her left back bathtub so she called EMS just to help her with the task. She said he did not want EMS to transport him that she felt safe with him at home that he's had nosymptoms. She just wanted help moving. She said that EMS said that he needed to be transported.  Patient has A. fib, CAD, CHF, dementia, history of CVA, chronic indwelling catheter. Patient was being treated as an outpatient for a urinary tract infection with Cipro. He is requiring  1 more dose tonight another dose in the morning and will follow up with urology at 11 AM tomorrow morning.   Past Medical History:  Diagnosis Date  . Atrial fibrillation (HCC)    chronic  . CAD (coronary artery disease)    s/p MI, unsure when  . CHF (congestive heart failure) (HCC)   . Cognitive impairment    from stroke  . 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 DOE (dyspnea on exertion)   . Gout   . HTN (hypertension)   . Hyperlipidemia     Patient Active Problem List   Diagnosis Date Noted  . Tinea pedis 03/26/2016  . Medicare annual wellness visit, subsequent 10/31/2014  . Advanced care planning/counseling discussion 10/31/2014  . DNR (do not resuscitate) 10/31/2014  . Dyspnea on exertion 09/03/2013  . Encounter for therapeutic drug monitoring 05/04/2013  . Gout 09/01/2012  . Lesion of skin of cheek 06/23/2012  . Cognitive impairment   . Permanent atrial fibrillation (HCC) 06/08/2010  . Long term (current)  use of anticoagulants 06/08/2010  . Right bundle branch block 06/08/2010  . Cerebrovascular accident, embolic (HCC) 06/08/2010  . Aortic stenosis, mild 06/08/2010  . Chronic diastolic congestive heart failure (HCC) 06/08/2010  . Hypercholesterolemia 06/08/2010  . BPH (benign prostatic hyperplasia) 06/08/2010  . Essential hypertension 06/08/2010    Past Surgical History:  Procedure Laterality Date  . APPENDECTOMY  1994  . CARDIOVASCULAR STRESS TEST  01/11/2008   evidence of an old scar of the septum and a larger apical scar but no reversible ischemia, EF 48%  . CATARACT EXTRACTION Left 2008   Epps  . EXCISION MORTON'S NEUROMA  1995   x 2  . HEMORRHOID SURGERY    . TONSILLECTOMY  1964  . US ECHOCARDIOGRAPHY  12/19/2007   EF 55-60%, mild aortic stenosis and mild pulmonary hypertension and mild mitral regurgitation       Home Medications    Prior to Admission medications   Medication Sig Start Date End Date Taking? Authorizing Provider  allopurinol (ZYLOPRIM) 100 MG tablet Take 1 tablet (100 mg total) by mouth daily. 03/25/16   Eustaquio BoydenGutierrez, Javier, MD  aspirin 325 MG tablet Take 325 mg by mouth daily.     [provider]  cephALEXin (KEFLEX) 500 MG capsule Take 1 capsule (500 mg total) by mouth 4 (four) times daily. 08/21/16   Donnetta Hutchingook, Brian, MD  digoxin (LANOXIN) 0.125  MG tablet Take 1 tablet (125 mcg total) by mouth every other day. 12/22/15   Nahser, Deloris Ping, MD  furosemide (LASIX) 40 MG tablet Take 2 tablets (80 mg) by mouth in the morning Patient taking differently: Take 40 mg by mouth daily. Take 2 tablets (80 mg) by mouth in the morning 12/22/15   Nahser, Deloris Ping, MD  indomethacin (INDOCIN) 25 MG capsule Take 1 capsule (25 mg total) by mouth 3 (three) times daily as needed. Patient not taking: Reported on 03/25/2016 12/06/15   Geoffery Lyons, MD  lisinopril-hydrochlorothiazide (PRINZIDE,ZESTORETIC) 20-12.5 MG tablet Take 1 tablet by mouth daily.    [provider]   metoprolol succinate (TOPROL-XL) 25 MG 24 hr tablet Take 0.5 tablet (12.5 mg) by mouth daily    [provider]  mirabegron ER (MYRBETRIQ) 50 MG TB24 tablet Take 50 mg by mouth daily.    [provider]  nitroGLYCERIN (NITROSTAT) 0.4 MG SL tablet Place 0.4 mg under the tongue every 5 (five) minutes as needed (for chest pain).     [provider]  Omega-3 Fatty Acids (FISH OIL) 1000 MG CAPS Take 1,000 mg by mouth daily.     [provider]  pravastatin (PRAVACHOL) 40 MG tablet Take 0.5 tablet (20 mg) by mouth daily    [provider]  warfarin (COUMADIN) 5 MG tablet TAKE ONE TABLET BY MOUTH ONCE DAILY AS DIRECTED BY  [facility]OUMADIN  CLIN[facility] 08/23/16   Nahser, Deloris Ping, MD    Family History Family History  Problem Relation Age of Onset  . Cancer Brother        brain  . Cancer Sister        colon  . CAD Sister   . Alcohol abuse Brother     Social History Social History  Substance Use Topics  . Smoking status: Former Smoker    Quit date: 02/08/1950  . Smokeless tobacco: Never Used  . Alcohol use No     Allergies   Benazepril hcl   Review of Systems Review of Systems  Constitutional: Negative for activity change, fatigue and fever.  Respiratory: Negative for shortness of breath.   Cardiovascular: Negative for chest pain.  Gastrointestinal: Negative for abdominal pain.     Physical Exam Updated Vital Signs BP (!) 148/65   Pulse 83   Temp 98.2 F (36.8 C) (Oral)   Resp 19   SpO2 97%   Physical Exam  Constitutional: He is oriented to person, place, and time. He appears well-nourished.  HENT:  Head: Normocephalic.  Eyes: Conjunctivae are normal.  Cardiovascular:  No murmur heard. Irregular rhythm.  Pulmonary/Chest: Effort normal and breath sounds normal. No respiratory distress.  Abdominal: Soft. There is no tenderness.  Genitourinary:  Genitourinary Comments: Leg bag in place.  Neurological: He is oriented to person, place,  and time.  Skin: Skin is warm and dry. He is not diaphoretic.  Psychiatric: He has a normal mood and affect. His behavior is normal.     ED Treatments / Results  Labs (all labs ordered are listed, but only abnormal results are displayed) Labs Reviewed  COMPREHENSIVE METABOLIC PANEL - Abnormal; Notable for the following:       Result Value   Sodium 133 (*)    Glucose, Bld 110 (*)    BUN 49 (*)    Creatinine, Ser 1.67 (*)    Calcium 10.6 (*)    Albumin 3.0 (*)    GFR calc non Af Amer 35 (*)  GFR calc Af Amer 41 (*)    All other components within normal limits  CBC WITH DIFFERENTIAL/PLATELET - Abnormal; Notable for the following:    WBC 12.0 (*)    HCT 38.6 (*)    Neutro Abs 9.3 (*)    All other components within normal limits  URINALYSIS, ROUTINE W REFLEX MICROSCOPIC - Abnormal; Notable for the following:    APPearance HAZY (*)    Hgb urine dipstick LARGE (*)    Leukocytes, UA LARGE (*)    Bacteria, UA RARE (*)    Squamous Epithelial / LPF 0-5 (*)    All other components within normal limits  URINE CULTURE  PROTIME-INR  I-STAT TROPONIN, ED  I-STAT CG4 LACTIC ACID, ED    EKG  EKG Interpretation None       Radiology Dg Chest 2 View  Result Date: 09/07/2016 CLINICAL DATA:  83 year old male with chest pain. EXAM: CHEST  2 VIEW COMPARISON:  Chest radiograph dated 12/06/2015 FINDINGS: There is shallow inspiration. No focal consolidation, pleural effusion, or pneumothorax. Stable top-normal cardiac size. Atherosclerotic calcification of the aorta. No acute osseous pathology. IMPRESSION: No active cardiopulmonary disease. Electronically Signed   By: Elgie CollardArash  Radparvar M.D.   On: 09/07/2016 21:30   Ct Head Wo Contrast  Result Date: 09/07/2016 CLINICAL DATA:  Confusion and difficulty walking EXAM: CT HEAD WITHOUT CONTRAST TECHNIQUE: Contiguous axial images were obtained from the base of the skull through the vertex without intravenous contrast. COMPARISON:  Head CT 09/08/2010  FINDINGS: Brain: No mass lesion, intraparenchymal hemorrhage or extra-axial collection. No evidence of acute cortical infarct. There is periventricular hypoattenuation compatible with chronic microvascular disease. Unchanged left parietal encephalomalacia at the site of prior infarct. Unchanged old right cerebellar infarct. Vascular: No hyperdense vessel or unexpected calcification. Skull: Normal visualized skull base, calvarium and extracranial soft tissues. Sinuses/Orbits: Frothy secretions in the right maxillary sinus. Left maxillary retention cyst. Normal orbits. IMPRESSION: 1. No acute intracranial abnormality. 2. Old left parietal and right cerebellar infarcts. Electronically Signed   By: Deatra RobinsonKevin  Herman M.D.   On: 09/07/2016 21:28    Procedures Procedures (including critical care time)  Medications Ordered in ED Medications - No data to display   Initial Impression / Assessment and Plan / ED Course  I have reviewed the triage vital signs and the nursing notes.  Pertinent labs & imaging results that were available during my care of the patient were reviewed by me and considered in my medical decision making (see chart for details).     Patient's 83 year old male brought here by EMS. Patient's wife does not think he's been any different than usual. She do not want him come hospital. Here his workup is been negative, including a normal head CT, chest x-ray, labs. Urine shows leukocytes however he has a leg bag so will send for urine culture. He'll follow up with urology in the morning. Patient and wife would like him to return home and I see no reason for admission at this time.  Final Clinical Impressions(s) / ED Diagnoses   Final diagnoses:  None    New Prescriptions New Prescriptions   No medications on file     Abelino DerrickMackuen, Keylani Perlstein Lyn, MD 09/07/16 2334

## 2016-09-07 NOTE — ED Notes (Signed)
Patient transported to X-ray 

## 2016-09-07 NOTE — ED Notes (Signed)
Patient transported to CT 

## 2016-09-07 NOTE — ED Triage Notes (Signed)
Patient from home and lives with wife.  Ususally able to ambulate without problem with his walker, but today unable to get out of bath tub due to being weak.  Patient is oriented to place, self and events, but is confused to time and date.  Patient does have sore on left but cheek.  Patient is bleeding rectally.

## 2016-09-07 NOTE — Discharge Instructions (Signed)
Follow up with your urologist as planned. Please return with any concerns. Also, please follow up with your primary about your INR which is 3.5 today.

## 2016-09-08 DIAGNOSIS — R338 Other retention of urine: Secondary | ICD-10-CM | POA: Diagnosis not present

## 2016-09-08 NOTE — Care Management Note (Signed)
Case Management Note  Patient Details  Name: [name] MRN: [id number] Date of Birth: February 14, 1925  Subjective/Objective:      Patient presented to [facility] ED with c/o  Increase weakness   Patient unable to get out of bathtub          Action/Plan: CM consulted concerning [place] recommendations. Patient has BC[employer] Medicare/ Dr. Danise Mina PCP. CM met with patient and spouse at bedside to discuss recommendations for [facility] services, Patient lives at home with wife primary caregiver. CM explained [facility] services, spouse is agreeable to receive services. Offered choice [facility] selected . CM verified contact information. CM faxed referral to [facility] 336 [phone]. CM made patient and family informed them that within 24-48 hours the [facility] nurse will contact them at the verified number. Family verbalized understanding teach back done. Denies any further  concerns or questions. No further ED CM needs identified  Expected Discharge Date:                  Expected Discharge Plan:  [place]  In-House Referral:     Discharge planning Services  CM Consult  Post Acute Care Choice:    Choice offered to:  Patient, Spouse, Adult Children  DME Arranged:    DME Agency:     HH Arranged:  RN, OT, Nurse's Aide, PT, Social Work [employer] Agency:  [place]  Status of Service:  Completed, signed off  If discussed at [employer], dates discussed:    Additional CommentsLaurena Slimmer, RN 09/08/2016, 1:29 PM

## 2016-09-09 ENCOUNTER — Telehealth: Payer: Self-pay | Admitting: Family Medicine

## 2016-09-09 DIAGNOSIS — Z66 Do not resuscitate: Secondary | ICD-10-CM

## 2016-09-09 DIAGNOSIS — I4821 Permanent atrial fibrillation: Secondary | ICD-10-CM

## 2016-09-09 DIAGNOSIS — I6310 Cerebral infarction due to embolism of unspecified precerebral artery: Secondary | ICD-10-CM

## 2016-09-09 DIAGNOSIS — I5032 Chronic diastolic (congestive) heart failure: Secondary | ICD-10-CM

## 2016-09-09 DIAGNOSIS — R4189 Other symptoms and signs involving cognitive functions and awareness: Secondary | ICD-10-CM

## 2016-09-09 DIAGNOSIS — N400 Enlarged prostate without lower urinary tract symptoms: Secondary | ICD-10-CM

## 2016-09-09 DIAGNOSIS — R0609 Other forms of dyspnea: Secondary | ICD-10-CM

## 2016-09-09 DIAGNOSIS — R0600 Dyspnea, unspecified: Secondary | ICD-10-CM

## 2016-09-09 LAB — URINE CULTURE: Culture: NO GROWTH

## 2016-09-09 NOTE — Telephone Encounter (Signed)
cont'd-Pt needs to re-gain strength in his legs.    Also, pt has catheter and needs help with changing, so some nursing may be appropriate too.

## 2016-09-09 NOTE — Telephone Encounter (Signed)
Dr. Sharen HonesGutierrez, [facility] Pt's wife was told at his recent ER visit [facility], but has not hear from anyone. I do not see referral either from Hospital. Please advise.

## 2016-09-09 NOTE — Telephone Encounter (Signed)
Noted  Referral placed.

## 2016-09-09 NOTE — Progress Notes (Signed)
Pre visit review using our clinic review tool, if applicable. No additional management support is needed unless otherwise documented below in the visit note. 

## 2016-09-13 ENCOUNTER — Telehealth: Payer: Self-pay

## 2016-09-13 ENCOUNTER — Other Ambulatory Visit: Payer: Self-pay | Admitting: Family Medicine

## 2016-09-13 DIAGNOSIS — L89313 Pressure ulcer of right buttock, stage 3: Secondary | ICD-10-CM | POA: Diagnosis not present

## 2016-09-13 DIAGNOSIS — F0390 Unspecified dementia without behavioral disturbance: Secondary | ICD-10-CM | POA: Diagnosis not present

## 2016-09-13 DIAGNOSIS — E7800 Pure hypercholesterolemia, unspecified: Secondary | ICD-10-CM

## 2016-09-13 DIAGNOSIS — L89323 Pressure ulcer of left buttock, stage 3: Secondary | ICD-10-CM | POA: Diagnosis not present

## 2016-09-13 DIAGNOSIS — I5032 Chronic diastolic (congestive) heart failure: Secondary | ICD-10-CM | POA: Diagnosis not present

## 2016-09-13 DIAGNOSIS — I69311 Memory deficit following cerebral infarction: Secondary | ICD-10-CM | POA: Diagnosis not present

## 2016-09-13 DIAGNOSIS — M109 Gout, unspecified: Secondary | ICD-10-CM | POA: Diagnosis not present

## 2016-09-13 DIAGNOSIS — N390 Urinary tract infection, site not specified: Secondary | ICD-10-CM | POA: Diagnosis not present

## 2016-09-13 DIAGNOSIS — I110 Hypertensive heart disease with heart failure: Secondary | ICD-10-CM | POA: Diagnosis not present

## 2016-09-13 DIAGNOSIS — M1A9XX0 Chronic gout, unspecified, without tophus (tophi): Secondary | ICD-10-CM

## 2016-09-13 DIAGNOSIS — I4891 Unspecified atrial fibrillation: Secondary | ICD-10-CM | POA: Diagnosis not present

## 2016-09-13 DIAGNOSIS — I2510 Atherosclerotic heart disease of native coronary artery without angina pectoris: Secondary | ICD-10-CM | POA: Diagnosis not present

## 2016-09-13 NOTE — Telephone Encounter (Signed)
Spoke to [facility] and provided verbal orders

## 2016-09-13 NOTE — Telephone Encounter (Signed)
Holly nurse with [facility] left v/m [facility]

## 2016-09-13 NOTE — Telephone Encounter (Signed)
Ok to do this/thanks 

## 2016-09-14 ENCOUNTER — Telehealth: Payer: Self-pay

## 2016-09-14 NOTE — Telephone Encounter (Signed)
Holly nurse with [facility] left v/m;pt was seen and had three stage 3 pressure ulcers in place; sent pictures to wound nurse and she has recommended to clean area with moisturizing cleanser and the calcium alginate on top of wound then an allevyn dressing. Chg dressing twice a week. Holly wanted DR G to be aware of wound care. Holly request cb.

## 2016-09-14 NOTE — Telephone Encounter (Signed)
Noted! Thank you

## 2016-09-16 ENCOUNTER — Other Ambulatory Visit: Payer: Medicare Other

## 2016-09-16 ENCOUNTER — Ambulatory Visit (INDEPENDENT_AMBULATORY_CARE_PROVIDER_SITE_OTHER): Payer: Medicare Other

## 2016-09-16 ENCOUNTER — Telehealth: Payer: Self-pay | Admitting: Family Medicine

## 2016-09-16 VITALS — BP 110/60 | HR 50 | Temp 98.7°F | Ht 67.0 in | Wt 198.5 lb

## 2016-09-16 DIAGNOSIS — M1A9XX0 Chronic gout, unspecified, without tophus (tophi): Secondary | ICD-10-CM

## 2016-09-16 DIAGNOSIS — Z0000 Encounter for general adult medical examination without abnormal findings: Secondary | ICD-10-CM | POA: Diagnosis not present

## 2016-09-16 DIAGNOSIS — E7800 Pure hypercholesterolemia, unspecified: Secondary | ICD-10-CM | POA: Diagnosis not present

## 2016-09-16 LAB — RENAL FUNCTION PANEL
Albumin: 3.7 g/dL (ref 3.5–5.2)
BUN: 41 mg/dL — ABNORMAL HIGH (ref 6–23)
CALCIUM: 10.7 mg/dL — AB (ref 8.4–10.5)
CO2: 29 mEq/L (ref 19–32)
CREATININE: 1.39 mg/dL (ref 0.40–1.50)
Chloride: 99 mEq/L (ref 96–112)
GFR: 51.14 mL/min — AB (ref >60.00)
GLUCOSE: 110 mg/dL — AB (ref 70–99)
PHOSPHORUS: 2.9 mg/dL (ref 2.3–4.6)
Potassium: 4.3 mEq/L (ref 3.5–5.1)
SODIUM: 134 meq/L — AB (ref 135–145)

## 2016-09-16 LAB — LIPID PANEL
CHOL/HDL RATIO: 3
Cholesterol: 103 mg/dL (ref 0–200)
HDL: 40 mg/dL (ref >39.00)
LDL Cholesterol: 52 mg/dL (ref 0–99)
NONHDL: 63.34
Triglycerides: 55 mg/dL (ref 0.0–149.0)
VLDL: 11 mg/dL (ref 0.0–40.0)

## 2016-09-16 LAB — URIC ACID: URIC ACID, SERUM: 6.8 mg/dL (ref 4.0–7.8)

## 2016-09-16 NOTE — Telephone Encounter (Signed)
Agree with this. Thanks.  

## 2016-09-16 NOTE — Patient Instructions (Signed)
[name] , Thank you for taking time to come for your Medicare Wellness Visit. I appreciate your ongoing commitment to your health goals. Please review the following plan we discussed and let me know if I can assist you in the future.   These are the goals we discussed: Goals    . SAFETY          Starting 09/16/16, I will continue to use walker as needed to reduce risk of falling.        This is a list of the screening recommended for you and due dates:  Health Maintenance  Topic Date Due  . DTaP/Tdap/Td vaccine (1 - Tdap) 09/16/2048*  . Flu Shot  09/16/2048*  . Tetanus Vaccine  09/16/2048*  . Pneumonia vaccines (1 of 2 - PCV13) 09/16/2048*  *Topic was postponed. The date shown is not the original due date.   Preventive Care for Adults  A healthy lifestyle and preventive care can promote health and wellness. Preventive health guidelines for adults include the following key practices.  . A routine yearly physical is a good way to check with your health care provider about your health and preventive screening. It is a chance to share any concerns and updates on your health and to receive a thorough exam.  . Visit your dentist for a routine exam and preventive care every 6 months. Brush your teeth twice a day and floss once a day. Good oral hygiene prevents tooth decay and gum disease.  . The frequency of eye exams is based on your age, health, family medical history, use  of contact lenses, and other factors. Follow your health care provider's ecommendations for frequency of eye exams.  . Eat a healthy diet. Foods like vegetables, fruits, whole grains, low-fat dairy products, and lean protein foods contain the nutrients you need without too many calories. Decrease your intake of foods high in solid fats, added sugars, and salt. Eat the right amount of calories for you. Get information about a proper diet from your health care provider, if necessary.  . Regular physical exercise is one  of the most important things you can do for your health. Most adults should get at least 150 minutes of moderate-intensity exercise (any activity that increases your heart rate and causes you to sweat) each week. In addition, most adults need muscle-strengthening exercises on 2 or more days a week.  Silver Sneakers may be a benefit available to you. To determine eligibility, you may visit the website: www.silversneakers.com or contact program at [phone] Mon-Fri between 8AM-8PM.   . Maintain a healthy weight. The body mass index (BMI) is a screening tool to identify possible weight problems. It provides an estimate of body fat based on height and weight. Your health care provider can find your BMI and can help you achieve or maintain a healthy weight.   For adults 20 years and older: ? A BMI below 18.5 is considered underweight. ? A BMI of 18.5 to 24.9 is normal. ? A BMI of 25 to 29.9 is considered overweight. ? A BMI of 30 and above is considered obese.   . Maintain normal blood lipids and cholesterol levels by exercising and minimizing your intake of saturated fat. Eat a balanced diet with plenty of fruit and vegetables. Blood tests for lipids and cholesterol should begin at age 81 and be repeated every 5 years. If your lipid or cholesterol levels are high, you are over 50, or you are at high risk for heart  disease, you may need your cholesterol levels checked more frequently. Ongoing high lipid and cholesterol levels should be treated with medicines if diet and exercise are not working.  . If you smoke, find out from your health care provider how to quit. If you do not use tobacco, please do not start.  . If you choose to drink alcohol, please do not consume more than 2 drinks per day. One drink is considered to be 12 ounces (355 mL) of beer, 5 ounces (148 mL) of wine, or 1.5 ounces (44 mL) of liquor.  . If you are 52-24 years old, ask your health care provider if you should take aspirin  to prevent strokes.  . Use sunscreen. Apply sunscreen liberally and repeatedly throughout the day. You should seek shade when your shadow is shorter than you. Protect yourself by wearing long sleeves, pants, a wide-brimmed hat, and sunglasses year round, whenever you are outdoors.  . Once a month, do a whole body skin exam, using a mirror to look at the skin on your back. Tell your health care provider of new moles, moles that have irregular borders, moles that are larger than a pencil eraser, or moles that have changed in shape or color.

## 2016-09-16 NOTE — Telephone Encounter (Signed)
Caller Name:Toni Relationship to Patient:[facility]   Best number:[phone] Pharmacy:  Reason for call:  Request verbal orders for speech therapy to see pt 2 more times in next month

## 2016-09-16 NOTE — Progress Notes (Signed)
Subjective:   Ladean RayaCharles Lloyd Monsanto is a 83 y.o. male who presents for Medicare Annual/Subsequent preventive examination.  Review of Systems:  N/A Cardiac Risk Factors include: advanced age (>7955men, 9>65 women);male gender;obesity (BMI >30kg/m2);dyslipidemia;hypertension     Objective:    Vitals: BP 110/60 (BP Location: Right Arm, Patient Position: Sitting, Cuff Size: Normal)   Pulse (!) 50   Temp 98.7 F (37.1 C) (Oral)   Ht 5\' 7"  (1.702 m) Comment: shoes  Wt 198 lb 8 oz (90 kg)   SpO2 97%   BMI 31.09 kg/m   Body mass index is 31.09 kg/m.  Tobacco History  Smoking Status  . Former Smoker  . Quit date: 02/08/1950  Smokeless Tobacco  . Never Used     Counseling given: No   Past Medical History:  Diagnosis Date  . Atrial fibrillation (HCC)    chronic  . CAD (coronary artery disease)    s/p MI, unsure when  . CHF (congestive heart failure) (HCC)   . Cognitive impairment    from stroke  . 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 DOE (dyspnea on exertion)   . Gout   . HTN (hypertension)   . Hyperlipidemia    Past Surgical History:  Procedure Laterality Date  . APPENDECTOMY  1994  . CARDIOVASCULAR STRESS TEST  01/11/2008   evidence of an old scar of the septum and a larger apical scar but no reversible ischemia, EF 48%  . CATARACT EXTRACTION Left 2008   [facility]  . EXCISION MORTON'S NEUROMA  1995   x 2  . HEMORRHOID SURGERY    . TONSILLECTOMY  1964  . US ECHOCARDIOGRAPHY  12/19/2007   EF 55-60%, mild aortic stenosis and mild pulmonary hypertension and mild mitral regurgitation   Family History  Problem Relation Age of Onset  . Cancer Brother        brain  . Cancer Sister        colon  . CAD Sister   . Alcohol abuse Brother    History  Sexual Activity  . Sexual activity: Not on file    Outpatient Encounter Prescriptions as of 09/16/2016  Medication Sig  . allopurinol (ZYLOPRIM) 100 MG tablet Take 1  tablet (100 mg total) by mouth daily.  Marland Kitchen. aspirin 325 MG tablet Take 325 mg by mouth daily.   . digoxin (LANOXIN) 0.125 MG tablet Take 1 tablet (125 mcg total) by mouth every other day.  . furosemide (LASIX) 40 MG tablet Take 2 tablets (80 mg) by mouth in the morning (Patient taking differently: Take 40 mg by mouth daily. Take 2 tablets (80 mg) by mouth in the morning)  . lisinopril-hydrochlorothiazide (PRINZIDE,ZESTORETIC) 20-12.5 MG tablet Take 1 tablet by mouth daily.  . metoprolol succinate (TOPROL-XL) 25 MG 24 hr tablet Take 0.5 tablet (12.5 mg) by mouth daily  . Omega-3 Fatty Acids (FISH OIL) 1000 MG CAPS Take 1,000 mg by mouth daily.   . pravastatin (PRAVACHOL) 40 MG tablet Take 0.5 tablet (20 mg) by mouth daily  . warfarin (COUMADIN) 5 MG tablet TAKE ONE TABLET BY MOUTH ONCE DAILY AS DIRECTED BY  COUMADIN  CLINIC  . indomethacin (INDOCIN) 25 MG capsule Take 1 capsule (25 mg total) by mouth 3 (three) times daily as needed. (Patient not taking: Reported on 03/25/2016)  . mirabegron ER (MYRBETRIQ) 50 MG TB24 tablet Take 50 mg by mouth daily.  . nitroGLYCERIN (NITROSTAT) 0.4 MG SL tablet Place 0.4  mg under the tongue every 5 (five) minutes as needed (for chest pain).   . [DISCONTINUED] cephALEXin (KEFLEX) 500 MG capsule Take 1 capsule (500 mg total) by mouth 4 (four) times daily.   No facility-administered encounter medications on file as of 09/16/2016.     Activities of Daily Living In your present state of health, do you have any difficulty performing the following activities: 09/16/2016  Hearing? Y  Vision? Y  Difficulty concentrating or making decisions? Y  Walking or climbing stairs? Y  Dressing or bathing? N  Doing errands, shopping? Y  Preparing Food and eating ? Y  Using the Toilet? Y  In the past six months, have you accidently leaked urine? N  Comment pt has catheter  Do you have problems with loss of bowel control? N  Managing your Medications? Y  Managing your Finances? Y    Housekeeping or managing your Housekeeping? Y  Some recent data might be hidden    Patient Care Team: Eustaquio Boyden, MD as PCP - General (Family Medicine)   Assessment:     Hearing Screening   125Hz  250Hz  500Hz  1000Hz  2000Hz  3000Hz  4000Hz  6000Hz  8000Hz   Right ear:   0 0 0  0    Left ear:   0 0 0  0    Vision Screening Comments: Patient was unable to complete vision screen   Exercise Activities and Dietary recommendations Current Exercise Habits: The patient does not participate in regular exercise at present, Exercise limited by: orthopedic condition(s)  Goals    . SAFETY          Starting 09/16/16, I will continue to use walker as needed to reduce risk of falling.       Fall Risk Fall Risk  09/16/2016 10/31/2014  Falls in the past year? No No  Risk for fall due to : Impaired balance/gait;Impaired mobility;Impaired vision Impaired balance/gait;Impaired mobility   Depression Screen PHQ 2/9 Scores 09/16/2016 10/31/2014  PHQ - 2 Score 0 0    Cognitive Function MMSE - Mini Mental State Exam 09/16/2016  Orientation to time 0  Orientation to Place 0  Registration 3  Attention/ Calculation 0  Recall 0  Language- name 2 objects 0  Language- repeat 1  Language- follow 3 step command 0  Language- follow 3 step command-comments visual difficulty barrier to clock drawing  Language- read & follow direction 0  Write a sentence 0  Copy design 0  Total score 4       PLEASE NOTE: A Mini-Cog screen was completed. Maximum score is 17. A value of 0 denotes this part of Folstein MMSE was not completed or the patient failed this part of the Mini-Cog screening.   Mini-Cog Screening Orientation to Time - Max 5 pts Orientation to Place - Max 5 pts Registration - Max 3 pts Recall - Max 3 pts Language Repeat - Max 1 pts Language Follow 3 Step Command - Max 0 pts (visual difficulty; clock drawing not completed)   There is no immunization history on file for this patient. Screening  Tests Health Maintenance  Topic Date Due  . DTaP/Tdap/Td (1 - Tdap) 09/16/2048 (Originally 09/19/1944)  . INFLUENZA VACCINE  09/16/2048 (Originally 09/08/2016)  . TETANUS/TDAP  09/16/2048 (Originally 09/19/1944)  . PNA vac Low Risk Adult (1 of 2 - PCV13) 09/16/2048 (Originally 09/18/1992)      Plan:     I have personally reviewed and addressed the Medicare Annual Wellness questionnaire and have noted the following in the patient's  chart:  A. Medical and social history B. Use of alcohol, tobacco or illicit drugs  C. Current medications and supplements D. Functional ability and status E.  Nutritional status F.  Physical activity G. Advance directives H. List of other physicians I.  Hospitalizations, surgeries, and ER visits in previous 12 months J.  [place] to include hearing, vision, cognitive, depression L. Referrals and appointments - none  In addition, I have reviewed and discussed with patient certain preventive protocols, quality metrics, and best practice recommendations. A written personalized care plan for preventive services as well as general preventive health recommendations were provided to patient.  See attached scanned questionnaire for additional information.   Signed,   Lindell Noe, MHA, BS, LPN Health Coach

## 2016-09-16 NOTE — Telephone Encounter (Signed)
VO given to [place], not [place] [facility]

## 2016-09-16 NOTE — Progress Notes (Signed)
PCP notes:   Health maintenance:  Vaccines - per spouse, pt will not take any vaccines  Abnormal screenings:   Mini-Cog score: 4/17 Hearing - failed  Patient concerns:   Per spouse, patient has some areas of skin breakdown on both buttocks. She has been applying "salve" to areas and states this has been effective.   Nurse concerns:  None  Next PCP appt:   10/06/16 @ 1230

## 2016-09-17 NOTE — Progress Notes (Signed)
I reviewed health advisor's note, was available for consultation, and agree with documentation and plan. Rosamae Rocque Kendal, NP  

## 2016-09-23 ENCOUNTER — Encounter: Payer: Medicare Other | Admitting: Family Medicine

## 2016-09-29 ENCOUNTER — Ambulatory Visit (INDEPENDENT_AMBULATORY_CARE_PROVIDER_SITE_OTHER): Payer: Medicare Other | Admitting: *Deleted

## 2016-09-29 DIAGNOSIS — I4891 Unspecified atrial fibrillation: Secondary | ICD-10-CM | POA: Diagnosis not present

## 2016-09-29 DIAGNOSIS — Z7901 Long term (current) use of anticoagulants: Secondary | ICD-10-CM | POA: Diagnosis not present

## 2016-09-29 DIAGNOSIS — Z5181 Encounter for therapeutic drug level monitoring: Secondary | ICD-10-CM | POA: Diagnosis not present

## 2016-09-29 DIAGNOSIS — I4821 Permanent atrial fibrillation: Secondary | ICD-10-CM

## 2016-09-29 DIAGNOSIS — I482 Chronic atrial fibrillation: Secondary | ICD-10-CM | POA: Diagnosis not present

## 2016-09-29 LAB — POCT INR: INR: 2.4

## 2016-10-01 DIAGNOSIS — R338 Other retention of urine: Secondary | ICD-10-CM | POA: Diagnosis not present

## 2016-10-06 ENCOUNTER — Encounter: Payer: Self-pay | Admitting: Family Medicine

## 2016-10-06 ENCOUNTER — Ambulatory Visit (INDEPENDENT_AMBULATORY_CARE_PROVIDER_SITE_OTHER): Payer: Medicare Other | Admitting: Family Medicine

## 2016-10-06 VITALS — BP 124/64 | HR 70 | Temp 98.0°F | Wt 208.5 lb

## 2016-10-06 DIAGNOSIS — I5032 Chronic diastolic (congestive) heart failure: Secondary | ICD-10-CM

## 2016-10-06 DIAGNOSIS — Z7189 Other specified counseling: Secondary | ICD-10-CM

## 2016-10-06 DIAGNOSIS — M1A9XX0 Chronic gout, unspecified, without tophus (tophi): Secondary | ICD-10-CM

## 2016-10-06 DIAGNOSIS — I69320 Aphasia following cerebral infarction: Secondary | ICD-10-CM

## 2016-10-06 DIAGNOSIS — E210 Primary hyperparathyroidism: Secondary | ICD-10-CM | POA: Insufficient documentation

## 2016-10-06 DIAGNOSIS — R4189 Other symptoms and signs involving cognitive functions and awareness: Secondary | ICD-10-CM

## 2016-10-06 DIAGNOSIS — Z66 Do not resuscitate: Secondary | ICD-10-CM

## 2016-10-06 DIAGNOSIS — I10 Essential (primary) hypertension: Secondary | ICD-10-CM

## 2016-10-06 DIAGNOSIS — Z0000 Encounter for general adult medical examination without abnormal findings: Secondary | ICD-10-CM | POA: Diagnosis not present

## 2016-10-06 DIAGNOSIS — E7800 Pure hypercholesterolemia, unspecified: Secondary | ICD-10-CM

## 2016-10-06 DIAGNOSIS — L89159 Pressure ulcer of sacral region, unspecified stage: Secondary | ICD-10-CM | POA: Insufficient documentation

## 2016-10-06 DIAGNOSIS — R32 Unspecified urinary incontinence: Secondary | ICD-10-CM

## 2016-10-06 DIAGNOSIS — L89152 Pressure ulcer of sacral region, stage 2: Secondary | ICD-10-CM

## 2016-10-06 DIAGNOSIS — I4821 Permanent atrial fibrillation: Secondary | ICD-10-CM

## 2016-10-06 DIAGNOSIS — L989 Disorder of the skin and subcutaneous tissue, unspecified: Secondary | ICD-10-CM

## 2016-10-06 NOTE — Assessment & Plan Note (Signed)
Stable period on low dose allopurinol.

## 2016-10-06 NOTE — Assessment & Plan Note (Signed)
Stroke related. Wife and niece deny any cognitive changes or different level of care needed.

## 2016-10-06 NOTE — Assessment & Plan Note (Signed)
Continue coumadin followed by cardiology clinic  

## 2016-10-06 NOTE — Assessment & Plan Note (Signed)
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 Doesn't know name of salve she is using. No signs of secondary infection today. I did recommend continue home care and suggested 3 mo f/u in office for recheck to ensure healing well.

## 2016-10-06 NOTE — Patient Instructions (Addendum)
Prescription for light wheelchair provided today  Continue dressing changes for sacral sore - and ensure continuing to improve each day. Let us know if enlarging or streaking redness or new draining.  DNR form filled out today.  Return in 3 months for lab visit and follow up.

## 2016-10-06 NOTE — Assessment & Plan Note (Signed)
Advanced planning - working on finalizing this. HCPOA is niece [name]. No prolonged life support. Wants to be DNR. Form filled out today.  

## 2016-10-06 NOTE — Assessment & Plan Note (Signed)
Form updated today.

## 2016-10-06 NOTE — Assessment & Plan Note (Addendum)
Will recheck at 3 mo f/u visit.

## 2016-10-06 NOTE — Progress Notes (Signed)
BP 124/64   Pulse 70   Temp 98 F (36.7 C) (Oral)   Wt 208 lb 8 oz (94.6 kg)   SpO2 95%   BMI 32.66 kg/m    CC: CPE Subjective:    Patient ID: [name], male    DOB: 1925/02/28, 83 y.o.   MRN: [id number]  HPI: [name] is a 83 y.o. male presenting on 10/06/2016 for Annual Exam (Medicare pt 2)   Here with wife and niece Ninetta LightsCarla  Saw Lesia earlier in the month for medicare wellness visit. Note reviewed. Failed hearing, failed cognitive assessment, declines vaccinations. They decline any memory trouble at homeWife denies trouble hearing and declines hearing evaluation.   Skin breakdown on sacral region - treated initially by [facility] with calcium alginate on wounds. Present for last 2-3 weeks. Wife has been treating - with some green salve at home. Some bowel incontinence that is now better.   Urinary incontinence - indwelling foley catheter for last 3+ wks, changed last week, followed by Dr Annabell HowellsWrenn. Recently completed cipro course for UTI. Myrbetriq was too expensive. Planned f/u 10/25/2016.   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  Preventative: Colon cancer screening - age out. No blood in stool or BM changes Prostate cancer screen - aged out Declines flu shot  Td 2007  Pneumonia - declines  zostavax - declines Advanced planning - working on finalizing this. HCPOA is niece Ashley MurrainCarla Chambers. No prolonged life support. Wants to be DNR. Form filled out today.   [place]ives [place] 2 [place]. Estranged from local son. Not close to sons. Sees grandchildren regularly. Occupation: retired, prior Personnel officersold insurance and accountant  Relevant past medical, surgical, family and social history reviewed and updated as indicated. Interim medical history since our last visit reviewed. Allergies and medications reviewed and updated. Outpatient Medications Prior to Visit    Medication Sig Dispense Refill  . allopurinol (ZYLOPRIM) 100 MG tablet Take 1 tablet (100 mg total) by mouth daily. 90 tablet 3  . aspirin 325 MG tablet Take 325 mg by mouth daily.     . digoxin (LANOXIN) 0.125 MG tablet Take 1 tablet (125 mcg total) by mouth every other day. 45 tablet 3  . furosemide (LASIX) 40 MG tablet Take 2 tablets (80 mg) by mouth in the morning (Patient taking differently: Take 40 mg by mouth daily. Take 2 tablets (80 mg) by mouth in the morning) 180 tablet 3  . lisinopril-hydrochlorothiazide (PRINZIDE,ZESTORETIC) 20-12.5 MG tablet Take 1 tablet by mouth daily.    . metoprolol succinate (TOPROL-XL) 25 MG 24 hr tablet Take 0.5 tablet (12.5 mg) by mouth daily    . nitroGLYCERIN (NITROSTAT) 0.4 MG SL tablet Place 0.4 mg under the tongue every 5 (five) minutes as needed (for chest pain).     . Omega-3 Fatty Acids (FISH OIL) 1000 MG CAPS Take 1,000 mg by mouth daily.     . pravastatin (PRAVACHOL) 40 MG tablet Take 0.5 tablet (20 mg) by mouth daily    . warfarin (COUMADIN) 5 MG tablet TAKE ONE TABLET BY MOUTH ONCE DAILY AS DIRECTED BY  COUMADIN  CLINIC 150 tablet 1  . mirabegron ER (MYRBETRIQ) 50 MG TB24 tablet Take 50 mg by mouth daily.    . indomethacin (INDOCIN) 25 MG capsule Take 1 capsule (25 mg total) by mouth 3 (three) times daily as needed. (Patient not taking: Reported on  03/25/2016) 21 capsule 0   No facility-administered medications prior to visit.      Per HPI unless specifically indicated in ROS section below Review of Systems  Constitutional: Positive for appetite change (recent weight loss now better). Negative for activity change, chills, fatigue, fever and unexpected weight change.  HENT: Negative for hearing loss.   Eyes: Negative for visual disturbance.  Respiratory: Negative for cough, chest tightness, shortness of breath and wheezing.   Cardiovascular: Negative for chest pain, palpitations and leg swelling.  Gastrointestinal: Negative for abdominal  distention, abdominal pain, blood in stool, constipation, diarrhea, nausea and vomiting.  Genitourinary: Negative for difficulty urinating and hematuria.  Musculoskeletal: Negative for arthralgias, myalgias and neck pain.  Skin: Negative for rash.  Neurological: Negative for dizziness, seizures, syncope and headaches.  Hematological: Negative for adenopathy. Does not bruise/bleed easily.  Psychiatric/Behavioral: Negative for dysphoric mood. The patient is not nervous/anxious.        Objective:    BP 124/64   Pulse 70   Temp 98 F (36.7 C) (Oral)   Wt 208 lb 8 oz (94.6 kg)   SpO2 95%   BMI 32.66 kg/m   Wt Readings from Last 3 Encounters:  10/06/16 208 lb 8 oz (94.6 kg)  09/16/16 198 lb 8 oz (90 kg)  06/28/16 216 lb (98 kg)    Physical Exam  Constitutional: He is oriented to person, place, and time. He appears well-developed and well-nourished. No distress.  In wheelchair today  HENT:  Head: Normocephalic and atraumatic.  Right Ear: Hearing, tympanic membrane, external ear and ear canal normal.  Left Ear: Hearing, tympanic membrane, external ear and ear canal normal.  Nose: Nose normal.  Mouth/Throat: Uvula is midline, oropharynx is clear and moist and mucous membranes are normal. No oropharyngeal exudate, posterior oropharyngeal edema or posterior oropharyngeal erythema.  Eyes: Pupils are equal, round, and reactive to light. Conjunctivae and EOM are normal. No scleral icterus.  Neck: Normal range of motion. Neck supple.  Cardiovascular: Normal rate and intact distal pulses.  An irregularly irregular rhythm present.  Murmur (3/6 systolic) heard. Pulses:      Radial pulses are 2+ on the right side, and 2+ on the left side.  Pulmonary/Chest: Effort normal and breath sounds normal. No respiratory distress. He has no wheezes. He has no rales.  Abdominal: Soft. Bowel sounds are normal. He exhibits no distension and no mass. There is no tenderness. There is no rebound and no  guarding.  Musculoskeletal: Normal range of motion. He exhibits edema (tr).  Lymphadenopathy:    He has no cervical adenopathy.  Neurological: He is alert and oriented to person, place, and time.  CN grossly intact, station and gait intact  Skin: Skin is warm and dry. No rash noted.     L sacral ulcer 5cm long by 1.5cm wide at base Clean base  Psychiatric: He has a normal mood and affect. His behavior is normal. Judgment and thought content normal.  Nursing note and vitals reviewed.  Results for orders placed or performed in visit on 09/29/16  POCT INR  Result Value Ref Range   INR 2.4       Assessment & Plan:   Problem List Items Addressed This Visit    Advanced care planning/counseling discussion    Advanced planning - working on finalizing this. HCPOA is niece Ashley Murrain. No prolonged life support. Wants to be DNR. Form filled out today.       Aphasia, post-stroke   Chronic diastolic  congestive heart failure (HCC)    Seems euvolemic today      Cognitive impairment    Stroke related. Wife and niece deny any cognitive changes or different level of care needed.       DNR (do not resuscitate)    Form updated today.       Essential hypertension    Chronic, stable. Continue current regimen.       Gout    Stable period on low dose allopurinol.      Health maintenance examination - Primary    Preventative protocols reviewed and updated unless pt declined. Discussed healthy diet and lifestyle.       Hypercalcemia    Will recheck at 3 mo f/u visit.      Relevant Orders   VITAMIN D 25 Hydroxy (Vit-D Deficiency, Fractures)   CBC with Differential/Platelet   PTH, Intact and Calcium   Hypercholesterolemia    Chronic, stable. Continue current regimen of pravastatin.       Lesion of skin of cheek    Did not see derm. Improving. Wife states she removed piece of wood from lesion and it has been improving ever since.      Permanent atrial fibrillation (HCC)     Continue coumadin followed by cardiology clinic.      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 Doesn't know name of salve she is using. No signs of secondary infection today. I did recommend continue home care and suggested 3 mo f/u in office for recheck to ensure healing well.       Urinary incontinence    It seems myrbetriq caused urinary retention - now off this med. He does have indwelling foley catheter and did recently complete treatment for UTI with cipro course. He has f/u scheduled with urology for next month.           Follow up plan: Return in about 3 months (around 01/06/2017) for follow up visit.  Eustaquio Boyden, MD

## 2016-10-06 NOTE — Assessment & Plan Note (Signed)
Seems euvolemic today.  

## 2016-10-06 NOTE — Assessment & Plan Note (Signed)
Did not see derm. Improving. Wife states she removed piece of wood from lesion and it has been improving ever since.

## 2016-10-06 NOTE — Assessment & Plan Note (Signed)
It seems myrbetriq caused urinary retention - now off this med. He does have indwelling foley catheter and did recently complete treatment for UTI with cipro course. He has f/u scheduled with urology for next month.

## 2016-10-06 NOTE — Assessment & Plan Note (Signed)
Preventative protocols reviewed and updated unless pt declined. Discussed healthy diet and lifestyle.  

## 2016-10-06 NOTE — Assessment & Plan Note (Signed)
Chronic, stable. Continue current regimen. 

## 2016-10-06 NOTE — Assessment & Plan Note (Signed)
Chronic, stable. Continue current regimen of pravastatin.

## 2016-10-25 DIAGNOSIS — R338 Other retention of urine: Secondary | ICD-10-CM | POA: Diagnosis not present

## 2016-10-25 DIAGNOSIS — N401 Enlarged prostate with lower urinary tract symptoms: Secondary | ICD-10-CM | POA: Diagnosis not present

## 2016-10-31 ENCOUNTER — Encounter (HOSPITAL_COMMUNITY): Payer: Self-pay | Admitting: Emergency Medicine

## 2016-10-31 ENCOUNTER — Emergency Department (HOSPITAL_COMMUNITY)
Admission: EM | Admit: 2016-10-31 | Discharge: 2016-11-01 | Disposition: A | Discharge status: Home / Self Care | Payer: Medicare Other | Attending: Emergency Medicine | Admitting: Emergency Medicine

## 2016-10-31 DIAGNOSIS — Z66 Do not resuscitate: Secondary | ICD-10-CM | POA: Diagnosis not present

## 2016-10-31 DIAGNOSIS — I5032 Chronic diastolic (congestive) heart failure: Secondary | ICD-10-CM | POA: Diagnosis not present

## 2016-10-31 DIAGNOSIS — Z79899 Other long term (current) drug therapy: Secondary | ICD-10-CM | POA: Insufficient documentation

## 2016-10-31 DIAGNOSIS — Z7901 Long term (current) use of anticoagulants: Secondary | ICD-10-CM | POA: Insufficient documentation

## 2016-10-31 DIAGNOSIS — N401 Enlarged prostate with lower urinary tract symptoms: Secondary | ICD-10-CM | POA: Diagnosis not present

## 2016-10-31 DIAGNOSIS — Y829 Unspecified medical devices associated with adverse incidents: Secondary | ICD-10-CM | POA: Diagnosis not present

## 2016-10-31 DIAGNOSIS — Z87891 Personal history of nicotine dependence: Secondary | ICD-10-CM | POA: Insufficient documentation

## 2016-10-31 DIAGNOSIS — Z8673 Personal history of transient ischemic attack (TIA), and cerebral infarction without residual deficits: Secondary | ICD-10-CM | POA: Insufficient documentation

## 2016-10-31 DIAGNOSIS — I2510 Atherosclerotic heart disease of native coronary artery without angina pectoris: Secondary | ICD-10-CM | POA: Insufficient documentation

## 2016-10-31 DIAGNOSIS — Z7982 Long term (current) use of aspirin: Secondary | ICD-10-CM | POA: Diagnosis not present

## 2016-10-31 DIAGNOSIS — T83511A Infection and inflammatory reaction due to indwelling urethral catheter, initial encounter: Secondary | ICD-10-CM | POA: Insufficient documentation

## 2016-10-31 DIAGNOSIS — N390 Urinary tract infection, site not specified: Secondary | ICD-10-CM

## 2016-10-31 DIAGNOSIS — R339 Retention of urine, unspecified: Secondary | ICD-10-CM | POA: Diagnosis present

## 2016-10-31 DIAGNOSIS — T83518A Infection and inflammatory reaction due to other urinary catheter, initial encounter: Secondary | ICD-10-CM | POA: Diagnosis not present

## 2016-10-31 DIAGNOSIS — T839XXA Unspecified complication of genitourinary prosthetic device, implant and graft, initial encounter: Secondary | ICD-10-CM

## 2016-10-31 DIAGNOSIS — I110 Hypertensive heart disease with heart failure: Secondary | ICD-10-CM | POA: Insufficient documentation

## 2016-10-31 DIAGNOSIS — T83098A Other mechanical complication of other indwelling urethral catheter, initial encounter: Secondary | ICD-10-CM | POA: Diagnosis not present

## 2016-10-31 LAB — I-STAT CG4 LACTIC ACID, ED: LACTIC ACID, VENOUS: 1.81 mmol/L (ref 0.5–1.9)

## 2016-10-31 NOTE — ED Triage Notes (Signed)
Pt coming from home with complaint of urinary retention. Pt has foley catheter which is changed regularly. Pt had foley changed 2 days ago and is having difficulty urinating. Noticeable discharge present on penis. Urine in bag is cloudy and has foul odor. Pt A&O x4.

## 2016-10-31 NOTE — ED Notes (Signed)
Bladder scan: 574 

## 2016-11-01 DIAGNOSIS — R338 Other retention of urine: Secondary | ICD-10-CM | POA: Diagnosis not present

## 2016-11-01 LAB — CBC WITH DIFFERENTIAL/PLATELET
BASOS PCT: 0 %
Basophils Absolute: 0 10*3/uL (ref 0.0–0.1)
EOS ABS: 0.1 10*3/uL (ref 0.0–0.7)
Eosinophils Relative: 1 %
HEMATOCRIT: 38.4 % — AB (ref 39.0–52.0)
Hemoglobin: 13.3 g/dL (ref 13.0–17.0)
Lymphocytes Relative: 13 %
Lymphs Abs: 1.9 10*3/uL (ref 0.7–4.0)
MCH: 31.9 pg (ref 26.0–34.0)
MCHC: 34.6 g/dL (ref 30.0–36.0)
MCV: 92.1 fL (ref 78.0–100.0)
MONO ABS: 1.1 10*3/uL — AB (ref 0.1–1.0)
MONOS PCT: 8 %
Neutro Abs: 11.5 10*3/uL — ABNORMAL HIGH (ref 1.7–7.7)
Neutrophils Relative %: 78 %
Platelets: 280 10*3/uL (ref 150–400)
RBC: 4.17 MIL/uL — ABNORMAL LOW (ref 4.22–5.81)
RDW: 15.7 % — AB (ref 11.5–15.5)
WBC: 14.7 10*3/uL — ABNORMAL HIGH (ref 4.0–10.5)

## 2016-11-01 LAB — URINALYSIS, ROUTINE W REFLEX MICROSCOPIC
BILIRUBIN URINE: NEGATIVE
GLUCOSE, UA: NEGATIVE mg/dL
KETONES UR: NEGATIVE mg/dL
Nitrite: NEGATIVE
PROTEIN: 30 mg/dL — AB
Specific Gravity, Urine: 1.015 (ref 1.005–1.030)
pH: 7 (ref 5.0–8.0)

## 2016-11-01 LAB — BASIC METABOLIC PANEL
Anion gap: 13 (ref 5–15)
BUN: 30 mg/dL — AB (ref 6–20)
CALCIUM: 10.5 mg/dL — AB (ref 8.9–10.3)
CO2: 21 mmol/L — AB (ref 22–32)
CREATININE: 1.27 mg/dL — AB (ref 0.61–1.24)
Chloride: 102 mmol/L (ref 101–111)
GFR calc non Af Amer: 48 mL/min — ABNORMAL LOW (ref >60)
GFR, EST AFRICAN AMERICAN: 56 mL/min — AB (ref >60)
Glucose, Bld: 149 mg/dL — ABNORMAL HIGH (ref 65–99)
Potassium: 3.4 mmol/L — ABNORMAL LOW (ref 3.5–5.1)
SODIUM: 136 mmol/L (ref 135–145)

## 2016-11-01 LAB — URINALYSIS, MICROSCOPIC (REFLEX)

## 2016-11-01 MED ORDER — LEVOFLOXACIN IN D5W 750 MG/150ML IV SOLN
750.0000 mg | Freq: Once | INTRAVENOUS | Status: AC
  Administered 2016-11-01: 750 mg via INTRAVENOUS
  Filled 2016-11-01: qty 150

## 2016-11-01 MED ORDER — LEVOFLOXACIN 750 MG PO TABS: 750.0000 mg | ORAL_TABLET | Freq: Every day | ORAL | 0 refills | Status: DC

## 2016-11-01 NOTE — Discharge Instructions (Signed)
Follow-up with your urologist in the next 1-2 days.  Levaquin as prescribed.

## 2016-11-01 NOTE — ED Provider Notes (Signed)
[facility]-EMERGENCY DEPT Provider Note   CSN: [id number] Arrival date & time: 10/31/16  2307     History   Chief Complaint Chief Complaint  Patient presents with  . Urinary Retention    HPI [name] is a 83 y.o. male.  Patient is an 83 year old male with past medical history of CHF, A. Fib, prior CVA, and enlarged prostate. He recently had a Foley catheter placed for inability to void. This evening the catheter became very uncomfortable and he felt as though was not emptying. He felt distention in his lower abdomen and presents for evaluation of this. Denies any fevers or chills.   The history is provided by the patient.    Past Medical History:  Diagnosis Date  . Atrial fibrillation (HCC)    chronic  . CAD (coronary artery disease)    s/p MI, unsure when  . CHF (congestive heart failure) (HCC)   . Cognitive impairment    from stroke  . 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 DOE (dyspnea on exertion)   . Gout   . HTN (hypertension)   . Hyperlipidemia     Patient Active Problem List   Diagnosis Date Noted  . Health maintenance examination 10/06/2016  . Hypercalcemia 10/06/2016  . Sacral pressure sore 10/06/2016  . Urinary incontinence 10/06/2016  . Tinea pedis 03/26/2016  . Medicare annual wellness visit, subsequent 10/31/2014  . Advanced care planning/counseling discussion 10/31/2014  . DNR (do not resuscitate) 10/31/2014  . Dyspnea on exertion 09/03/2013  . Encounter for therapeutic drug monitoring 05/04/2013  . Gout 09/01/2012  . Lesion of skin of cheek 06/23/2012  . Cognitive impairment   . Permanent atrial fibrillation (HCC) 06/08/2010  . Long term (current) use of anticoagulants 06/08/2010  . Right bundle branch block 06/08/2010  . Aphasia, post-stroke 06/08/2010  . Aortic stenosis, mild 06/08/2010  . Chronic diastolic congestive heart failure (HCC) 06/08/2010  . Hypercholesterolemia  06/08/2010  . BPH (benign prostatic hyperplasia) 06/08/2010  . Essential hypertension 06/08/2010    Past Surgical History:  Procedure Laterality Date  . APPENDECTOMY  1994  . CARDIOVASCULAR STRESS TEST  01/11/2008   evidence of an old scar of the septum and a larger apical scar but no reversible ischemia, EF 48%  . CATARACT EXTRACTION Left 2008   Epps  . EXCISION MORTON'S NEUROMA  1995   x 2  . HEMORRHOID SURGERY    . TONSILLECTOMY  1964  . US ECHOCARDIOGRAPHY  12/19/2007   EF 55-60%, mild aortic stenosis and mild pulmonary hypertension and mild mitral regurgitation       Home Medications    Prior to Admission medications   Medication Sig Start Date End Date Taking? Authorizing Provider  allopurinol (ZYLOPRIM) 100 MG tablet Take 1 tablet (100 mg total) by mouth daily. 03/25/16  Yes Eustaquio Boyden, MD  aspirin EC 81 MG tablet Take 81 mg by mouth daily.   Yes [provider]  digoxin (LANOXIN) 0.125 MG tablet Take 1 tablet (125 mcg total) by mouth every other day. 12/22/15  Yes Nahser, Deloris Ping, MD  finasteride (PROSCAR) 5 MG tablet Take 5 mg by mouth daily.   Yes [provider]  furosemide (LASIX) 40 MG tablet Take 2 tablets (80 mg) by mouth in the morning Patient taking differently: Take 80 mg by mouth daily. Take 2 tablets (80 mg) by mouth in the morning 12/22/15  Yes Nahser, Deloris Ping, MD  lisinopril (PRINIVIL,ZESTRIL) 20  MG tablet Take 20 mg by mouth every evening.   Yes [provider]  lisinopril-hydrochlorothiazide (PRINZIDE,ZESTORETIC) 20-12.5 MG tablet Take 1 tablet by mouth daily with breakfast.    Yes [provider]  metoprolol succinate (TOPROL-XL) 25 MG 24 hr tablet Take 0.5 tablet (12.5 mg) by mouth daily   Yes [provider]  Multiple Vitamin (MULTIVITAMIN WITH MINERALS) TABS tablet Take 1 tablet by mouth daily.   Yes [provider]  Multiple Vitamins-Minerals (EYE VITAMINS) CAPS Take 2 capsules by mouth  daily.   Yes [provider]  nitroGLYCERIN (NITROSTAT) 0.4 MG SL tablet Place 0.4 mg under the tongue every 5 (five) minutes as needed (for chest pain).    Yes [provider]  Omega-3 Fatty Acids (FISH OIL) 1000 MG CAPS Take 1,000 mg by mouth daily.    Yes [provider]  pravastatin (PRAVACHOL) 40 MG tablet Take 0.5 tablet (20 mg) by mouth daily   Yes [provider]  Red Yeast Rice Extract (RED YEAST RICE PO) Take 2 tablets by mouth daily.   Yes [provider]  warfarin (COUMADIN) 5 MG tablet Take 7.5-10 mg by mouth daily. Takes 7.5mg  every night except  on Wednesday's   Yes [provider]  levofloxacin (LEVAQUIN) 750 MG tablet Take 1 tablet (750 mg total) by mouth daily. X 7 days 11/01/16   Geoffery Lyons, MD  warfarin (COUMADIN) 5 MG tablet TAKE ONE TABLET BY MOUTH ONCE DAILY AS DIRECTED BY  COUMADIN  CLINIC Patient not taking: Reported on 11/01/2016 08/23/16   Nahser, Deloris Ping, MD    Family History Family History  Problem Relation Age of Onset  . Cancer Brother        brain  . Cancer Sister        colon  . CAD Sister   . Alcohol abuse Brother     Social History Social History  Substance Use Topics  . Smoking status: Former Smoker    Quit date: 02/08/1950  . Smokeless tobacco: Never Used  . Alcohol use No     Allergies   Benazepril hcl   Review of Systems Review of Systems  All other systems reviewed and are negative.    Physical Exam Updated Vital Signs BP 97/79 (BP Location: Left Arm)   Pulse 68   Resp 16   SpO2 97%   Physical Exam  Constitutional: He is oriented to person, place, and time. He appears well-developed and well-nourished. No distress.  HENT:  Head: Normocephalic and atraumatic.  Mouth/Throat: Oropharynx is clear and moist.  Neck: Normal range of motion. Neck supple.  Cardiovascular: Normal rate and regular rhythm.  Exam reveals no friction rub.   No murmur heard. Pulmonary/Chest:  Effort normal and breath sounds normal. No respiratory distress. He has no wheezes. He has no rales.  Abdominal: Soft. Bowel sounds are normal. He exhibits no distension. There is no tenderness.  There is suprapubic fullness to palpation.  Musculoskeletal: Normal range of motion. He exhibits no edema.  Neurological: He is alert and oriented to person, place, and time. Coordination normal.  Skin: Skin is warm and dry. He is not diaphoretic.  Nursing note and vitals reviewed.    ED Treatments / Results  Labs (all labs ordered are listed, but only abnormal results are displayed) Labs Reviewed  CBC WITH DIFFERENTIAL/PLATELET - Abnormal; Notable for the following:       Result Value   WBC 14.7 (*)    RBC 4.17 (*)  HCT 38.4 (*)    RDW 15.7 (*)    Neutro Abs 11.5 (*)    Monocytes Absolute 1.1 (*)    All other components within normal limits  BASIC METABOLIC PANEL - Abnormal; Notable for the following:    Potassium 3.4 (*)    CO2 21 (*)    Glucose, Bld 149 (*)    BUN 30 (*)    Creatinine, Ser 1.27 (*)    Calcium 10.5 (*)    GFR calc non Af Amer 48 (*)    GFR calc Af Amer 56 (*)    All other components within normal limits  URINALYSIS, ROUTINE W REFLEX MICROSCOPIC - Abnormal; Notable for the following:    APPearance HAZY (*)    Hgb urine dipstick LARGE (*)    Protein, ur 30 (*)    Leukocytes, UA LARGE (*)    All other components within normal limits  URINALYSIS, MICROSCOPIC (REFLEX) - Abnormal; Notable for the following:    Bacteria, UA FEW (*)    Squamous Epithelial / LPF 0-5 (*)    All other components within normal limits  URINE CULTURE  I-STAT CG4 LACTIC ACID, ED    EKG  EKG Interpretation None       Radiology No results found.  Procedures Procedures (including critical care time)  Medications Ordered in ED Medications  levofloxacin (LEVAQUIN) IVPB 750 mg (0 mg Intravenous Stopped 11/01/16 0322)     Initial Impression / Assessment and Plan / ED Course  I  have reviewed the triage vital signs and the nursing notes.  Pertinent labs & imaging results that were available during my care of the patient were reviewed by me and considered in my medical decision making (see chart for details).  The existing Foley catheter was removed and replaced with resolution of his symptoms. The urinalysis was suggestive of a UTI. He was given Levaquin and will be discharged with the same. He is due to follow-up with his urologist this afternoon and I have advised him to keep this appointment.  Final Clinical Impressions(s) / ED Diagnoses   Final diagnoses:  Problem with Foley catheter, initial encounter [facility])  Urinary tract infection associated with indwelling urethral catheter, initial encounter [facility])    New Prescriptions Discharge Medication List as of 11/01/2016  2:51 AM    START taking these medications   Details  levofloxacin (LEVAQUIN) 750 MG tablet Take 1 tablet (750 mg total) by mouth daily. X 7 days, Starting Mon 11/01/2016, Print         Geoffery Lyons, MD 11/01/16 [phone]

## 2016-11-03 LAB — URINE CULTURE

## 2016-11-08 DIAGNOSIS — R339 Retention of urine, unspecified: Secondary | ICD-10-CM | POA: Diagnosis not present

## 2016-11-08 DIAGNOSIS — R338 Other retention of urine: Secondary | ICD-10-CM | POA: Diagnosis not present

## 2016-11-10 ENCOUNTER — Ambulatory Visit (INDEPENDENT_AMBULATORY_CARE_PROVIDER_SITE_OTHER): Payer: Medicare Other

## 2016-11-10 DIAGNOSIS — I4891 Unspecified atrial fibrillation: Secondary | ICD-10-CM | POA: Diagnosis not present

## 2016-11-10 DIAGNOSIS — Z7901 Long term (current) use of anticoagulants: Secondary | ICD-10-CM

## 2016-11-10 DIAGNOSIS — I4821 Permanent atrial fibrillation: Secondary | ICD-10-CM

## 2016-11-10 DIAGNOSIS — Z5181 Encounter for therapeutic drug level monitoring: Secondary | ICD-10-CM

## 2016-11-10 DIAGNOSIS — I482 Chronic atrial fibrillation: Secondary | ICD-10-CM

## 2016-11-10 LAB — POCT INR: INR: 2

## 2016-11-15 DIAGNOSIS — R338 Other retention of urine: Secondary | ICD-10-CM | POA: Diagnosis not present

## 2016-11-15 DIAGNOSIS — N401 Enlarged prostate with lower urinary tract symptoms: Secondary | ICD-10-CM | POA: Diagnosis not present

## 2016-12-03 DIAGNOSIS — R338 Other retention of urine: Secondary | ICD-10-CM | POA: Diagnosis not present

## 2016-12-17 ENCOUNTER — Encounter: Payer: Self-pay | Admitting: Cardiovascular Disease

## 2016-12-28 ENCOUNTER — Encounter: Payer: Self-pay | Admitting: Cardiovascular Disease

## 2016-12-28 ENCOUNTER — Ambulatory Visit: Payer: Medicare Other | Admitting: Cardiovascular Disease

## 2016-12-28 ENCOUNTER — Encounter (INDEPENDENT_AMBULATORY_CARE_PROVIDER_SITE_OTHER): Payer: Self-pay

## 2016-12-28 ENCOUNTER — Ambulatory Visit (INDEPENDENT_AMBULATORY_CARE_PROVIDER_SITE_OTHER): Payer: Medicare Other | Admitting: *Deleted

## 2016-12-28 VITALS — BP 144/58 | HR 62 | Ht 67.0 in

## 2016-12-28 DIAGNOSIS — Z5181 Encounter for therapeutic drug level monitoring: Secondary | ICD-10-CM

## 2016-12-28 DIAGNOSIS — I482 Chronic atrial fibrillation, unspecified: Secondary | ICD-10-CM

## 2016-12-28 DIAGNOSIS — I4821 Permanent atrial fibrillation: Secondary | ICD-10-CM

## 2016-12-28 DIAGNOSIS — I5032 Chronic diastolic (congestive) heart failure: Secondary | ICD-10-CM

## 2016-12-28 DIAGNOSIS — I4891 Unspecified atrial fibrillation: Secondary | ICD-10-CM | POA: Diagnosis not present

## 2016-12-28 DIAGNOSIS — Z7901 Long term (current) use of anticoagulants: Secondary | ICD-10-CM | POA: Diagnosis not present

## 2016-12-28 LAB — POCT INR: INR: 2.2

## 2016-12-28 NOTE — Patient Instructions (Signed)
Continue  taking 1.5 tablets every day except 2 tablets only on Wednesdays. Recheck in 8 weeks

## 2016-12-28 NOTE — Progress Notes (Signed)
Cardiology Office Note   Date:  12/28/2016   ID:  [name], [name] 1925/05/09, MRN [id number]  PCP:  Eustaquio Boyden, MD  Cardiologist: Cassell Clement MD - , now Beau Vanduzer   Chief Complaint  Patient presents with  . Atrial Fibrillation   Problem List 1. Chronic atrial fibrillation. 2. remote embolic stroke with expressive aphasia 3. history of aortic stenosis 4. hypertensive heart disease without heart failure 5. Hypercholesterolemia  6. chronic diastolic heart failure    Previous notes from Dr. Harlow Asa [name] is a 83 y.o. male  This pleasant 83 year old gentleman is seen for a four-month followup office visit. He has a history of established chronic atrial fibrillation. 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 He also has a history of chronic diastolic congestive heart failure. He has not had a cardiac catheterization. He does not have any history of ischemic heart disease. However an adenosine Cardiolite stress test in December 2009 showed evidence of an old scar of the septum and a larger apical scar but no reversible ischemia. The patient had an echocardiogram in November 2009 showing an ejection fraction 55-60% with mild aortic stenosis and mild pulmonary hypertension and mild mitral regurgitation. The patient is on long-term Coumadin for his chronic atrial fibrillation.  The patient continues to have exertional dyspnea which is stable. He has not been having any peripheral edema. He is having more of a problem with bladder urgency. He is no longer able to attend church.  Nov. 13, 2017:    Initial visit with [name]  - transfer from [place].  Seen with his wife, Ivan.  Has a hx of chronic A-Fib , mild pulmonary HTN No CP or dyspnea. Has gout issues Has some swelling in his legs - R>L    Jun 28, 2016:  Doing well.   Having some issues with incontenence - is on Lasix. Had   incontinence today on  his way into the office I suggested that he hold it when he has an appt. .  BP looks good Breathing is good   Nov. 20, 2018:  Seen for chronic Afib ,  Mild pulmonary HTN  Has lost about 40 lbs this year   Past Medical History:  Diagnosis Date  . Atrial fibrillation (HCC)    chronic  . CAD (coronary artery disease)    s/p MI, unsure when  . CHF (congestive heart failure) (HCC)   . Cognitive impairment    from stroke  . 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 DOE (dyspnea on exertion)   . Gout   . HTN (hypertension)   . Hyperlipidemia     Past Surgical History:  Procedure Laterality Date  . APPENDECTOMY  1994  . CARDIOVASCULAR STRESS TEST  01/11/2008   evidence of an old scar of the septum and a larger apical scar but no reversible ischemia, EF 48%  . CATARACT EXTRACTION Left 2008   Epps  . EXCISION MORTON'S NEUROMA  1995   x 2  . HEMORRHOID SURGERY    . TONSILLECTOMY  1964  . US ECHOCARDIOGRAPHY  12/19/2007   EF 55-60%, mild aortic stenosis and mild pulmonary hypertension and mild mitral regurgitation     Current Outpatient Medications  Medication Sig Dispense Refill  . allopurinol (ZYLOPRIM) 100 MG tablet Take 1 tablet (100 mg total) by mouth daily. 90 tablet 3  . aspirin EC 81 MG  tablet Take 81 mg by mouth daily.    . digoxin (LANOXIN) 0.125 MG tablet Take 1 tablet (125 mcg total) by mouth every other day. 45 tablet 3  . finasteride (PROSCAR) 5 MG tablet Take 5 mg by mouth daily.    . furosemide (LASIX) 40 MG tablet Take 2 tablets (80 mg) by mouth in the morning 180 tablet 3  . levofloxacin (LEVAQUIN) 750 MG tablet Take 1 tablet (750 mg total) by mouth daily. X 7 days 7 tablet 0  . lisinopril (PRINIVIL,ZESTRIL) 20 MG tablet Take 20 mg by mouth every evening.    Marland Kitchen. lisinopril-hydrochlorothiazide (PRINZIDE,ZESTORETIC) 20-12.5 MG tablet Take 1 tablet by mouth daily with breakfast.     . metoprolol succinate  (TOPROL-XL) 25 MG 24 hr tablet Take 0.5 tablet (12.5 mg) by mouth daily    . Multiple Vitamin (MULTIVITAMIN WITH MINERALS) TABS tablet Take 1 tablet by mouth daily.    . Multiple Vitamins-Minerals (EYE VITAMINS) CAPS Take 2 capsules by mouth daily.    . nitroGLYCERIN (NITROSTAT) 0.4 MG SL tablet Place 0.4 mg under the tongue every 5 (five) minutes as needed (for chest pain).     . Omega-3 Fatty Acids (FISH OIL) 1000 MG CAPS Take 1,000 mg by mouth daily.     . pravastatin (PRAVACHOL) 40 MG tablet Take 0.5 tablet (20 mg) by mouth daily    . Red Yeast Rice Extract (RED YEAST RICE PO) Take 2 tablets by mouth daily.    Marland Kitchen. warfarin (COUMADIN) 5 MG tablet TAKE ONE TABLET BY MOUTH ONCE DAILY AS DIRECTED BY  COUMADIN  CLINIC 150 tablet 1  . warfarin (COUMADIN) 5 MG tablet Take 7.5-10 mg by mouth daily. Takes 7.5mg  every night except 10mg  on Wednesday's     No current facility-administered medications for this visit.     Allergies:   Benazepril hcl    Social History:  The patient  reports that he quit smoking about 66 years ago. he has never used smokeless tobacco. He reports that he does not drink alcohol or use drugs.   Family History:  The patient's family history includes Alcohol abuse in his brother; CAD in his sister; Cancer in his brother and sister.    ROS:  Please see the history of present illness.   Otherwise, review of systems are positive for none.   All other systems are reviewed and negative.   Marland Kitchen.Physical Exam: Blood pressure (!) 144/58, pulse 62, height 5\' 7"  (1.702 m), SpO2 98 %.  GEN:  Well nourished, well developed in no acute distress HEENT: Normal NECK: No JVD; No carotid bruits LYMPHATICS: No lymphadenopathy CARDIAC: Irregularly irregular.  He has a soft systolic murmur.  Rubs, gallops RESPIRATORY:  Clear to auscultation without rales, wheezing or rhonchi  ABDOMEN: Soft, non-tender, non-distended MUSCULOSKELETAL:  No edema; No deformity  SKIN: Warm and dry NEUROLOGIC:   Alert and oriented x 3   EKG:       Recent Labs: 09/07/2016: ALT 45 10/31/2016: BUN 30; Creatinine, Ser 1.27; Hemoglobin 13.3; Platelets 280; Potassium 3.4; Sodium 136    Lipid Panel    Component Value Date/Time   CHOL 103 09/16/2016 1106   TRIG 55.0 09/16/2016 1106   HDL 40.00 09/16/2016 1106   CHOLHDL 3 09/16/2016 1106   VLDL 11.0 09/16/2016 1106   LDLCALC 52 09/16/2016 1106      Wt Readings from Last 3 Encounters:  10/06/16 208 lb 8 oz (94.6 kg)  09/16/16 198 lb 8 oz (90 kg)  06/28/16 216 lb (98 kg)        ASSESSMENT AND PLAN:  1. Chronic atrial fibrillation:   He has chronic atrial fibrillation.  He seems to tolerate it very well.  He has heart rate is well controlled.  Continue Coumadin.  2. remote embolic stroke with expressive aphasia.  Continue Coumadin for his atrial fibrillation.  3. history of aortic stenosis.  Mild.  No symptoms or signs of congestive heart failure  4. hypertensive heart disease   5. Hypercholesterolemia:     6. chronic diastolic heart failure -he is basically wheelchair-bound for the most part.  He does not complain of any shortness of breath.   Current medicines are reviewed at length with the patient today.  The patient does not have concerns regarding medicines.  The following changes have been made:  no change  Labs/ tests ordered today include:   No orders of the defined types were placed in this encounter.    Kristeen MissPhilip Gracy Ehly, MD  12/28/2016 4:19 PM    [facility] [address],  [address] [place], [place]  [address] Pager [phone] Phone: [phone]; Fax: [phone]

## 2016-12-28 NOTE — Patient Instructions (Signed)

## 2017-01-03 ENCOUNTER — Other Ambulatory Visit: Payer: Self-pay | Admitting: Cardiovascular Disease

## 2017-01-04 DIAGNOSIS — R3914 Feeling of incomplete bladder emptying: Secondary | ICD-10-CM | POA: Diagnosis not present

## 2017-01-06 ENCOUNTER — Other Ambulatory Visit: Payer: Self-pay | Admitting: Cardiovascular Disease

## 2017-01-07 ENCOUNTER — Ambulatory Visit: Payer: Medicare Other | Admitting: Family Medicine

## 2017-01-24 ENCOUNTER — Other Ambulatory Visit: Payer: Self-pay | Admitting: Cardiovascular Disease

## 2017-01-30 ENCOUNTER — Other Ambulatory Visit: Payer: Self-pay | Admitting: Cardiovascular Disease

## 2017-02-03 DIAGNOSIS — R338 Other retention of urine: Secondary | ICD-10-CM | POA: Diagnosis not present

## 2017-02-21 ENCOUNTER — Encounter: Payer: Self-pay | Admitting: Family Medicine

## 2017-02-21 ENCOUNTER — Ambulatory Visit: Payer: Medicare Other | Admitting: Family Medicine

## 2017-02-21 VITALS — BP 122/64 | HR 56 | Temp 98.2°F | Wt 216.5 lb

## 2017-02-21 DIAGNOSIS — L989 Disorder of the skin and subcutaneous tissue, unspecified: Secondary | ICD-10-CM

## 2017-02-21 DIAGNOSIS — L89152 Pressure ulcer of sacral region, stage 2: Secondary | ICD-10-CM

## 2017-02-21 DIAGNOSIS — R32 Unspecified urinary incontinence: Secondary | ICD-10-CM | POA: Diagnosis not present

## 2017-02-21 DIAGNOSIS — E7800 Pure hypercholesterolemia, unspecified: Secondary | ICD-10-CM

## 2017-02-21 DIAGNOSIS — I69320 Aphasia following cerebral infarction: Secondary | ICD-10-CM

## 2017-02-21 LAB — RENAL FUNCTION PANEL
Albumin: 4 g/dL (ref 3.5–5.2)
BUN: 27 mg/dL — ABNORMAL HIGH (ref 6–23)
CO2: 29 meq/L (ref 19–32)
CREATININE: 1.57 mg/dL — AB (ref 0.40–1.50)
Calcium: 10.3 mg/dL (ref 8.4–10.5)
Chloride: 103 mEq/L (ref 96–112)
GFR: 44.39 mL/min — AB (ref >60.00)
Glucose, Bld: 110 mg/dL — ABNORMAL HIGH (ref 70–99)
PHOSPHORUS: 2.7 mg/dL (ref 2.3–4.6)
Potassium: 4 mEq/L (ref 3.5–5.1)
Sodium: 140 mEq/L (ref 135–145)

## 2017-02-21 LAB — CBC WITH DIFFERENTIAL/PLATELET
BASOS ABS: 0.1 10*3/uL (ref 0.0–0.1)
Basophils Relative: 0.7 % (ref 0.0–3.0)
Eosinophils Absolute: 0.2 10*3/uL (ref 0.0–0.7)
Eosinophils Relative: 2.4 % (ref 0.0–5.0)
HCT: 41.1 % (ref 39.0–52.0)
Hemoglobin: 13.7 g/dL (ref 13.0–17.0)
LYMPHS ABS: 1.6 10*3/uL (ref 0.7–4.0)
Lymphocytes Relative: 19.5 % (ref 12.0–46.0)
MCHC: 33.3 g/dL (ref 30.0–36.0)
MCV: 93.5 fl (ref 78.0–100.0)
MONOS PCT: 9.7 % (ref 3.0–12.0)
Monocytes Absolute: 0.8 10*3/uL (ref 0.1–1.0)
NEUTROS ABS: 5.4 10*3/uL (ref 1.4–7.7)
NEUTROS PCT: 67.7 % (ref 43.0–77.0)
PLATELETS: 195 10*3/uL (ref 150.0–400.0)
RBC: 4.4 Mil/uL (ref 4.22–5.81)
RDW: 15.4 % (ref 11.5–15.5)
WBC: 8 10*3/uL (ref 4.0–10.5)

## 2017-02-21 LAB — VITAMIN D 25 HYDROXY (VIT D DEFICIENCY, FRACTURES): VITD: 61.95 ng/mL (ref 30.00–100.00)

## 2017-02-21 MED ORDER — ALLOPURINOL 100 MG PO TABS: 100.0000 mg | ORAL_TABLET | Freq: Every day | ORAL | 3 refills | Status: AC

## 2017-02-21 NOTE — Assessment & Plan Note (Addendum)
Foley in place after episode of urinary retention. Continues following with urology. States catheter is changed at least monthly

## 2017-02-21 NOTE — Progress Notes (Signed)
BP 122/64 (BP Location: Left Arm, Patient Position: Sitting, Cuff Size: Normal)   Pulse (!) 56   Temp 98.2 F (36.8 C) (Oral)   Wt 216 lb 8 oz (98.2 kg)   SpO2 96%   BMI 33.91 kg/m    CC:  f/u visit Subjective:    Patient ID: [name], male    DOB: 1925-07-15, 84 y.o.   MRN: [id number]  HPI: [name] is a 84 y.o. male presenting on 02/21/2017 for 3 mo follow-up   See prior note for details.  Has declined most immunizations and hearing evaluation.  Wheelchair bound. Wife cares for him.   Sacral pressure ulcer - initially treated with calcium alginate then [place] home treatment. Wife has treated this at home (she is retired Public house manager). This has healed well - no further skin opening. 1 loose bowel movement per day.   Hypercalcemia noted last labs - due for recheck.   Indwelling foley catheter - remains. Seeing urology monthly Annabell Howells).   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  Relevant past medical, surgical, family and social history reviewed and updated as indicated. Interim medical history since our last visit reviewed. Allergies and medications reviewed and updated. Outpatient Medications Prior to Visit  Medication Sig Dispense Refill  . aspirin EC 81 MG tablet Take 81 mg by mouth daily.    . digoxin (LANOXIN) 0.125 MG tablet Take 1 tablet (125 mcg total) by mouth every other day. 45 tablet 3  . finasteride (PROSCAR) 5 MG tablet Take 5 mg by mouth daily.    . furosemide (LASIX) 40 MG tablet TAKE TWO TABLETS BY MOUTH ONCE DAILY IN THE MORNING 180 tablet 3  . levofloxacin (LEVAQUIN) 750 MG tablet Take 1 tablet (750 mg total) by mouth daily. X 7 days 7 tablet 0  . lisinopril (PRINIVIL,ZESTRIL) 20 MG tablet Take 20 mg by mouth every evening.    Marland Kitchen lisinopril-hydrochlorothiazide (PRINZIDE,ZESTORETIC) 20-12.5 MG tablet TAKE ONE TABLET BY MOUTH IN THE MORNING 90 tablet 3  . metoprolol  succinate (TOPROL-XL) 25 MG 24 hr tablet TAKE ONE-HALF TABLET BY MOUTH ONCE DAILY 45 tablet 3  . Multiple Vitamin (MULTIVITAMIN WITH MINERALS) TABS tablet Take 1 tablet by mouth daily.    . Multiple Vitamins-Minerals (EYE VITAMINS) CAPS Take 2 capsules by mouth daily.    . nitroGLYCERIN (NITROSTAT) 0.4 MG SL tablet Place 0.4 mg under the tongue every 5 (five) minutes as needed (for chest pain).     . Omega-3 Fatty Acids (FISH OIL) 1000 MG CAPS Take 1,000 mg by mouth daily.     . pravastatin (PRAVACHOL) 40 MG tablet Take 0.5 tablet (20 mg) by mouth daily    . warfarin (COUMADIN) 5 MG tablet TAKE ONE TABLET BY MOUTH ONCE DAILY AS DIRECTED BY  COUMADIN  CLINIC 150 tablet 1  . warfarin (COUMADIN) 5 MG tablet Take 7.5-10 mg by mouth daily. Takes 7.5mg  every night except 10mg  on Wednesday's    . allopurinol (ZYLOPRIM) 100 MG tablet Take 1 tablet (100 mg total) by mouth daily. 90 tablet 3  . Red Yeast Rice Extract (RED YEAST RICE PO) Take 2 tablets by mouth daily.     No facility-administered medications prior to visit.      Per HPI unless specifically indicated in ROS section below Review of Systems     Objective:    BP 122/64 (BP Location: Left Arm, Patient Position: Sitting, Cuff Size:  Normal)   Pulse (!) 56   Temp 98.2 F (36.8 C) (Oral)   Wt 216 lb 8 oz (98.2 kg)   SpO2 96%   BMI 33.91 kg/m   Wt Readings from Last 3 Encounters:  02/21/17 216 lb 8 oz (98.2 kg)  10/06/16 208 lb 8 oz (94.6 kg)  09/16/16 198 lb 8 oz (90 kg)    Physical Exam  Constitutional: He appears well-developed and well-nourished. No distress.  HENT:  Head: Normocephalic and atraumatic.  Mouth/Throat: Oropharynx is clear and moist. No oropharyngeal exudate.  Verrucous growth R cheek measuring 7mm in diameter  Eyes: Conjunctivae and EOM are normal. Pupils are equal, round, and reactive to light.  Cardiovascular: Normal rate, regular rhythm and normal heart sounds.  No murmur heard. Pulmonary/Chest: Breath  sounds normal. No respiratory distress. He has no wheezes. He has no rales.  Musculoskeletal: He exhibits no edema.  Skin: Skin is warm and dry. No rash noted.  Healed sacral pressure sore with residual small erosion midline inferior buttock creas without surrounding erythema or drainage  Nursing note and vitals reviewed.  Results for orders placed or performed in visit on 02/21/17  CBC with Differential/Platelet  Result Value Ref Range   WBC 8.0 4.0 - 10.5 K/uL   RBC 4.40 4.22 - 5.81 Mil/uL   Hemoglobin 13.7 13.0 - 17.0 g/dL   HCT 16.141.1 09.639.0 - 04.552.0 %   MCV 93.5 78.0 - 100.0 fl   MCHC 33.3 30.0 - 36.0 g/dL   RDW 40.915.4 81.111.5 - 91.415.5 %   Platelets 195.0 150.0 - 400.0 K/uL   Neutrophils Relative % 67.7 43.0 - 77.0 %   Lymphocytes Relative 19.5 12.0 - 46.0 %   Monocytes Relative 9.7 3.0 - 12.0 %   Eosinophils Relative 2.4 0.0 - 5.0 %   Basophils Relative 0.7 0.0 - 3.0 %   Neutro Abs 5.4 1.4 - 7.7 K/uL   Lymphs Abs 1.6 0.7 - 4.0 K/uL   Monocytes Absolute 0.8 0.1 - 1.0 K/uL   Eosinophils Absolute 0.2 0.0 - 0.7 K/uL   Basophils Absolute 0.1 0.0 - 0.1 K/uL  VITAMIN D 25 Hydroxy (Vit-D Deficiency, Fractures)  Result Value Ref Range   VITD 61.95 30.00 - 100.00 ng/mL  Renal function panel  Result Value Ref Range   Sodium 140 135 - 145 mEq/L   Potassium 4.0 3.5 - 5.1 mEq/L   Chloride 103 96 - 112 mEq/L   CO2 29 19 - 32 mEq/L   Calcium 10.3 8.4 - 10.5 mg/dL   Albumin 4.0 3.5 - 5.2 g/dL   BUN 27 (H) 6 - 23 mg/dL   Creatinine, Ser 7.821.57 (H) 0.40 - 1.50 mg/dL   Glucose, Bld 956110 (H) 70 - 99 mg/dL   Phosphorus 2.7 2.3 - 4.6 mg/dL   GFR 21.3044.39 (L) >86.57>60.00 mL/min   Lab Results  Component Value Date   CHOL 103 09/16/2016   HDL 40.00 09/16/2016   LDLCALC 52 09/16/2016   TRIG 55.0 09/16/2016   CHOLHDL 3 09/16/2016       Assessment & Plan:   Problem List Items Addressed This Visit    Aphasia, post-stroke   Hypercalcemia    Update labs today      Relevant Orders   Renal function panel  (Completed)   Hypercholesterolemia    Chronic on 1/2 tablet pravastatin.  Reviewing med list it seems they have also been taking red yeast rice - advised stop this.       Lesion  of skin of cheek    Wart vs BCC. Advised want to r/o skin cancer. Wife feels it's getting better and declines derm referral.       Sacral pressure sore - Primary    Significant improvement. Now with small midline erosion - recommended regular barrier cream.       Urinary incontinence    Foley in place after episode of urinary retention. Continues following with urology. States catheter is changed at least monthly          Follow up plan: Return in about 8 months (around 10/22/2017) for follow up visit.  Eustaquio Boyden, MD

## 2017-02-21 NOTE — Assessment & Plan Note (Signed)
Update labs today 

## 2017-02-21 NOTE — Assessment & Plan Note (Signed)
Significant improvement. Now with small midline erosion - recommended regular barrier cream.

## 2017-02-21 NOTE — Patient Instructions (Addendum)
Labs today.  I'm glad you're doing well.  I think it's ok to stop red yeast rice if we continue pravastatin 1/2 tablet.  Continue barrier cream to sacral pressure sore - should continue to heal well.  Return in 7-8 months for wellness visit and physical.

## 2017-02-21 NOTE — Assessment & Plan Note (Signed)
Wart vs BCC. Advised want to r/o skin cancer. Wife feels it's getting better and declines derm referral.

## 2017-02-21 NOTE — Assessment & Plan Note (Signed)
Chronic on 1/2 tablet pravastatin.  Reviewing med list it seems they have also been taking red yeast rice - advised stop this.

## 2017-02-22 LAB — PTH, INTACT AND CALCIUM
Calcium: 10.6 mg/dL — ABNORMAL HIGH (ref 8.6–10.3)
PTH: 75 pg/mL — AB (ref 14–64)

## 2017-02-27 ENCOUNTER — Encounter: Payer: Self-pay | Admitting: Family Medicine

## 2017-02-27 DIAGNOSIS — N289 Disorder of kidney and ureter, unspecified: Secondary | ICD-10-CM | POA: Insufficient documentation

## 2017-02-28 ENCOUNTER — Other Ambulatory Visit: Payer: Self-pay | Admitting: Cardiovascular Disease

## 2017-03-02 ENCOUNTER — Ambulatory Visit (INDEPENDENT_AMBULATORY_CARE_PROVIDER_SITE_OTHER): Payer: Medicare Other | Admitting: *Deleted

## 2017-03-02 DIAGNOSIS — I4891 Unspecified atrial fibrillation: Secondary | ICD-10-CM

## 2017-03-02 DIAGNOSIS — I4821 Permanent atrial fibrillation: Secondary | ICD-10-CM

## 2017-03-02 DIAGNOSIS — I482 Chronic atrial fibrillation: Secondary | ICD-10-CM | POA: Diagnosis not present

## 2017-03-02 DIAGNOSIS — Z7901 Long term (current) use of anticoagulants: Secondary | ICD-10-CM

## 2017-03-02 DIAGNOSIS — Z5181 Encounter for therapeutic drug level monitoring: Secondary | ICD-10-CM | POA: Diagnosis not present

## 2017-03-02 LAB — POCT INR: INR: 2.4

## 2017-03-02 NOTE — Patient Instructions (Signed)
Description   Continue  taking 1.5 tablets every day except 2 tablets only on Wednesdays. Recheck in 6 weeks.

## 2017-03-14 DIAGNOSIS — N401 Enlarged prostate with lower urinary tract symptoms: Secondary | ICD-10-CM | POA: Diagnosis not present

## 2017-03-14 DIAGNOSIS — R338 Other retention of urine: Secondary | ICD-10-CM | POA: Diagnosis not present

## 2017-04-13 ENCOUNTER — Ambulatory Visit (INDEPENDENT_AMBULATORY_CARE_PROVIDER_SITE_OTHER): Payer: Medicare Other | Admitting: *Deleted

## 2017-04-13 DIAGNOSIS — I482 Chronic atrial fibrillation: Secondary | ICD-10-CM

## 2017-04-13 DIAGNOSIS — R338 Other retention of urine: Secondary | ICD-10-CM | POA: Diagnosis not present

## 2017-04-13 DIAGNOSIS — Z5181 Encounter for therapeutic drug level monitoring: Secondary | ICD-10-CM

## 2017-04-13 DIAGNOSIS — N401 Enlarged prostate with lower urinary tract symptoms: Secondary | ICD-10-CM | POA: Diagnosis not present

## 2017-04-13 DIAGNOSIS — I4821 Permanent atrial fibrillation: Secondary | ICD-10-CM

## 2017-04-13 DIAGNOSIS — I4891 Unspecified atrial fibrillation: Secondary | ICD-10-CM | POA: Diagnosis not present

## 2017-04-13 DIAGNOSIS — Z7901 Long term (current) use of anticoagulants: Secondary | ICD-10-CM | POA: Diagnosis not present

## 2017-04-13 LAB — POCT INR: INR: 2.5

## 2017-04-13 NOTE — Patient Instructions (Signed)
Description   Continue  taking 1.5 tablets every day except 2 tablets only on Wednesdays. Recheck in 6 weeks.

## 2017-04-14 DIAGNOSIS — R3914 Feeling of incomplete bladder emptying: Secondary | ICD-10-CM | POA: Diagnosis not present

## 2017-04-27 DIAGNOSIS — R338 Other retention of urine: Secondary | ICD-10-CM | POA: Diagnosis not present

## 2017-05-16 DIAGNOSIS — R310 Gross hematuria: Secondary | ICD-10-CM | POA: Diagnosis not present

## 2017-05-16 DIAGNOSIS — R338 Other retention of urine: Secondary | ICD-10-CM | POA: Diagnosis not present

## 2017-05-25 ENCOUNTER — Ambulatory Visit (INDEPENDENT_AMBULATORY_CARE_PROVIDER_SITE_OTHER): Payer: Medicare Other | Admitting: *Deleted

## 2017-05-25 DIAGNOSIS — I4821 Permanent atrial fibrillation: Secondary | ICD-10-CM

## 2017-05-25 DIAGNOSIS — I4891 Unspecified atrial fibrillation: Secondary | ICD-10-CM

## 2017-05-25 DIAGNOSIS — I482 Chronic atrial fibrillation: Secondary | ICD-10-CM

## 2017-05-25 DIAGNOSIS — Z7901 Long term (current) use of anticoagulants: Secondary | ICD-10-CM | POA: Diagnosis not present

## 2017-05-25 DIAGNOSIS — Z5181 Encounter for therapeutic drug level monitoring: Secondary | ICD-10-CM

## 2017-05-25 LAB — POCT INR: INR: 2.1

## 2017-05-25 NOTE — Patient Instructions (Signed)
Description   Continue  taking 1.5 tablets every day except 2 tablets only on Wednesdays. Recheck in 6 weeks.      

## 2017-06-01 DIAGNOSIS — R338 Other retention of urine: Secondary | ICD-10-CM | POA: Diagnosis not present

## 2017-06-02 ENCOUNTER — Other Ambulatory Visit: Payer: Self-pay | Admitting: Cardiovascular Disease

## 2017-07-01 DIAGNOSIS — R338 Other retention of urine: Secondary | ICD-10-CM | POA: Diagnosis not present

## 2017-07-05 DIAGNOSIS — R310 Gross hematuria: Secondary | ICD-10-CM | POA: Diagnosis not present

## 2017-07-13 ENCOUNTER — Other Ambulatory Visit: Payer: Self-pay

## 2017-07-13 ENCOUNTER — Inpatient Hospital Stay (HOSPITAL_COMMUNITY): Payer: Medicare Other

## 2017-07-13 ENCOUNTER — Inpatient Hospital Stay (HOSPITAL_COMMUNITY)
Admission: EM | Admit: 2017-07-13 | Discharge: 2017-07-16 | DRG: 698 | Disposition: A | Discharge status: Home Health | Payer: Medicare Other | Attending: Family Medicine | Admitting: Family Medicine

## 2017-07-13 ENCOUNTER — Emergency Department (HOSPITAL_COMMUNITY): Payer: Medicare Other

## 2017-07-13 DIAGNOSIS — N390 Urinary tract infection, site not specified: Secondary | ICD-10-CM | POA: Diagnosis not present

## 2017-07-13 DIAGNOSIS — N281 Cyst of kidney, acquired: Secondary | ICD-10-CM | POA: Diagnosis present

## 2017-07-13 DIAGNOSIS — I69319 Unspecified symptoms and signs involving cognitive functions following cerebral infarction: Secondary | ICD-10-CM | POA: Diagnosis not present

## 2017-07-13 DIAGNOSIS — N138 Other obstructive and reflux uropathy: Secondary | ICD-10-CM | POA: Diagnosis not present

## 2017-07-13 DIAGNOSIS — I482 Chronic atrial fibrillation: Secondary | ICD-10-CM | POA: Diagnosis present

## 2017-07-13 DIAGNOSIS — I444 Left anterior fascicular block: Secondary | ICD-10-CM | POA: Diagnosis present

## 2017-07-13 DIAGNOSIS — I110 Hypertensive heart disease with heart failure: Secondary | ICD-10-CM | POA: Diagnosis present

## 2017-07-13 DIAGNOSIS — I080 Rheumatic disorders of both mitral and aortic valves: Secondary | ICD-10-CM | POA: Diagnosis present

## 2017-07-13 DIAGNOSIS — R000 Tachycardia, unspecified: Secondary | ICD-10-CM | POA: Diagnosis not present

## 2017-07-13 DIAGNOSIS — N400 Enlarged prostate without lower urinary tract symptoms: Secondary | ICD-10-CM | POA: Diagnosis present

## 2017-07-13 DIAGNOSIS — M109 Gout, unspecified: Secondary | ICD-10-CM | POA: Diagnosis present

## 2017-07-13 DIAGNOSIS — H9190 Unspecified hearing loss, unspecified ear: Secondary | ICD-10-CM | POA: Diagnosis present

## 2017-07-13 DIAGNOSIS — Z7982 Long term (current) use of aspirin: Secondary | ICD-10-CM

## 2017-07-13 DIAGNOSIS — I272 Pulmonary hypertension, unspecified: Secondary | ICD-10-CM | POA: Diagnosis not present

## 2017-07-13 DIAGNOSIS — I4821 Permanent atrial fibrillation: Secondary | ICD-10-CM | POA: Diagnosis present

## 2017-07-13 DIAGNOSIS — R6520 Severe sepsis without septic shock: Secondary | ICD-10-CM | POA: Diagnosis not present

## 2017-07-13 DIAGNOSIS — I5032 Chronic diastolic (congestive) heart failure: Secondary | ICD-10-CM | POA: Diagnosis not present

## 2017-07-13 DIAGNOSIS — A4151 Sepsis due to Escherichia coli [E. coli]: Secondary | ICD-10-CM | POA: Diagnosis present

## 2017-07-13 DIAGNOSIS — E785 Hyperlipidemia, unspecified: Secondary | ICD-10-CM | POA: Diagnosis present

## 2017-07-13 DIAGNOSIS — Z87891 Personal history of nicotine dependence: Secondary | ICD-10-CM

## 2017-07-13 DIAGNOSIS — R0602 Shortness of breath: Secondary | ICD-10-CM | POA: Diagnosis not present

## 2017-07-13 DIAGNOSIS — R9431 Abnormal electrocardiogram [ECG] [EKG]: Secondary | ICD-10-CM | POA: Diagnosis present

## 2017-07-13 DIAGNOSIS — G9340 Encephalopathy, unspecified: Secondary | ICD-10-CM | POA: Diagnosis not present

## 2017-07-13 DIAGNOSIS — Y846 Urinary catheterization as the cause of abnormal reaction of the patient, or of later complication, without mention of misadventure at the time of the procedure: Secondary | ICD-10-CM | POA: Diagnosis present

## 2017-07-13 DIAGNOSIS — T83511A Infection and inflammatory reaction due to indwelling urethral catheter, initial encounter: Secondary | ICD-10-CM | POA: Diagnosis not present

## 2017-07-13 DIAGNOSIS — I2510 Atherosclerotic heart disease of native coronary artery without angina pectoris: Secondary | ICD-10-CM | POA: Diagnosis present

## 2017-07-13 DIAGNOSIS — N179 Acute kidney failure, unspecified: Secondary | ICD-10-CM | POA: Diagnosis not present

## 2017-07-13 DIAGNOSIS — A419 Sepsis, unspecified organism: Secondary | ICD-10-CM | POA: Diagnosis not present

## 2017-07-13 DIAGNOSIS — E7800 Pure hypercholesterolemia, unspecified: Secondary | ICD-10-CM | POA: Diagnosis present

## 2017-07-13 DIAGNOSIS — I10 Essential (primary) hypertension: Secondary | ICD-10-CM | POA: Diagnosis present

## 2017-07-13 DIAGNOSIS — R402414 Glasgow coma scale score 13-15, 24 hours or more after hospital admission: Secondary | ICD-10-CM | POA: Diagnosis not present

## 2017-07-13 DIAGNOSIS — I4510 Unspecified right bundle-branch block: Secondary | ICD-10-CM | POA: Diagnosis not present

## 2017-07-13 DIAGNOSIS — I252 Old myocardial infarction: Secondary | ICD-10-CM

## 2017-07-13 DIAGNOSIS — R310 Gross hematuria: Secondary | ICD-10-CM | POA: Diagnosis present

## 2017-07-13 DIAGNOSIS — I4891 Unspecified atrial fibrillation: Secondary | ICD-10-CM | POA: Diagnosis not present

## 2017-07-13 DIAGNOSIS — Z79899 Other long term (current) drug therapy: Secondary | ICD-10-CM

## 2017-07-13 DIAGNOSIS — Z888 Allergy status to other drugs, medicaments and biological substances status: Secondary | ICD-10-CM

## 2017-07-13 DIAGNOSIS — N401 Enlarged prostate with lower urinary tract symptoms: Secondary | ICD-10-CM | POA: Diagnosis present

## 2017-07-13 DIAGNOSIS — R0902 Hypoxemia: Secondary | ICD-10-CM | POA: Diagnosis not present

## 2017-07-13 DIAGNOSIS — Y732 Prosthetic and other implants, materials and accessory gastroenterology and urology devices associated with adverse incidents: Secondary | ICD-10-CM | POA: Diagnosis present

## 2017-07-13 DIAGNOSIS — Z7901 Long term (current) use of anticoagulants: Secondary | ICD-10-CM

## 2017-07-13 DIAGNOSIS — R531 Weakness: Secondary | ICD-10-CM | POA: Diagnosis not present

## 2017-07-13 LAB — COMPREHENSIVE METABOLIC PANEL
ALT: 23 U/L (ref 17–63)
ANION GAP: 12 (ref 5–15)
AST: 27 U/L (ref 15–41)
Albumin: 3.3 g/dL — ABNORMAL LOW (ref 3.5–5.0)
Alkaline Phosphatase: 53 U/L (ref 38–126)
BUN: 33 mg/dL — AB (ref 6–20)
CHLORIDE: 102 mmol/L (ref 101–111)
CO2: 23 mmol/L (ref 22–32)
Calcium: 9.9 mg/dL (ref 8.9–10.3)
Creatinine, Ser: 1.52 mg/dL — ABNORMAL HIGH (ref 0.61–1.24)
GFR calc Af Amer: 45 mL/min — ABNORMAL LOW (ref >60)
GFR calc non Af Amer: 39 mL/min — ABNORMAL LOW (ref >60)
Glucose, Bld: 114 mg/dL — ABNORMAL HIGH (ref 65–99)
POTASSIUM: 3.6 mmol/L (ref 3.5–5.1)
Sodium: 137 mmol/L (ref 135–145)
Total Bilirubin: 0.8 mg/dL (ref 0.3–1.2)
Total Protein: 7.1 g/dL (ref 6.5–8.1)

## 2017-07-13 LAB — CBC WITH DIFFERENTIAL/PLATELET
ABS IMMATURE GRANULOCYTES: 0.2 10*3/uL — AB (ref 0.0–0.1)
Basophils Absolute: 0.1 10*3/uL (ref 0.0–0.1)
Basophils Relative: 0 %
Eosinophils Absolute: 0 10*3/uL (ref 0.0–0.7)
Eosinophils Relative: 0 %
HEMATOCRIT: 38.4 % — AB (ref 39.0–52.0)
HEMOGLOBIN: 12.7 g/dL — AB (ref 13.0–17.0)
IMMATURE GRANULOCYTES: 1 %
LYMPHS ABS: 0.4 10*3/uL — AB (ref 0.7–4.0)
LYMPHS PCT: 2 %
MCH: 31.8 pg (ref 26.0–34.0)
MCHC: 33.1 g/dL (ref 30.0–36.0)
MCV: 96 fL (ref 78.0–100.0)
Monocytes Absolute: 0.9 10*3/uL (ref 0.1–1.0)
Monocytes Relative: 4 %
NEUTROS ABS: 19.9 10*3/uL — AB (ref 1.7–7.7)
NEUTROS PCT: 93 %
Platelets: 215 10*3/uL (ref 150–400)
RBC: 4 MIL/uL — AB (ref 4.22–5.81)
RDW: 13.9 % (ref 11.5–15.5)
WBC: 21.4 10*3/uL — ABNORMAL HIGH (ref 4.0–10.5)

## 2017-07-13 LAB — URINALYSIS, ROUTINE W REFLEX MICROSCOPIC
Bilirubin Urine: NEGATIVE
Glucose, UA: NEGATIVE mg/dL
Ketones, ur: NEGATIVE mg/dL
Nitrite: NEGATIVE
PROTEIN: 100 mg/dL — AB
SPECIFIC GRAVITY, URINE: 1.012 (ref 1.005–1.030)
pH: 6 (ref 5.0–8.0)

## 2017-07-13 LAB — I-STAT CG4 LACTIC ACID, ED
LACTIC ACID, VENOUS: 2.13 mmol/L — AB (ref 0.5–1.9)
LACTIC ACID, VENOUS: 2.24 mmol/L — AB (ref 0.5–1.9)

## 2017-07-13 LAB — PROTIME-INR
INR: 2.18
Prothrombin Time: 24.1 seconds — ABNORMAL HIGH (ref 11.4–15.2)

## 2017-07-13 LAB — DIGOXIN LEVEL

## 2017-07-13 MED ORDER — IPRATROPIUM BROMIDE 0.02 % IN SOLN
0.5000 mg | Freq: Four times a day (QID) | RESPIRATORY_TRACT | Status: DC
  Administered 2017-07-13: 0.5 mg via RESPIRATORY_TRACT
  Filled 2017-07-13: qty 2.5

## 2017-07-13 MED ORDER — FINASTERIDE 5 MG PO TABS
5.0000 mg | ORAL_TABLET | Freq: Every day | ORAL | Status: DC
  Administered 2017-07-13 – 2017-07-16 (×4): 5 mg via ORAL
  Filled 2017-07-13 (×4): qty 1

## 2017-07-13 MED ORDER — CEFTRIAXONE SODIUM 2 G IJ SOLR
2.0000 g | INTRAMUSCULAR | Status: DC
  Administered 2017-07-13: 2 g via INTRAVENOUS
  Filled 2017-07-13: qty 20

## 2017-07-13 MED ORDER — ACETAMINOPHEN 650 MG RE SUPP: 650.0000 mg | Freq: Four times a day (QID) | RECTAL | Status: DC | PRN

## 2017-07-13 MED ORDER — SODIUM CHLORIDE 0.9 % IV SOLN: 1.0000 g | INTRAVENOUS | Status: DC

## 2017-07-13 MED ORDER — DIGOXIN 125 MCG PO TABS
125.0000 ug | ORAL_TABLET | ORAL | Status: DC
  Administered 2017-07-14 – 2017-07-16 (×2): 125 ug via ORAL
  Filled 2017-07-13 (×2): qty 1

## 2017-07-13 MED ORDER — SODIUM CHLORIDE 0.9 % IV BOLUS
1000.0000 mL | Freq: Once | INTRAVENOUS | Status: AC
  Administered 2017-07-13: 1000 mL via INTRAVENOUS

## 2017-07-13 MED ORDER — HYDROCODONE-ACETAMINOPHEN 5-325 MG PO TABS: 1.0000 | ORAL_TABLET | ORAL | Status: DC | PRN

## 2017-07-13 MED ORDER — SODIUM CHLORIDE 0.9 % IV SOLN
INTRAVENOUS | Status: DC
  Administered 2017-07-13: 19:00:00 via INTRAVENOUS

## 2017-07-13 MED ORDER — ACETAMINOPHEN 325 MG PO TABS
650.0000 mg | ORAL_TABLET | Freq: Four times a day (QID) | ORAL | Status: DC | PRN
  Administered 2017-07-16: 650 mg via ORAL
  Filled 2017-07-13: qty 2

## 2017-07-13 MED ORDER — ALLOPURINOL 100 MG PO TABS
100.0000 mg | ORAL_TABLET | Freq: Every day | ORAL | Status: DC
  Administered 2017-07-13 – 2017-07-16 (×4): 100 mg via ORAL
  Filled 2017-07-13 (×4): qty 1

## 2017-07-13 MED ORDER — ONDANSETRON HCL 4 MG PO TABS: 4.0000 mg | ORAL_TABLET | Freq: Four times a day (QID) | ORAL | Status: DC | PRN

## 2017-07-13 MED ORDER — SENNOSIDES-DOCUSATE SODIUM 8.6-50 MG PO TABS: 1.0000 | ORAL_TABLET | Freq: Every evening | ORAL | Status: DC | PRN

## 2017-07-13 MED ORDER — ONDANSETRON HCL 4 MG/2ML IJ SOLN: 4.0000 mg | Freq: Four times a day (QID) | INTRAMUSCULAR | Status: DC | PRN

## 2017-07-13 MED ORDER — OMEGA-3-ACID ETHYL ESTERS 1 G PO CAPS
1.0000 g | ORAL_CAPSULE | Freq: Every day | ORAL | Status: DC
  Administered 2017-07-13 – 2017-07-16 (×4): 1 g via ORAL
  Filled 2017-07-13 (×4): qty 1

## 2017-07-13 NOTE — ED Notes (Signed)
Pt had a small amount of blood around meatus around foley catheter. Pt cleaned and Josh, PA notified. Will continue to monitor

## 2017-07-13 NOTE — ED Notes (Signed)
Bladder scan reveal 845ml urine in bladder. Attempted to irrigate bladder. 120ml sterile water instilled, approx 15ml bloody urine returned. Unable to further irrigate. Arthor CaptainAbigail Harris, PA-C made aware.

## 2017-07-13 NOTE — H&P (Signed)
History and Physical        Hospital Admission Note Date: 07/13/2017  Patient name: [name] Medical record number: [id number] Date of birth: 13-Apr-1925 Age: 84 y.o. Gender: male  PCP: Ria Bush, MD  Primary urologist: Dr. Jeffie Pollock  Patient coming from: Home  I have reviewed all records in the [facility].    Chief Complaint:  Fever chills, generalized weakness, hematuria since this morning  HPI: Patient is a 84 year old male with history of chronic atrial for ablation on warfarin, CAD, prior history of CVA x4, gout, hypertension, hyperlipidemia, has chronic indwelling Foley catheter for last 1 year, changed every month presented with fever, chills, generalized weakness hematuria since this morning.  Per patient and family, last Thursday, week ago, patient had hematuria and difficulty urinating.  Patient then followed up with his urologist, the Foley was irrigated and subsequently working well.  Patient was told to temporarily stop warfarin, he restarted again yesterday on 6/4.  This morning, patient woke up and was febrile 101 F, having chills, generalized weakness, again noticed to have clots and difficulty urinating.  Patient presented to ED, urinary catheter was changed and flushed with good output greater than 1 L.  ED work-up/course:  Temp 101 F, BP 109/56, heart rate 90-110, respiratory rate 16 Sodium 137, BUN 33, creatinine 1.5, lactic acidosis 2.2, white count 21.4, hemoglobin 12.7 Baseline creatinine 1.2-1.3, on 1/14 was 1.5 EKG showed rate 91, atrial fibrillation  Review of Systems: Positives marked in 'bold' Constitutional: + fever, chills, diaphoresis, poor appetite and fatigue.  HEENT: Denies photophobia, eye pain, redness, + hearing loss, ear pain, congestion, sore throat, rhinorrhea, sneezing, mouth sores, trouble swallowing, neck pain,  neck stiffness and tinnitus.   Respiratory: Denies SOB, DOE, cough, chest tightness,  and wheezing.   Cardiovascular: Denies chest pain, palpitations and leg swelling.  Gastrointestinal: Denies nausea, vomiting, abdominal pain, diarrhea, constipation, blood in stool and abdominal distention.  Genitourinary: Denies dysuria, urgency, frequency, hematuria, no flank pain   Musculoskeletal: Denies myalgias, back pain, joint swelling, arthralgias and gait problem.  Skin: Denies pallor, rash and wound.  Neurological: Denies dizziness, seizures, syncope, weakness, light-headedness, numbness and headaches.  Hematological: Denies adenopathy. Easy bruising, personal or family bleeding history  Psychiatric/Behavioral: Denies suicidal ideation, mood changes, confusion, nervousness, sleep disturbance and agitation  Past Medical History: Past Medical History:  Diagnosis Date  . Atrial fibrillation (HCC)    chronic  . CAD (coronary artery disease)    s/p MI, unsure when  . CHF (congestive heart failure) ([place])   . Cognitive impairment    from stroke  . CVA (cerebrovascular accident) ([place]) x4 (latest 4656)   embolic, residual cognitive impairment  . DOE (dyspnea on exertion)   . Gout   . HTN (hypertension)   . Hyperlipidemia     Past Surgical History:  Procedure Laterality Date  . APPENDECTOMY  1994  . CARDIOVASCULAR STRESS TEST  01/11/2008   evidence of an old scar of the septum and a larger apical scar but no reversible ischemia, EF 48%  . CATARACT EXTRACTION Left 2008   [facility]  . [place]   x 2  .  HEMORRHOID SURGERY    . TONSILLECTOMY  1964  . US ECHOCARDIOGRAPHY  12/19/2007   EF 55-60%, mild aortic stenosis and mild pulmonary hypertension and mild mitral regurgitation    Medications: Prior to Admission medications   Medication Sig Start Date End Date Taking? Authorizing Provider  allopurinol (ZYLOPRIM) 100 MG tablet Take 1 tablet (100 mg total) by mouth daily.  02/21/17  Yes Ria Bush, MD  aspirin EC 81 MG tablet Take 81 mg by mouth daily.   Yes [provider]  digoxin (LANOXIN) 0.125 MG tablet Take 1 tablet (125 mcg total) by mouth every other day. Patient taking differently: Take 125 mcg by mouth at bedtime.  01/31/17  Yes Nahser, Wonda Cheng, MD  finasteride (PROSCAR) 5 MG tablet Take 5 mg by mouth daily.   Yes [provider]  furosemide (LASIX) 40 MG tablet TAKE TWO TABLETS BY MOUTH ONCE DAILY IN THE MORNING 01/25/17  Yes Nahser, Wonda Cheng, MD  lisinopril-hydrochlorothiazide (PRINZIDE,ZESTORETIC) 20-12.5 MG tablet TAKE ONE TABLET BY MOUTH IN THE MORNING 01/03/17  Yes Nahser, Wonda Cheng, MD  metoprolol succinate (TOPROL-XL) 25 MG 24 hr tablet TAKE ONE-HALF TABLET BY MOUTH ONCE DAILY Patient taking differently: Take 12.5 mg by mouth daily. TAKE ONE-HALF TABLET BY MOUTH ONCE DAILY 01/06/17  Yes Nahser, Wonda Cheng, MD  nitroGLYCERIN (NITROSTAT) 0.4 MG SL tablet Place 0.4 mg under the tongue every 5 (five) minutes as needed (for chest pain).    Yes [provider]  Omega-3 Fatty Acids (FISH OIL) 1000 MG CAPS Take 1,000 mg by mouth daily.    Yes [provider]  levofloxacin (LEVAQUIN) 750 MG tablet Take 1 tablet (750 mg total) by mouth daily. X 7 days Patient not taking: Reported on 07/13/2017 11/01/16   Veryl Speak, MD  warfarin (COUMADIN) 5 MG tablet TAKE 1 TABLET BY MOUTH ONCE DAILY AS DIRECTED BY  COUMADIN  CLINIC 06/02/17   Nahser, Wonda Cheng, MD    Allergies:   Allergies  Allergen Reactions  . Benazepril Hcl Other (See Comments)    Lethargic, nervous, nauseated    Social History:  reports that he quit smoking about 67 years ago. He has never used smokeless tobacco. He reports that he does not drink alcohol or use drugs.  Family History: Family History  Problem Relation Age of Onset  . Cancer Brother        brain  . Cancer Sister        colon  . CAD Sister   . Alcohol abuse Brother     Physical  Exam: Blood pressure (!) 109/56, pulse 90, temperature (!) 101 F (38.3 C), temperature source Rectal, resp. rate 18, SpO2 97 %. General: Alert, awake, oriented x3, in no acute distress, hard of hearing. Eyes: pink conjunctiva,anicteric sclera, pupils equal and reactive to light and accomodation, HEENT: normocephalic, atraumatic, oropharynx clear Neck: supple, no masses or lymphadenopathy, no goiter, no bruits, no JVD CVS: Regular rate and rhythm, without murmurs, rubs or gallops. No lower extremity edema Resp : Clear to auscultation bilaterally, no wheezing, rales or rhonchi. GI : Soft, nontender, nondistended, positive bowel sounds, no masses. No hepatomegaly. No hernia.  Musculoskeletal: No clubbing or cyanosis, positive pedal pulses. No contracture. ROM intact  Neuro: Grossly intact, no focal neurological deficits, strength 5/5 upper and lower extremities bilaterally Psych: alert and oriented x 3, normal mood and affect Skin: no rashes or lesions, warm and dry   LABS on Admission: I have personally reviewed all the labs and  imagings below    Basic Metabolic Panel: Recent Labs  Lab 07/13/17 1538  NA 137  K 3.6  CL 102  CO2 23  GLUCOSE 114*  BUN 33*  CREATININE 1.52*  CALCIUM 9.9   Liver Function Tests: Recent Labs  Lab 07/13/17 1538  AST 27  ALT 23  ALKPHOS 53  BILITOT 0.8  PROT 7.1  ALBUMIN 3.3*   No results for input(s): LIPASE, AMYLASE in the last 168 hours. No results for input(s): AMMONIA in the last 168 hours. CBC: Recent Labs  Lab 07/13/17 1538  WBC 21.4*  NEUTROABS 19.9*  HGB 12.7*  HCT 38.4*  MCV 96.0  PLT 215   Cardiac Enzymes: No results for input(s): CKTOTAL, CKMB, CKMBINDEX, TROPONINI in the last 168 hours. BNP: Invalid input(s): POCBNP CBG: No results for input(s): GLUCAP in the last 168 hours.  Radiological Exams on Admission:  Dg Chest Port 1 View  Result Date: 07/13/2017 CLINICAL DATA:  Fever, congestion, shortness of breath. EXAM:  PORTABLE CHEST 1 VIEW COMPARISON:  Chest x-ray dated September 07, 2016. FINDINGS: The patient is rotated to the left. Mild cardiomegaly. Atherosclerotic calcification of the aortic arch. Stable linear scarring/atelectasis at the lung bases. No focal consolidation, pleural effusion, or pneumothorax. No acute osseous abnormality. IMPRESSION: No active cardiopulmonary disease. Electronically Signed   By: Titus Dubin M.D.   On: 07/13/2017 17:06      EKG: Independently reviewed.  Rate 91, atrial fibrillation  Assessment/Plan Principal Problem:   Sepsis ([place]) secondary to UTI  -Patient met sepsis criteria at the time of admission with fever, tachycardia, hypotension, lactic acidosis, leukocytosis, source likely due to UTI -Follow urine cultures, blood cultures, placed on IV fluid hydration, hold antihypertensives -Continue IV Rocephin, follow sensitivities -Discussed with urology, Dr. McDiarmid, urology will round in a.m., follow renal ultrasound second episode of obstruction in 1 week.   Active Problems:   Permanent atrial fibrillation (HCC) -Currently rate controlled, continue digoxin,  - holding antihypertensives secondary to low BP - Once BP is stable, will restart beta-blocker -Hold warfarin, will check INR, digoxin level     Chronic diastolic congestive heart failure (HCC) -Currently appears to be dehydrated, hold Lasix, placed on gentle hydration  - follow I's and O's    Hypercholesterolemia -Continue omega-3 fatty acids daily    BPH (benign prostatic hyperplasia) - continue finasteride     Essential hypertension - BP currently low secondary to #1, hold lisinopril HCTZ, metoprolol, furosemide   DVT prophylaxis: SCDs  CODE STATUS: Full CODE STATUS, discussed with the patient  Consults called: Urology, Dr. McDiarmid  Family Communication: Admission, patients condition and plan of care including tests being ordered have been discussed with the patient, wife and niece who  indicates understanding and agree with the plan and Code Status  Admission status: Inpatient telemetry  Disposition plan: Further plan will depend as patient's clinical course evolves and further radiologic and laboratory data become available.    At the time of admission, it appears that the appropriate admission status for this patient is INPATIENT . This is judged to be reasonable and necessary in order to provide the required intensity of service to ensure the patient's safety given the presenting symptoms sepsis, UTI, urinary obstruction, hematuria, physical exam findings, and initial radiographic and laboratory data in the context of their chronic comorbidities.  The medical decision making on this patient was of high complexity and the patient is at high risk for clinical deterioration, therefore this is a level 3 visit.  Time Spent on Admission: 29mns     Kristiann Noyce M.D. [facility] 07/13/2017, 6:21 PM Pager: [phone] If 7PM-7AM, please contact night-coverage [email or web address] Password [email or web address]

## 2017-07-13 NOTE — Progress Notes (Signed)
Received report on pt.

## 2017-07-13 NOTE — Progress Notes (Signed)
Attempted to get report from number left by ED RN x1.

## 2017-07-13 NOTE — Progress Notes (Signed)
Pt arrived on unit. Pt A&O x2. Pt made comfortable in bed and given callbell. Tele and continuous pulse ox placed on pt. Will continue to assess.

## 2017-07-13 NOTE — ED Notes (Signed)
Attempted report x1. 

## 2017-07-13 NOTE — ED Provider Notes (Signed)
[facility] Note   CSN: [id number] Arrival date & time: 07/13/17  1222     History   Chief Complaint Chief Complaint  Patient presents with  . Weakness  . Hematuria    HPI [name] is a 84 y.o. male who presents the emergency department for urinary catheter malfunction and altered mental status.  History is given by the patient's niece who is at bedside.  Patient is complaining that he is very hot.  Found to be febrile to 101.  The knee states that he has a urinary catheter in place that has had to be irrigated by the urologist in the past secondary to clots.  She tried to irrigate his catheter today however cannot get any return and thinks that he is retaining.  Letter scan performed and shows 897 mL's of retained urine.  Patient appears extremely uncomfortable.  HPI  Past Medical History:  Diagnosis Date  . Atrial fibrillation (HCC)    chronic  . CAD (coronary artery disease)    s/p MI, unsure when  . CHF (congestive heart failure) (HCC)   . Cognitive impairment    from stroke  . 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 DOE (dyspnea on exertion)   . Gout   . HTN (hypertension)   . Hyperlipidemia     Patient Active Problem List   Diagnosis Date Noted  . Renal insufficiency 02/27/2017  . Health maintenance examination 10/06/2016  . Primary hyperparathyroidism (HCC) 10/06/2016  . Sacral pressure sore 10/06/2016  . Urinary incontinence 10/06/2016  . Tinea pedis 03/26/2016  . Medicare annual wellness visit, subsequent 10/31/2014  . Advanced care planning/counseling discussion 10/31/2014  . DNR (do not resuscitate) 10/31/2014  . Dyspnea on exertion 09/03/2013  . Encounter for therapeutic drug monitoring 05/04/2013  . Gout 09/01/2012  . Lesion of skin of cheek 06/23/2012  . Cognitive impairment   . Permanent atrial fibrillation (HCC) 06/08/2010  . Long  term (current) use of anticoagulants 06/08/2010  . Right bundle branch block 06/08/2010  . Aphasia, post-stroke 06/08/2010  . Aortic stenosis, mild 06/08/2010  . Chronic diastolic congestive heart failure (HCC) 06/08/2010  . Hypercholesterolemia 06/08/2010  . BPH (benign prostatic hyperplasia) 06/08/2010  . Essential hypertension 06/08/2010    Past Surgical History:  Procedure Laterality Date  . APPENDECTOMY  1994  . CARDIOVASCULAR STRESS TEST  01/11/2008   evidence of an old scar of the septum and a larger apical scar but no reversible ischemia, EF 48%  . CATARACT EXTRACTION Left 2008   [facility]  . EXCISION MORTON'S NEUROMA  1995   x 2  . HEMORRHOID SURGERY    . TONSILLECTOMY  1964  . US ECHOCARDIOGRAPHY  12/19/2007   EF 55-60%, mild aortic stenosis and mild pulmonary hypertension and mild mitral regurgitation        Home Medications    Prior to Admission medications   Medication Sig Start Date End Date Taking? Authorizing Provider  allopurinol (ZYLOPRIM) 100 MG tablet Take 1 tablet (100 mg total) by mouth daily. 02/21/17  Yes Eustaquio BoydenGutierrez, Javier, MD  aspirin EC 81 MG tablet Take 81 mg by mouth daily.   Yes [provider]  digoxin (LANOXIN) 0.125 MG tablet Take 1 tablet (125 mcg total) by mouth every other day. Patient taking differently: Take 125 mcg by mouth at bedtime.  01/31/17  Yes Nahser, Deloris PingPhilip J, MD  finasteride (PROSCAR) 5 MG tablet Take 5  mg by mouth daily.   Yes [provider]  furosemide (LASIX) 40 MG tablet TAKE TWO TABLETS BY MOUTH ONCE DAILY IN THE MORNING 01/25/17  Yes Nahser, Deloris Ping, MD  lisinopril-hydrochlorothiazide (PRINZIDE,ZESTORETIC) 20-12.5 MG tablet TAKE ONE TABLET BY MOUTH IN THE MORNING 01/03/17  Yes Nahser, Deloris Ping, MD  metoprolol succinate (TOPROL-XL) 25 MG 24 hr tablet TAKE ONE-HALF TABLET BY MOUTH ONCE DAILY Patient taking differently: Take 12.5 mg by mouth daily. TAKE ONE-HALF TABLET BY MOUTH ONCE DAILY 01/06/17  Yes Nahser,  Deloris Ping, MD  nitroGLYCERIN (NITROSTAT) 0.4 MG SL tablet Place 0.4 mg under the tongue every 5 (five) minutes as needed (for chest pain).    Yes [provider]  Omega-3 Fatty Acids (FISH OIL) 1000 MG CAPS Take 1,000 mg by mouth daily.    Yes [provider]  levofloxacin (LEVAQUIN) 750 MG tablet Take 1 tablet (750 mg total) by mouth daily. X 7 days Patient not taking: Reported on 07/13/2017 11/01/16   Geoffery Lyons, MD  warfarin (COUMADIN) 5 MG tablet TAKE 1 TABLET BY MOUTH ONCE DAILY AS DIRECTED BY  COUMADIN  CLINIC 06/02/17   Nahser, Deloris Ping, MD    Family History Family History  Problem Relation Age of Onset  . Cancer Brother        brain  . Cancer Sister        colon  . CAD Sister   . Alcohol abuse Brother     Social History Social History   Tobacco Use  . Smoking status: Former Smoker    Last attempt to quit: 02/08/1950    Years since quitting: 67.4  . Smokeless tobacco: Never Used  Substance Use Topics  . Alcohol use: No  . Drug use: No     Allergies   Benazepril hcl   Review of Systems Review of Systems  Ten systems reviewed and are negative for acute change, except as noted in the HPI.   Physical Exam Updated Vital Signs Temp (!) 101 F (38.3 C) (Rectal)   SpO2 97%   Physical Exam  Constitutional: He appears well-developed and well-nourished. No distress.  HENT:  Head: Normocephalic and atraumatic.  Eyes: Pupils are equal, round, and reactive to light. Conjunctivae and EOM are normal. No scleral icterus.  Neck: Normal range of motion. Neck supple.  Cardiovascular: Normal rate, regular rhythm and normal heart sounds.  Pulmonary/Chest: Effort normal and breath sounds normal. No respiratory distress.  Abdominal: Soft. He exhibits distension. There is tenderness.  Palpable bladder distention to the umbilicus  Musculoskeletal: He exhibits no edema.  Neurological: He is alert.  Skin: Skin is warm and dry. He is not diaphoretic.  Psychiatric:  His behavior is normal.  Nursing note and vitals reviewed.    ED Treatments / Results  Labs (all labs ordered are listed, but only abnormal results are displayed) Labs Reviewed  URINALYSIS, ROUTINE W REFLEX MICROSCOPIC - Abnormal; Notable for the following components:      Result Value   Color, Urine AMBER (*)    APPearance CLOUDY (*)    Hgb urine dipstick LARGE (*)    Protein, ur 100 (*)    Leukocytes, UA MODERATE (*)    RBC / HPF >50 (*)    Bacteria, UA RARE (*)    All other components within normal limits  URINE CULTURE  CULTURE, BLOOD (ROUTINE X 2)  CULTURE, BLOOD (ROUTINE X 2)  COMPREHENSIVE METABOLIC PANEL  CBC WITH DIFFERENTIAL/PLATELET  I-STAT CG4 LACTIC ACID, ED  EKG None  Radiology No results found.  Procedures Procedures (including critical care time)  Medications Ordered in ED Medications - No data to display   Initial Impression / Assessment and Plan / ED Course  I have reviewed the triage vital signs and the nursing notes.  Pertinent labs & imaging results that were available during my care of the patient were reviewed by me and considered in my medical decision making (see chart for details).    Since urinary catheter changed and flushed with good output greater than 1 L.  Patient is now resting comfortably.  He was given Tylenol for his fever.  Lactic acid slightly elevated at 2.13.  I have ordered some fluid rehydration, and initiated code sepsis.  His lab work is still currently pending and patient will receive 2 g IV Rocephin.  I have given sign out to PA Greenwald who will assume care of the patient.  I expect admission on the patient.  He is hemodynamically stable here in the emergency department. Final Clinical Impressions(s) / ED Diagnoses   Final diagnoses:  None    ED Discharge Orders    None       Arthor Captain, PA-C 07/13/17 1613    Blane Ohara, MD 07/13/17 [phone]

## 2017-07-13 NOTE — ED Triage Notes (Signed)
[facility]- pt coming from home complaint of generalized weakness, hematuria, and tremors for the last couple of days. Pt appears to be short of breath. Noted to be in afib with EMS with hx of same. Congested cough present on arrival. Vitals stable with EMS.

## 2017-07-13 NOTE — Consult Note (Signed)
Urology Consult  Referring physician: Tyler Pita Reason for referral: UTI  Chief Complaint: UTI with foley  History of Present Illness: Elderly male admitted for sepsis; has foley on coumadin; medical issues; foley for one year; recent foley blocked and irrigated by urology; catheter in ER changed and flushed for one liter;   Now draining well Has done well for one year Catheter changes in [place]  Modifying factors: There are no other modifying factors  Associated signs and symptoms: There are no other associated signs and symptoms Aggravating and relieving factors: There are no other aggravating or relieving factors Severity: Moderate Duration: Persistent  Past Medical History:  Diagnosis Date  . Atrial fibrillation (HCC)    chronic  . CAD (coronary artery disease)    s/p MI, unsure when  . CHF (congestive heart failure) ([place])   . Cognitive impairment    from stroke  . CVA (cerebrovascular accident) ([place]) x4 (latest 8119)   embolic, residual cognitive impairment  . DOE (dyspnea on exertion)   . Gout   . HTN (hypertension)   . Hyperlipidemia    Past Surgical History:  Procedure Laterality Date  . APPENDECTOMY  1994  . CARDIOVASCULAR STRESS TEST  01/11/2008   evidence of an old scar of the septum and a larger apical scar but no reversible ischemia, EF 48%  . CATARACT EXTRACTION Left 2008   [place]  . [place]   x 2  . HEMORRHOID SURGERY    . TONSILLECTOMY  1964  . US ECHOCARDIOGRAPHY  12/19/2007   EF 55-60%, mild aortic stenosis and mild pulmonary hypertension and mild mitral regurgitation    Medications: I have reviewed the patient's current medications. Allergies:  Allergies  Allergen Reactions  . Benazepril Hcl Other (See Comments)    Lethargic, nervous, nauseated    Family History  Problem Relation Age of Onset  . Cancer Brother        brain  . Cancer Sister        colon  . CAD Sister   . Alcohol abuse Brother    Social History:  reports  that he quit smoking about 67 years ago. He has never used smokeless tobacco. He reports that he does not drink alcohol or use drugs.  ROS: All systems are reviewed and negative except as noted. Rest negative  Physical Exam:  Vital signs in last 24 hours: Temp:  [98.4 F (36.9 C)-101 F (38.3 C)] 98.4 F (36.9 C) (06/05 1857) Pulse Rate:  [69-110] 75 (06/05 1857) Resp:  [16-29] 16 (06/05 1857) BP: (103-121)/(52-64) 114/56 (06/05 1857) SpO2:  [94 %-97 %] 97 % (06/05 1857)  Cardiovascular: Skin warm; not flushed Respiratory: Breaths quiet; no shortness of breath Abdomen: No masses Neurological: Normal sensation to touch Musculoskeletal: Normal motor function arms and legs Lymphatics: No inguinal adenopathy Skin: No rashes Genitourinary:rest negative  Laboratory Data:  Results for orders placed or performed during the hospital encounter of 07/13/17 (from the past 72 hour(s))  Urinalysis, Routine w reflex microscopic     Status: Abnormal   Collection Time: 07/13/17 12:53 PM  Result Value Ref Range   Color, Urine AMBER (A) YELLOW    Comment: BIOCHEMICALS MAY BE AFFECTED BY COLOR   APPearance CLOUDY (A) CLEAR   Specific Gravity, Urine 1.012 1.005 - 1.030   pH 6.0 5.0 - 8.0   Glucose, UA NEGATIVE NEGATIVE mg/dL   Hgb urine dipstick LARGE (A) NEGATIVE   Bilirubin Urine NEGATIVE NEGATIVE   Ketones, ur  NEGATIVE NEGATIVE mg/dL   Protein, ur 100 (A) NEGATIVE mg/dL   Nitrite NEGATIVE NEGATIVE   Leukocytes, UA MODERATE (A) NEGATIVE   RBC / HPF >50 (H) 0 - 5 RBC/hpf   WBC, UA 6-10 0 - 5 WBC/hpf   Bacteria, UA RARE (A) NONE SEEN   WBC Clumps PRESENT     Comment: Performed at [facility] [address] [place], [place] [address]  Comprehensive metabolic panel     Status: Abnormal   Collection Time: 07/13/17  3:38 PM  Result Value Ref Range   Sodium 137 135 - 145 mmol/L   Potassium 3.6 3.5 - 5.1 mmol/L   Chloride 102 101 - 111 mmol/L   CO2 23 22 - 32 mmol/L   Glucose, Bld  114 (H) 65 - 99 mg/dL   BUN 33 (H) 6 - 20 mg/dL   Creatinine, Ser 1.52 (H) 0.61 - 1.24 mg/dL   Calcium 9.9 8.9 - 10.3 mg/dL   Total Protein 7.1 6.5 - 8.1 g/dL   Albumin 3.3 (L) 3.5 - 5.0 g/dL   AST 27 15 - 41 U/L   ALT 23 17 - 63 U/L   Alkaline Phosphatase 53 38 - 126 U/L   Total Bilirubin 0.8 0.3 - 1.2 mg/dL   GFR calc non Af Amer 39 (L) >60 mL/min   GFR calc Af Amer 45 (L) >60 mL/min    Comment: (NOTE) The eGFR has been calculated using the CKD EPI equation. This calculation has not been validated in all clinical situations. eGFR's persistently <60 mL/min signify possible Chronic Kidney Disease.    Anion gap 12 5 - 15    Comment: Performed at [facility] [address], [place], [place] [address]  CBC WITH DIFFERENTIAL     Status: Abnormal   Collection Time: 07/13/17  3:38 PM  Result Value Ref Range   WBC 21.4 (H) 4.0 - 10.5 K/uL   RBC 4.00 (L) 4.22 - 5.81 MIL/uL   Hemoglobin 12.7 (L) 13.0 - 17.0 g/dL   HCT 38.4 (L) 39.0 - 52.0 %   MCV 96.0 78.0 - 100.0 fL   MCH 31.8 26.0 - 34.0 pg   MCHC 33.1 30.0 - 36.0 g/dL   RDW 13.9 11.5 - 15.5 %   Platelets 215 150 - 400 K/uL   Neutrophils Relative % 93 %   Neutro Abs 19.9 (H) 1.7 - 7.7 K/uL   Lymphocytes Relative 2 %   Lymphs Abs 0.4 (L) 0.7 - 4.0 K/uL   Monocytes Relative 4 %   Monocytes Absolute 0.9 0.1 - 1.0 K/uL   Eosinophils Relative 0 %   Eosinophils Absolute 0.0 0.0 - 0.7 K/uL   Basophils Relative 0 %   Basophils Absolute 0.1 0.0 - 0.1 K/uL   Immature Granulocytes 1 %   Abs Immature Granulocytes 0.2 (H) 0.0 - 0.1 K/uL    Comment: Performed at [facility], [address], [place], [place] [address]  I-Stat CG4 Lactic Acid, ED  (not at  [facility])     Status: Abnormal   Collection Time: 07/13/17  3:57 PM  Result Value Ref Range   Lactic Acid, Venous 2.13 (HH) 0.5 - 1.9 mmol/L   Comment NOTIFIED PHYSICIAN   I-Stat CG4 Lactic Acid, ED  (not at  [facility])     Status: Abnormal   Collection Time: 07/13/17  6:02 PM   Result Value Ref Range   Lactic Acid, Venous 2.24 (HH) 0.5 - 1.9 mmol/L  Comment NOTIFIED PHYSICIAN    No results found for this or any previous visit (from the past 240 hour(s)). Creatinine: Recent Labs    07/13/17 1538  CREATININE 1.52*    Xrays: See report/chart noted  Impression/Assessment:  Treat UTI Flush catheter PRN  Plan:  F/up with Dr Jeffie Pollock  Mikaella Escalona A 07/13/2017, 7:50 PM

## 2017-07-13 NOTE — ED Provider Notes (Signed)
4:08 PM Handoff from Harris PA-C at shift change. Patient presented with foley, urinary retention as well as fever. This was exchanged and normal flow. Also with some confusion.   Plan: Labs. UA shows infection --> 2g Rocephin ordered. Lactic acid slightly elevated. Will admit when labs returned for likely CAUTI.   5:29 PM Pt examined. Spoke with patient and family. He has small amount of blood coming from around the catheter at meatus.  It does not appear to be mixed with urine.  Likely from trauma due to placing the current catheter.  We will continue to monitor.  Would not change catheter at this time.  Page placed for admission. Pt has [facility] PCP.   BP (!) 103/56   Pulse 78   Temp (!) 101 F (38.3 C) (Rectal)   Resp 19   SpO2 95%    5:39 PM Spoke with Dr. Isidoro Donningai who will see and admit.      Renne CriglerGeiple, Suleyman Ehrman, PA-C 07/13/17 1739    Pricilla LovelessGoldston, Scott, MD 07/18/17 [phone]

## 2017-07-14 ENCOUNTER — Other Ambulatory Visit: Payer: Self-pay

## 2017-07-14 ENCOUNTER — Encounter (HOSPITAL_COMMUNITY): Payer: Self-pay | Admitting: *Deleted

## 2017-07-14 DIAGNOSIS — I5032 Chronic diastolic (congestive) heart failure: Secondary | ICD-10-CM

## 2017-07-14 DIAGNOSIS — A419 Sepsis, unspecified organism: Secondary | ICD-10-CM

## 2017-07-14 DIAGNOSIS — T83511A Infection and inflammatory reaction due to indwelling urethral catheter, initial encounter: Principal | ICD-10-CM

## 2017-07-14 DIAGNOSIS — N390 Urinary tract infection, site not specified: Secondary | ICD-10-CM

## 2017-07-14 DIAGNOSIS — I10 Essential (primary) hypertension: Secondary | ICD-10-CM

## 2017-07-14 DIAGNOSIS — N400 Enlarged prostate without lower urinary tract symptoms: Secondary | ICD-10-CM

## 2017-07-14 DIAGNOSIS — I482 Chronic atrial fibrillation: Secondary | ICD-10-CM

## 2017-07-14 DIAGNOSIS — N179 Acute kidney failure, unspecified: Secondary | ICD-10-CM

## 2017-07-14 LAB — BASIC METABOLIC PANEL
Anion gap: 9 (ref 5–15)
BUN: 26 mg/dL — ABNORMAL HIGH (ref 6–20)
CALCIUM: 9.4 mg/dL (ref 8.9–10.3)
CO2: 24 mmol/L (ref 22–32)
CREATININE: 1.43 mg/dL — AB (ref 0.61–1.24)
Chloride: 105 mmol/L (ref 101–111)
GFR calc non Af Amer: 42 mL/min — ABNORMAL LOW (ref >60)
GFR, EST AFRICAN AMERICAN: 49 mL/min — AB (ref >60)
GLUCOSE: 113 mg/dL — AB (ref 65–99)
Potassium: 3.8 mmol/L (ref 3.5–5.1)
Sodium: 138 mmol/L (ref 135–145)

## 2017-07-14 LAB — BLOOD CULTURE ID PANEL (REFLEXED)
Acinetobacter baumannii: NOT DETECTED
CANDIDA ALBICANS: NOT DETECTED
CANDIDA GLABRATA: NOT DETECTED
CANDIDA KRUSEI: NOT DETECTED
Candida parapsilosis: NOT DETECTED
Candida tropicalis: NOT DETECTED
Carbapenem resistance: NOT DETECTED
ENTEROBACTER CLOACAE COMPLEX: NOT DETECTED
ENTEROCOCCUS SPECIES: NOT DETECTED
ESCHERICHIA COLI: DETECTED — AB
Enterobacteriaceae species: DETECTED — AB
Haemophilus influenzae: NOT DETECTED
KLEBSIELLA OXYTOCA: NOT DETECTED
KLEBSIELLA PNEUMONIAE: NOT DETECTED
Listeria monocytogenes: NOT DETECTED
NEISSERIA MENINGITIDIS: NOT DETECTED
PSEUDOMONAS AERUGINOSA: NOT DETECTED
Proteus species: NOT DETECTED
STREPTOCOCCUS AGALACTIAE: NOT DETECTED
STREPTOCOCCUS PNEUMONIAE: NOT DETECTED
STREPTOCOCCUS PYOGENES: NOT DETECTED
Serratia marcescens: NOT DETECTED
Staphylococcus aureus (BCID): NOT DETECTED
Staphylococcus species: NOT DETECTED
Streptococcus species: NOT DETECTED
Vancomycin resistance: NOT DETECTED

## 2017-07-14 LAB — CBC
HCT: 36.2 % — ABNORMAL LOW (ref 39.0–52.0)
Hemoglobin: 12 g/dL — ABNORMAL LOW (ref 13.0–17.0)
MCH: 32.1 pg (ref 26.0–34.0)
MCHC: 33.1 g/dL (ref 30.0–36.0)
MCV: 96.8 fL (ref 78.0–100.0)
PLATELETS: 193 10*3/uL (ref 150–400)
RBC: 3.74 MIL/uL — ABNORMAL LOW (ref 4.22–5.81)
RDW: 14 % (ref 11.5–15.5)
WBC: 16.8 10*3/uL — ABNORMAL HIGH (ref 4.0–10.5)

## 2017-07-14 MED ORDER — SODIUM CHLORIDE 0.9 % IV SOLN
2.0000 g | INTRAVENOUS | Status: DC
  Administered 2017-07-14 – 2017-07-15 (×2): 2 g via INTRAVENOUS
  Filled 2017-07-14 (×3): qty 20

## 2017-07-14 NOTE — Progress Notes (Signed)
Patient ID: [name], male   DOB: [birth year], 84 y.o.   MRN: [id number]  PROGRESS NOTE    Jamarrius Salay  PJA:[id number] DOB: 1925-09-11 DOA: 07/13/2017 PCP: Ria Bush, MD   Brief Narrative: (Start on day 1 of progress note - keep it brief and live) 84 year old male with chronic indwelling Foley catheter comes in with fevers and chills and gross hematuria.  To have sepsis with UTI.  Referred for admission for treatment of such.   Assessment & Plan:   Principal Problem:   Sepsis ([place]) Active Problems:   Permanent atrial fibrillation (HCC)   Chronic diastolic congestive heart failure (HCC)   Hypercholesterolemia   BPH (benign prostatic hyperplasia)   Essential hypertension   Acute lower UTI   AKI (acute kidney injury) ([place])  Sepsis ([place]) secondary to UTI  -Patient met sepsis criteria at the time of admission with fever, tachycardia, hypotension, lactic acidosis, leukocytosis, source likely due to UTI.  Follow-up on blood culture data.  Continue antibiotics. -Follow urine cultures, blood cultures, placed on IV fluid hydration, hold antihypertensives -Continue IV Rocephin, follow sensitivities -Discussed with urology, Dr. McDiarmid, urology will round  this morning., follow renal ultrasound shows renal cyst no obstruction  Active Problems:   Permanent atrial fibrillation (HCC) -Currently rate controlled, continue digoxin,  - holding antihypertensives secondary to low BP - Once BP is stable, will restart beta-blocker -Hold warfarin    Chronic diastolic congestive heart failure (HCC) -Currently appears to be dehydrated, hold Lasix, placed on gentle hydration  - follow I's and O's    Hypercholesterolemia -Continue omega-3 fatty acids daily    BPH (benign prostatic hyperplasia) - continue finasteride     Essential hypertension - BP currently low secondary to #1, hold lisinopril HCTZ, metoprolol, furosemide      DVT prophylaxis: SCDs ( Code  Status: Full  Family Communication: None  Disposition Plan: Pending urology evaluation    Consultants:   Urology  Antimicrobials:   Ceftriaxone   Subjective: Patient reports he is feeling much better  Objective: Vitals:   07/13/17 1822 07/13/17 1857 07/13/17 2048 07/14/17 0555  BP:  (!) 114/56  124/66  Pulse:  75  61  Resp:  16  16  Temp: 98.7 F (37.1 C) 98.4 F (36.9 C)  97.8 F (36.6 C)  TempSrc: Oral Oral  Oral  SpO2:  97% 100% 97%    Intake/Output Summary (Last 24 hours) at 07/14/2017 0933 Last data filed at 07/14/2017 0931 Gross per 24 hour  Intake 2391.67 ml  Output 3000 ml  Net -608.33 ml   There were no vitals filed for this visit.  Examination:  General exam: Appears calm and comfortable  Respiratory system: Clear to auscultation. Respiratory effort normal. Cardiovascular system: S1 & S2 heard, RRR. No JVD, murmurs, rubs, gallops or clicks. No pedal edema. Gastrointestinal system: Abdomen is nondistended, soft and nontender. No organomegaly or masses felt. Normal bowel sounds heard. Central nervous system: Alert and oriented. No focal neurological deficits. Extremities: Symmetric 5 x 5 power. Skin: No rashes, lesions or ulcers Psychiatry: Judgement and insight appear normal. Mood & affect appropriate.     Data Reviewed: I have personally reviewed following labs and imaging studies  CBC: Recent Labs  Lab 07/13/17 1538 07/14/17 0658  WBC 21.4* 16.8*  NEUTROABS 19.9*  --   HGB 12.7* 12.0*  HCT 38.4* 36.2*  MCV 96.0 96.8  PLT 215 341   Basic Metabolic Panel: Recent Labs  Lab 07/13/17 1538 07/14/17 9379  NA 137 138  K 3.6 3.8  CL 102 105  CO2 23 24  GLUCOSE 114* 113*  BUN 33* 26*  CREATININE 1.52* 1.43*  CALCIUM 9.9 9.4   GFR: CrCl cannot be calculated (Unknown ideal weight.). Liver Function Tests: Recent Labs  Lab 07/13/17 1538  AST 27  ALT 23  ALKPHOS 53  BILITOT 0.8  PROT 7.1  ALBUMIN 3.3*   No results for input(s):  LIPASE, AMYLASE in the last 168 hours. No results for input(s): AMMONIA in the last 168 hours. Coagulation Profile: Recent Labs  Lab 07/13/17 1914  INR 2.18   Cardiac Enzymes: No results for input(s): CKTOTAL, CKMB, CKMBINDEX, TROPONINI in the last 168 hours. BNP (last 3 results) No results for input(s): PROBNP in the last 8760 hours. HbA1C: No results for input(s): HGBA1C in the last 72 hours. CBG: No results for input(s): GLUCAP in the last 168 hours. Lipid Profile: No results for input(s): CHOL, HDL, LDLCALC, TRIG, CHOLHDL, LDLDIRECT in the last 72 hours. Thyroid Function Tests: No results for input(s): TSH, T4TOTAL, FREET4, T3FREE, THYROIDAB in the last 72 hours. Anemia Panel: No results for input(s): VITAMINB12, FOLATE, FERRITIN, TIBC, IRON, RETICCTPCT in the last 72 hours. Sepsis Labs: Recent Labs  Lab 07/13/17 1557 07/13/17 1802  LATICACIDVEN 2.13* 2.24*    Recent Results (from the past 240 hour(s))  Blood Culture (routine x 2)     Status: None (Preliminary result)   Collection Time: 07/13/17  4:24 PM  Result Value Ref Range Status   Specimen Description BLOOD RIGHT FOREARM  Final   Special Requests   Final    BOTTLES DRAWN AEROBIC AND ANAEROBIC Blood Culture adequate volume   Culture  Setup Time   Final    GRAM NEGATIVE RODS IN BOTH AEROBIC AND ANAEROBIC BOTTLES CRITICAL RESULT CALLED TO, READ BACK BY AND VERIFIED WITH: Dianna Rossetti [facility] [id number] MLM Performed at [facility], [place] [address] [place], [place] [address]    Culture GRAM NEGATIVE RODS  Final   Report Status PENDING  Incomplete  Blood Culture ID Panel (Reflexed)     Status: Abnormal   Collection Time: 07/13/17  4:24 PM  Result Value Ref Range Status   Enterococcus species NOT DETECTED NOT DETECTED Final   Vancomycin resistance NOT DETECTED NOT DETECTED Final   Listeria monocytogenes NOT DETECTED NOT DETECTED Final   Staphylococcus species NOT DETECTED NOT DETECTED Final   Staphylococcus  aureus NOT DETECTED NOT DETECTED Final   Streptococcus species NOT DETECTED NOT DETECTED Final   Streptococcus agalactiae NOT DETECTED NOT DETECTED Final   Streptococcus pneumoniae NOT DETECTED NOT DETECTED Final   Streptococcus pyogenes NOT DETECTED NOT DETECTED Final   Acinetobacter baumannii NOT DETECTED NOT DETECTED Final   Enterobacteriaceae species DETECTED (A) NOT DETECTED Final    Comment: Enterobacteriaceae represent a large family of gram-negative bacteria, not a single organism. CRITICAL RESULT CALLED TO, READ BACK BY AND VERIFIED WITH: PHARMD J LEDFORD [id number] MLM    Enterobacter cloacae complex NOT DETECTED NOT DETECTED Final   Escherichia coli DETECTED (A) NOT DETECTED Final    Comment: CRITICAL RESULT CALLED TO, READ BACK BY AND VERIFIED WITH: PHARMD J LEDFORD [id number] MLM    Klebsiella oxytoca NOT DETECTED NOT DETECTED Final   Klebsiella pneumoniae NOT DETECTED NOT DETECTED Final   Proteus species NOT DETECTED NOT DETECTED Final   Serratia marcescens NOT DETECTED NOT DETECTED Final   Carbapenem resistance NOT DETECTED NOT DETECTED Final   Haemophilus  influenzae NOT DETECTED NOT DETECTED Final   Neisseria meningitidis NOT DETECTED NOT DETECTED Final   Pseudomonas aeruginosa NOT DETECTED NOT DETECTED Final   Candida albicans NOT DETECTED NOT DETECTED Final   Candida glabrata NOT DETECTED NOT DETECTED Final   Candida krusei NOT DETECTED NOT DETECTED Final   Candida parapsilosis NOT DETECTED NOT DETECTED Final   Candida tropicalis NOT DETECTED NOT DETECTED Final    Comment: Performed at [facility], [place] [address], [place], [place] [address]         Radiology Studies: US Renal  Result Date: 07/13/2017 CLINICAL DATA:  Acute onset of renal insufficiency. EXAM: RENAL / URINARY TRACT ULTRASOUND COMPLETE COMPARISON:  None. FINDINGS: Right Kidney: Length: 10.9 cm. Echogenicity within normal limits. A mildly complex cystic lesion is noted at the lower pole of  the right kidney, measuring 5.0 x 3.6 x 3.6 cm. This contains a few septations. Additional cysts are noted at the right kidney, measuring up to 4.6 cm in size. No hydronephrosis is seen. Left Kidney: Length: 11.3 cm. Echogenicity within normal limits. Left renal cysts measure up to 1.9 cm in size. No hydronephrosis visualized. Bladder: A Foley catheter is noted within the bladder. Debris is seen dependently within the bladder. Bladder wall thickening may reflect cystitis or chronic inflammation. IMPRESSION: 1. Bladder wall thickening may reflect cystitis or chronic inflammation. Debris noted dependently within the bladder. 2. Mildly complex cystic lesion at the lower pole of the right kidney, measuring 5.0 cm. This contains a few septations. Would correlate with any prior available imaging. 3. No evidence of hydronephrosis. 4. Bilateral renal cysts noted. Electronically Signed   By: Garald Balding M.D.   On: 07/13/2017 23:31   Dg Chest Port 1 View  Result Date: 07/13/2017 CLINICAL DATA:  Fever, congestion, shortness of breath. EXAM: PORTABLE CHEST 1 VIEW COMPARISON:  Chest x-ray dated September 07, 2016. FINDINGS: The patient is rotated to the left. Mild cardiomegaly. Atherosclerotic calcification of the aortic arch. Stable linear scarring/atelectasis at the lung bases. No focal consolidation, pleural effusion, or pneumothorax. No acute osseous abnormality. IMPRESSION: No active cardiopulmonary disease. Electronically Signed   By: Titus Dubin M.D.   On: 07/13/2017 17:06        Scheduled Meds: . allopurinol  100 mg Oral Daily  . digoxin  125 mcg Oral QODAY  . finasteride  5 mg Oral Daily  . omega-3 acid ethyl esters  1 g Oral Daily   Continuous Infusions: . sodium chloride 100 mL/hr at 07/13/17 1905  . cefTRIAXone (ROCEPHIN)  IV       LOS: 1 day    Time spent: 35 minutes    Moria Brophy A, MD [facility] Pager [phone]  If 7PM-7AM, please contact  night-coverage [email or web address] Password [facility] 07/14/2017, 9:33 AM

## 2017-07-14 NOTE — Progress Notes (Signed)
PHARMACY - PHYSICIAN COMMUNICATION CRITICAL VALUE ALERT - BLOOD CULTURE IDENTIFICATION (BCID)  [name] is an 84 y.o. male who [facility] with a chief complaint of weakness, hematuria, tremors  Name of physician (or Provider) Contacted: Dr. Onalee Huaavid   Current antibiotics: Ceftriaxone 1g IV q24h  Changes to prescribed antibiotics recommended:  Increase Ceftriaxone to 2g IV q24h  Results for orders placed or performed during the hospital encounter of 07/13/17  Blood Culture ID Panel (Reflexed) (Collected: 07/13/2017  4:24 PM)  Result Value Ref Range   Enterococcus species NOT DETECTED NOT DETECTED   Vancomycin resistance NOT DETECTED NOT DETECTED   Listeria monocytogenes NOT DETECTED NOT DETECTED   Staphylococcus species NOT DETECTED NOT DETECTED   Staphylococcus aureus NOT DETECTED NOT DETECTED   Streptococcus species NOT DETECTED NOT DETECTED   Streptococcus agalactiae NOT DETECTED NOT DETECTED   Streptococcus pneumoniae NOT DETECTED NOT DETECTED   Streptococcus pyogenes NOT DETECTED NOT DETECTED   Acinetobacter baumannii NOT DETECTED NOT DETECTED   Enterobacteriaceae species DETECTED (A) NOT DETECTED   Enterobacter cloacae complex NOT DETECTED NOT DETECTED   Escherichia coli DETECTED (A) NOT DETECTED   Klebsiella oxytoca NOT DETECTED NOT DETECTED   Klebsiella pneumoniae NOT DETECTED NOT DETECTED   Proteus species NOT DETECTED NOT DETECTED   Serratia marcescens NOT DETECTED NOT DETECTED   Carbapenem resistance NOT DETECTED NOT DETECTED   Haemophilus influenzae NOT DETECTED NOT DETECTED   Neisseria meningitidis NOT DETECTED NOT DETECTED   Pseudomonas aeruginosa NOT DETECTED NOT DETECTED   Candida albicans NOT DETECTED NOT DETECTED   Candida glabrata NOT DETECTED NOT DETECTED   Candida krusei NOT DETECTED NOT DETECTED   Candida parapsilosis NOT DETECTED NOT DETECTED   Candida tropicalis NOT DETECTED NOT DETECTED    [name], [name] 07/14/2017   7:30 AM

## 2017-07-15 ENCOUNTER — Encounter (HOSPITAL_COMMUNITY): Payer: Self-pay | Admitting: *Deleted

## 2017-07-15 DIAGNOSIS — N390 Urinary tract infection, site not specified: Secondary | ICD-10-CM

## 2017-07-15 DIAGNOSIS — T83511A Infection and inflammatory reaction due to indwelling urethral catheter, initial encounter: Secondary | ICD-10-CM

## 2017-07-15 LAB — URINE CULTURE

## 2017-07-15 NOTE — Care Management Note (Addendum)
Case Management Note  Patient Details  Name: [name] MRN: [id number] Date of Birth: [birth year]  Subjective/Objective:                 Patient from home w wife, niece supportive. Has chronic foley. Coumadin for afib. Has RW at home. Will continue to follow for DC planning.    Action/Plan:  HH PT [facility]  Expected Discharge Date:  07/15/17               Expected Discharge Plan:  Home w Home Health Services  In-House Referral:     Discharge planning Services  CM Consult  Post Acute Care Choice:    Choice offered to:     DME Arranged:    DME Agency:     HH Arranged:    HH Agency:     Status of Service:  In process, will continue to follow  If discussed at Long Length of Stay Meetings, dates discussed:    Additional Comments:  Lawerance SabalDebbie Mariaeduarda Defranco, RN 07/15/2017, 3:41 PM

## 2017-07-15 NOTE — Progress Notes (Signed)
Patient ID: [name], male   DOB: October 15, 1925, 84 y.o.   MRN: [id number]  PROGRESS NOTE    [name]  KPT:[id number] DOB: [birth year] DOA: 07/13/2017 PCP: Ria Bush, MD   Brief Narrative: 84 year old male with chronic indwelling Foley catheter comes in with fevers and chills and gross hematuria.  To have sepsis with UTI.  Referred for admission for treatment of such.   Assessment & Plan:   Principal Problem:   Sepsis ([place]) Active Problems:   Permanent atrial fibrillation (HCC)   Chronic diastolic congestive heart failure (HCC)   Hypercholesterolemia   BPH (benign prostatic hyperplasia)   Essential hypertension   Acute lower UTI   AKI (acute kidney injury) ([place])   Urinary tract infection associated with indwelling urethral catheter ([place])  Sepsis ([place]) secondary to UTI  -Patient met sepsis criteria at the time of admission with fever, tachycardia, hypotension, lactic acidosis, leukocytosis, source likely due to UTI.  Blood cultures growing out gram-negative rods.  Continue  Rocephin and follow-up on sensitivities.   -Follow urine cultures, blood cultures, placed on IV fluid hydration, hold antihypertensives -Discussed with urology, Dr. McDiarmid,  appreciate urology help. -Likely patient is much improved.  Active Problems:   Permanent atrial fibrillation (HCC) -Currently rate controlled, continue digoxin,  - holding antihypertensives secondary to low BP - Once BP is stable, will restart beta-blocker -Hold warfarin    Chronic diastolic congestive heart failure (HCC) -Currently appears to be dehydrated, hold Lasix, placed on gentle hydration  - follow I's and O's    Hypercholesterolemia -Continue omega-3 fatty acids daily    BPH (benign prostatic hyperplasia) - continue finasteride     Essential hypertension - BP currently low secondary to #1, hold lisinopril HCTZ, metoprolol, furosemide      DVT prophylaxis: SCDs  Code Status: Full    Family Communication: None  Disposition Plan: Pending sensitivities of positive blood cultures  Consultants:   Urology  Antimicrobials:   Ceftriaxone   Subjective: Patient reports he is feeling much better  Objective: Vitals:   07/14/17 1539 07/14/17 2047 07/15/17 0523 07/15/17 1402  BP: 122/61 139/65 (!) 173/85 (!) 132/57  Pulse: 61 (!) 52 83 71  Resp: 18 19 20 18   Temp: 98.8 F (37.1 C) 99.4 F (37.4 C) 99.5 F (37.5 C) 98.9 F (37.2 C)  TempSrc: Oral Oral Oral Oral  SpO2: 99% 96% 99% 97%  Height:    6' (1.829 m)    Intake/Output Summary (Last 24 hours) at 07/15/2017 1449 Last data filed at 07/15/2017 0917 Gross per 24 hour  Intake 1800 ml  Output 1650 ml  Net 150 ml   There were no vitals filed for this visit.  Examination:  General exam: Appears calm and comfortable  Respiratory system: Clear to auscultation. Respiratory effort normal. Cardiovascular system: S1 & S2 heard, RRR. No JVD, murmurs, rubs, gallops or clicks. No pedal edema. Gastrointestinal system: Abdomen is nondistended, soft and nontender. No organomegaly or masses felt. Normal bowel sounds heard. Central nervous system: Alert and oriented. No focal neurological deficits. Extremities: Symmetric 5 x 5 power. Skin: No rashes, lesions or ulcers Psychiatry: Judgement and insight appear normal. Mood & affect appropriate.     Data Reviewed: I have personally reviewed following labs and imaging studies  CBC: Recent Labs  Lab 07/13/17 1538 07/14/17 0658  WBC 21.4* 16.8*  NEUTROABS 19.9*  --   HGB 12.7* 12.0*  HCT 38.4* 36.2*  MCV 96.0 96.8  PLT 215 193   Basic  Metabolic Panel: Recent Labs  Lab 07/13/17 1538 07/14/17 0658  NA 137 138  K 3.6 3.8  CL 102 105  CO2 23 24  GLUCOSE 114* 113*  BUN 33* 26*  CREATININE 1.52* 1.43*  CALCIUM 9.9 9.4   GFR: CrCl cannot be calculated (Unknown ideal weight.). Liver Function Tests: Recent Labs  Lab 07/13/17 1538  AST 27  ALT 23  ALKPHOS  53  BILITOT 0.8  PROT 7.1  ALBUMIN 3.3*   No results for input(s): LIPASE, AMYLASE in the last 168 hours. No results for input(s): AMMONIA in the last 168 hours. Coagulation Profile: Recent Labs  Lab 07/13/17 1914  INR 2.18   Cardiac Enzymes: No results for input(s): CKTOTAL, CKMB, CKMBINDEX, TROPONINI in the last 168 hours. BNP (last 3 results) No results for input(s): PROBNP in the last 8760 hours. HbA1C: No results for input(s): HGBA1C in the last 72 hours. CBG: No results for input(s): GLUCAP in the last 168 hours. Lipid Profile: No results for input(s): CHOL, HDL, LDLCALC, TRIG, CHOLHDL, LDLDIRECT in the last 72 hours. Thyroid Function Tests: No results for input(s): TSH, T4TOTAL, FREET4, T3FREE, THYROIDAB in the last 72 hours. Anemia Panel: No results for input(s): VITAMINB12, FOLATE, FERRITIN, TIBC, IRON, RETICCTPCT in the last 72 hours. Sepsis Labs: Recent Labs  Lab 07/13/17 1557 07/13/17 1802  LATICACIDVEN 2.13* 2.24*    Recent Results (from the past 240 hour(s))  Urine culture     Status: Abnormal   Collection Time: 07/13/17 12:54 PM  Result Value Ref Range Status   Specimen Description URINE, CATHETERIZED  Final   Special Requests   Final    NONE Performed at [facility], [address], [place], [place] [address]    Culture MULTIPLE SPECIES PRESENT, SUGGEST RECOLLECTION (A)  Final   Report Status 07/15/2017 FINAL  Final  Blood Culture (routine x 2)     Status: None (Preliminary result)   Collection Time: 07/13/17  3:38 PM  Result Value Ref Range Status   Specimen Description BLOOD LEFT HAND  Final   Special Requests   Final    BOTTLES DRAWN AEROBIC AND ANAEROBIC Blood Culture results may not be optimal due to an inadequate volume of blood received in culture bottles   Culture  Setup Time   Final    GRAM NEGATIVE RODS AEROBIC BOTTLE ONLY CRITICAL RESULT CALLED TO, READ BACK BY AND VERIFIED WITH: PHARMD E MARTIN 07/14/17 AT 1030 BY CM     Culture   Final    GRAM NEGATIVE RODS CULTURE REINCUBATED FOR BETTER GROWTH Performed at [facility], [place] [address] [place], [place] [address]    Report Status PENDING  Incomplete  Blood Culture (routine x 2)     Status: None (Preliminary result)   Collection Time: 07/13/17  4:24 PM  Result Value Ref Range Status   Specimen Description BLOOD RIGHT FOREARM  Final   Special Requests   Final    BOTTLES DRAWN AEROBIC AND ANAEROBIC Blood Culture adequate volume   Culture  Setup Time   Final    GRAM NEGATIVE RODS IN BOTH AEROBIC AND ANAEROBIC BOTTLES CRITICAL RESULT CALLED TO, READ BACK BY AND VERIFIED WITH: Dianna Rossetti [facility] [id number] MLM    Culture   Final    GRAM NEGATIVE RODS CULTURE REINCUBATED FOR BETTER GROWTH Performed at [facility], [place] [address], [place], [place] [address]    Report Status PENDING  Incomplete  Blood Culture ID Panel (Reflexed)  Status: Abnormal   Collection Time: 07/13/17  4:24 PM  Result Value Ref Range Status   Enterococcus species NOT DETECTED NOT DETECTED Final   Vancomycin resistance NOT DETECTED NOT DETECTED Final   Listeria monocytogenes NOT DETECTED NOT DETECTED Final   Staphylococcus species NOT DETECTED NOT DETECTED Final   Staphylococcus aureus NOT DETECTED NOT DETECTED Final   Streptococcus species NOT DETECTED NOT DETECTED Final   Streptococcus agalactiae NOT DETECTED NOT DETECTED Final   Streptococcus pneumoniae NOT DETECTED NOT DETECTED Final   Streptococcus pyogenes NOT DETECTED NOT DETECTED Final   Acinetobacter baumannii NOT DETECTED NOT DETECTED Final   Enterobacteriaceae species DETECTED (A) NOT DETECTED Final    Comment: Enterobacteriaceae represent a large family of gram-negative bacteria, not a single organism. CRITICAL RESULT CALLED TO, READ BACK BY AND VERIFIED WITH: PHARMD J LEDFORD [id number] MLM    Enterobacter cloacae complex NOT DETECTED NOT DETECTED Final   Escherichia coli DETECTED (A) NOT DETECTED  Final    Comment: CRITICAL RESULT CALLED TO, READ BACK BY AND VERIFIED WITH: PHARMD J LEDFORD [id number] MLM    Klebsiella oxytoca NOT DETECTED NOT DETECTED Final   Klebsiella pneumoniae NOT DETECTED NOT DETECTED Final   Proteus species NOT DETECTED NOT DETECTED Final   Serratia marcescens NOT DETECTED NOT DETECTED Final   Carbapenem resistance NOT DETECTED NOT DETECTED Final   Haemophilus influenzae NOT DETECTED NOT DETECTED Final   Neisseria meningitidis NOT DETECTED NOT DETECTED Final   Pseudomonas aeruginosa NOT DETECTED NOT DETECTED Final   Candida albicans NOT DETECTED NOT DETECTED Final   Candida glabrata NOT DETECTED NOT DETECTED Final   Candida krusei NOT DETECTED NOT DETECTED Final   Candida parapsilosis NOT DETECTED NOT DETECTED Final   Candida tropicalis NOT DETECTED NOT DETECTED Final    Comment: Performed at [facility], [place] [address] [place], [place] [address]         Radiology Studies: US Renal  Result Date: 07/13/2017 CLINICAL DATA:  Acute onset of renal insufficiency. EXAM: RENAL / URINARY TRACT ULTRASOUND COMPLETE COMPARISON:  None. FINDINGS: Right Kidney: Length: 10.9 cm. Echogenicity within normal limits. A mildly complex cystic lesion is noted at the lower pole of the right kidney, measuring 5.0 x 3.6 x 3.6 cm. This contains a few septations. Additional cysts are noted at the right kidney, measuring up to 4.6 cm in size. No hydronephrosis is seen. Left Kidney: Length: 11.3 cm. Echogenicity within normal limits. Left renal cysts measure up to 1.9 cm in size. No hydronephrosis visualized. Bladder: A Foley catheter is noted within the bladder. Debris is seen dependently within the bladder. Bladder wall thickening may reflect cystitis or chronic inflammation. IMPRESSION: 1. Bladder wall thickening may reflect cystitis or chronic inflammation. Debris noted dependently within the bladder. 2. Mildly complex cystic lesion at the lower pole of the right kidney,  measuring 5.0 cm. This contains a few septations. Would correlate with any prior available imaging. 3. No evidence of hydronephrosis. 4. Bilateral renal cysts noted. Electronically Signed   By: Garald Balding M.D.   On: 07/13/2017 23:31   Dg Chest Port 1 View  Result Date: 07/13/2017 CLINICAL DATA:  Fever, congestion, shortness of breath. EXAM: PORTABLE CHEST 1 VIEW COMPARISON:  Chest x-ray dated September 07, 2016. FINDINGS: The patient is rotated to the left. Mild cardiomegaly. Atherosclerotic calcification of the aortic arch. Stable linear scarring/atelectasis at the lung bases. No focal consolidation, pleural effusion, or pneumothorax. No acute osseous abnormality. IMPRESSION: No active cardiopulmonary  disease. Electronically Signed   By: Titus Dubin M.D.   On: 07/13/2017 17:06        Scheduled Meds: . allopurinol  100 mg Oral Daily  . digoxin  125 mcg Oral QODAY  . finasteride  5 mg Oral Daily  . omega-3 acid ethyl esters  1 g Oral Daily   Continuous Infusions: . cefTRIAXone (ROCEPHIN)  IV Stopped (07/14/17 1725)     LOS: 2 days    Time spent: 34 minutes    DAVID,RACHAL A, MD [facility] Pager [phone]  If 7PM-7AM, please contact night-coverage [email or web address] Password [facility] 07/15/2017, 2:49 PM

## 2017-07-15 NOTE — Care Management Important Message (Signed)
Important Message  Patient Details  Name: Ladean RayaCharles Lloyd Raczkowski MRN: [id number] Date of Birth: [birth year]   Medicare Important Message Given:  Yes    Dorena BodoIris Leanne Sisler 07/15/2017, 4:08 PM

## 2017-07-16 LAB — PROTIME-INR
INR: 1.42
PROTHROMBIN TIME: 17.3 s — AB (ref 11.4–15.2)

## 2017-07-16 MED ORDER — LIDOCAINE HCL URETHRAL/MUCOSAL 2 % EX GEL
1.0000 "application " | Freq: Once | CUTANEOUS | Status: AC
  Administered 2017-07-16: 1 via URETHRAL
  Filled 2017-07-16: qty 5

## 2017-07-16 MED ORDER — WARFARIN SODIUM 5 MG PO TABS
5.0000 mg | ORAL_TABLET | Freq: Once | ORAL | Status: AC
  Administered 2017-07-16: 5 mg via ORAL
  Filled 2017-07-16: qty 1

## 2017-07-16 MED ORDER — WARFARIN - PHARMACIST DOSING INPATIENT: Freq: Every day | Status: DC

## 2017-07-16 MED ORDER — CEFDINIR 300 MG PO CAPS: 300.0000 mg | ORAL_CAPSULE | Freq: Two times a day (BID) | ORAL | 0 refills | Status: DC

## 2017-07-16 NOTE — Progress Notes (Signed)
ANTICOAGULATION CONSULT NOTE - Initial Consult  Pharmacy Consult for Warfarin Indication: stroke history   Allergies  Allergen Reactions  . Benazepril Hcl Other (See Comments)    Lethargic, nervous, nauseated    Patient Measurements: Height: 6' (182.9 cm) IBW/kg (Calculated) : 77.6   Vital Signs: Temp: 98.8 F (37.1 C) (06/08 0800) Temp Source: Oral (06/08 0800) BP: 136/82 (06/08 0800) Pulse Rate: 95 (06/08 0500)  Labs: Recent Labs    07/13/17 1538 07/13/17 1914 07/14/17 0658  HGB 12.7*  --  12.0*  HCT 38.4*  --  36.2*  PLT 215  --  193  LABPROT  --  24.1*  --   INR  --  2.18  --   CREATININE 1.52*  --  1.43*    CrCl cannot be calculated (Unknown ideal weight.).   Medical History: Past Medical History:  Diagnosis Date  . Atrial fibrillation (HCC)    chronic  . CAD (coronary artery disease)    s/p MI, unsure when  . CHF (congestive heart failure) (HCC)   . Cognitive impairment    from stroke  . 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 DOE (dyspnea on exertion)   . Gout   . HTN (hypertension)   . Hyperlipidemia      Assessment: Pharmacy consulted to resume warfarin dosing for patient with history of CVA. Patient presented with hematuria and warfarin had been on hold since 5/30. Most recent anticoagulation clinic note states dosing was 7.5mg  daily except 10mg  on Wednesdays. Patient last reported dose on 5/30. Due to recent hematuria and warfarin hold, will resume dosing conservatively and recommend patient follow up with anticoagulation clinic as soon as possible upon discharge.   Baseline INR on admission reported as 2.18. Hgb and platelets are stable.   Goal of Therapy:  INR 2-3 Monitor platelets by anticoagulation protocol: Yes   Plan:  Warfarin 5mg  PO x 1 dose  Stat INR today, daily if remains in hospital  Plan for discharge today, will time warfarin dose early   Blake DivineShannon Robey Massmann,  Pharm.D. PGY1 Pharmacy Resident 07/16/2017 10:20 AM Main Pharmacy: [phone]

## 2017-07-16 NOTE — Discharge Summary (Signed)
Physician Discharge Summary  [name] YIF:[id number] DOB: Aug 14, 1925 DOA: 07/13/2017  PCP: Ria Bush, MD  Admit date: 07/13/2017 Discharge date: 07/16/2017  Time spent: 32 minutes  Recommendations for Outpatient Follow-up:  PCP follow-up in 1 week  Follow-up final sensitivities of blood cultures which are growing out gram-negative rods clinically responding to Rocephin  Discharge Diagnoses:  Principal Problem:   Sepsis ([place]) Active Problems:   Permanent atrial fibrillation (HCC)   Chronic diastolic congestive heart failure (HCC)   Hypercholesterolemia   BPH (benign prostatic hyperplasia)   Essential hypertension   Acute lower UTI   AKI (acute kidney injury) ([place])   Urinary tract infection associated with indwelling urethral catheter ([place])   Discharge Condition: Stable and improved  Diet recommendation: Cardiac Coumadin diet  There were no vitals filed for this visit. Afebrile vital signs stable  History of present illness:  Pleasant 84 year old male who presented with encephalopathy secondary to sepsis from UTI.  Patient's blood cultures were growing out gram-negative rods which appear to be E. coli.  Patient is markedly improved with IV Rocephin.  Patient and family did not want to wait any further for sensitivities to his blood cultures.  He has been afebrile and has been doing remarkably well for over 48 hours on Rocephin.  Will transition patient to [facility] for another 2 weeks.  I instructed the family and patient that if he were to have any fevers or any worsening symptoms to return to the emergency department.  Otherwise he can follow-up with his primary care physician in approximately 1 week.  Hospital Course:  Sepsis (HCC)secondary to UTI  -Patient met sepsis criteria at the time of admission with fever, tachycardia, hypotension, lactic acidosis, leukocytosis, source likely due to UTI.  Blood cultures growing out gram-negative rods.    Received Rocephin  for 3 days we will transition to oral Omnicef for 2 weeks --Urology consulted who did not recommend any further work-up  Active Problems: Permanent atrial fibrillation (HCC) -Currently rate controlled, continue digoxin, -Resume blood pressure meds and warfarin at discharge -Coumadin initially held due to gross hematuria which is resolved  Chronic diastolic congestive heart failure (HCC) -Stable and compensated during his hospitalization  hercholesterolemia -Continue omega-78ftty acidsdaily  BPH (benign prostatic hyperplasia) - continue finasteride  Essential hypertension -Resume blood pressure meds at discharge   Discharge Exam: Vitals:   07/16/17 0500 07/16/17 0800  BP: (!) 152/99 136/82  Pulse: 95   Resp: 19   Temp: 100.2 F (37.9 C) 98.8 F (37.1 C)  SpO2: 95% 94%    General: Alert and oriented x4 no apparent distress very cooperative and friendly Cardiovascular: Rate and rhythm without murmurs rubs gallops Respiratory: Clear to auscultation bilaterally no wheezes rhonchi rales  Discharge Instructions   Discharge Instructions    Diet - low sodium heart healthy   Complete by:  As directed    Increase activity slowly   Complete by:  As directed      Allergies as of 07/16/2017      Reactions   Benazepril Hcl Other (See Comments)   Lethargic, nervous, nauseated      Medication List    STOP taking these medications   levofloxacin 750 MG tablet Commonly known as:  LEVAQUIN     TAKE these medications   allopurinol 100 MG tablet Commonly known as:  ZYLOPRIM Take 1 tablet (100 mg total) by mouth daily.   aspirin EC 81 MG tablet Take 81 mg by mouth daily.   cefdinir 300  MG capsule Commonly known as:  OMNICEF Take 1 capsule (300 mg total) by mouth 2 (two) times daily.   digoxin 0.125 MG tablet Commonly known as:  LANOXIN Take 1 tablet (125 mcg total) by mouth every other day. What changed:  when to take this   finasteride 5 MG  tablet Commonly known as:  PROSCAR Take 5 mg by mouth daily.   Fish Oil 1000 MG Caps Take 1,000 mg by mouth daily.   furosemide 40 MG tablet Commonly known as:  LASIX TAKE TWO TABLETS BY MOUTH ONCE DAILY IN THE MORNING   lisinopril-hydrochlorothiazide 20-12.5 MG tablet Commonly known as:  PRINZIDE,ZESTORETIC TAKE ONE TABLET BY MOUTH IN THE MORNING   metoprolol succinate 25 MG 24 hr tablet Commonly known as:  TOPROL-XL TAKE ONE-HALF TABLET BY MOUTH ONCE DAILY What changed:    how much to take  how to take this  when to take this  additional instructions   nitroGLYCERIN 0.4 MG SL tablet Commonly known as:  NITROSTAT Place 0.4 mg under the tongue every 5 (five) minutes as needed (for chest pain).   warfarin 5 MG tablet Commonly known as:  COUMADIN Take as directed. If you are unsure how to take this medication, talk to your nurse or doctor. Original instructions:  TAKE 1 TABLET BY MOUTH ONCE DAILY AS DIRECTED BY  COUMADIN  CLINIC      Allergies  Allergen Reactions  . Benazepril Hcl Other (See Comments)    Lethargic, nervous, nauseated   Follow-up Information    Ria Bush, MD Follow up in 1 week(s).   Specialty:  Family Medicine Contact information: [place] [address] [phone]        Nahser, Wonda Cheng, MD .   Specialty:  Cardiology Contact information: [place] [address] [phone]        [facility] Follow up.   Specialty:  [place] Why:  For home PT RN they will contact you in the next 1-2 days to set up your first appointment Contact information: [address] [place]  [address] [phone]            The results of significant diagnostics from this hospitalization (including imaging, microbiology, ancillary and laboratory) are listed below for reference.    Significant Diagnostic Studies: US Renal  Result Date:  08/11/17 CLINICAL DATA:  Acute onset of renal insufficiency. EXAM: RENAL / URINARY TRACT ULTRASOUND COMPLETE COMPARISON:  None. FINDINGS: Right Kidney: Length: 10.9 cm. Echogenicity within normal limits. A mildly complex cystic lesion is noted at the lower pole of the right kidney, measuring 5.0 x 3.6 x 3.6 cm. This contains a few septations. Additional cysts are noted at the right kidney, measuring up to 4.6 cm in size. No hydronephrosis is seen. Left Kidney: Length: 11.3 cm. Echogenicity within normal limits. Left renal cysts measure up to 1.9 cm in size. No hydronephrosis visualized. Bladder: A Foley catheter is noted within the bladder. Debris is seen dependently within the bladder. Bladder wall thickening may reflect cystitis or chronic inflammation. IMPRESSION: 1. Bladder wall thickening may reflect cystitis or chronic inflammation. Debris noted dependently within the bladder. 2. Mildly complex cystic lesion at the lower pole of the right kidney, measuring 5.0 cm. This contains a few septations. Would correlate with any prior available imaging. 3. No evidence of hydronephrosis. 4. Bilateral renal cysts noted. Electronically Signed   By: Garald Balding M.D.   On:  07/13/2017 23:31   Dg Chest Port 1 View  Result Date: 07/13/2017 CLINICAL DATA:  Fever, congestion, shortness of breath. EXAM: PORTABLE CHEST 1 VIEW COMPARISON:  Chest x-ray dated September 07, 2016. FINDINGS: The patient is rotated to the left. Mild cardiomegaly. Atherosclerotic calcification of the aortic arch. Stable linear scarring/atelectasis at the lung bases. No focal consolidation, pleural effusion, or pneumothorax. No acute osseous abnormality. IMPRESSION: No active cardiopulmonary disease. Electronically Signed   By: Titus Dubin M.D.   On: 07/13/2017 17:06    Microbiology: Recent Results (from the past 240 hour(s))  Urine culture     Status: Abnormal   Collection Time: 07/13/17 12:54 PM  Result Value Ref Range Status   Specimen  Description URINE, CATHETERIZED  Final   Special Requests   Final    NONE Performed at [facility], [address], [place], [place] [address]    Culture MULTIPLE SPECIES PRESENT, SUGGEST RECOLLECTION (A)  Final   Report Status 07/15/2017 FINAL  Final  Blood Culture (routine x 2)     Status: Abnormal (Preliminary result)   Collection Time: 07/13/17  3:38 PM  Result Value Ref Range Status   Specimen Description BLOOD LEFT HAND  Final   Special Requests   Final    BOTTLES DRAWN AEROBIC AND ANAEROBIC Blood Culture results may not be optimal due to an inadequate volume of blood received in culture bottles   Culture  Setup Time   Final    GRAM NEGATIVE RODS AEROBIC BOTTLE ONLY CRITICAL RESULT CALLED TO, READ BACK BY AND VERIFIED WITH: PHARMD E MARTIN 07/14/17 AT 1030 BY CM    Culture (A)  Final    PSEUDOMONAS AERUGINOSA SUSCEPTIBILITIES TO FOLLOW GRAM NEGATIVE RODS IDENTIFICATION AND SUSCEPTIBILITIES TO FOLLOW Performed at [facility], [place] [address], [place], [place] [address]    Report Status PENDING  Incomplete  Blood Culture (routine x 2)     Status: Abnormal (Preliminary result)   Collection Time: 07/13/17  4:24 PM  Result Value Ref Range Status   Specimen Description BLOOD RIGHT FOREARM  Final   Special Requests   Final    BOTTLES DRAWN AEROBIC AND ANAEROBIC Blood Culture adequate volume   Culture  Setup Time   Final    GRAM NEGATIVE RODS IN BOTH AEROBIC AND ANAEROBIC BOTTLES CRITICAL RESULT CALLED TO, READ BACK BY AND VERIFIED WITH: PHARMD J LEDFORD [id number] MLM    Culture (A)  Final    PSEUDOMONAS AERUGINOSA KLEBSIELLA ORNITHINOLYTICA SUSCEPTIBILITIES TO FOLLOW GRAM NEGATIVE RODS IDENTIFICATION AND SUSCEPTIBILITIES TO FOLLOW Performed at [facility], [place] [address], [place], [place] [address]    Report Status PENDING  Incomplete  Blood Culture ID Panel (Reflexed)     Status: Abnormal   Collection Time: 07/13/17  4:24 PM  Result Value Ref Range  Status   Enterococcus species NOT DETECTED NOT DETECTED Final   Vancomycin resistance NOT DETECTED NOT DETECTED Final   Listeria monocytogenes NOT DETECTED NOT DETECTED Final   Staphylococcus species NOT DETECTED NOT DETECTED Final   Staphylococcus aureus NOT DETECTED NOT DETECTED Final   Streptococcus species NOT DETECTED NOT DETECTED Final   Streptococcus agalactiae NOT DETECTED NOT DETECTED Final   Streptococcus pneumoniae NOT DETECTED NOT DETECTED Final   Streptococcus pyogenes NOT DETECTED NOT DETECTED Final   Acinetobacter baumannii NOT DETECTED NOT DETECTED Final   Enterobacteriaceae species DETECTED (A) NOT DETECTED Final    Comment: Enterobacteriaceae represent a large family of gram-negative bacteria,  not a single organism. CRITICAL RESULT CALLED TO, READ BACK BY AND VERIFIED WITH: PHARMD J LEDFORD [id number] MLM    Enterobacter cloacae complex NOT DETECTED NOT DETECTED Final   Escherichia coli DETECTED (A) NOT DETECTED Final    Comment: CRITICAL RESULT CALLED TO, READ BACK BY AND VERIFIED WITH: PHARMD J LEDFORD [id number] 0716 MLM    Klebsiella oxytoca NOT DETECTED NOT DETECTED Final   Klebsiella pneumoniae NOT DETECTED NOT DETECTED Final   Proteus species NOT DETECTED NOT DETECTED Final   Serratia marcescens NOT DETECTED NOT DETECTED Final   Carbapenem resistance NOT DETECTED NOT DETECTED Final   Haemophilus influenzae NOT DETECTED NOT DETECTED Final   Neisseria meningitidis NOT DETECTED NOT DETECTED Final   Pseudomonas aeruginosa NOT DETECTED NOT DETECTED Final   Candida albicans NOT DETECTED NOT DETECTED Final   Candida glabrata NOT DETECTED NOT DETECTED Final   Candida krusei NOT DETECTED NOT DETECTED Final   Candida parapsilosis NOT DETECTED NOT DETECTED Final   Candida tropicalis NOT DETECTED NOT DETECTED Final    Comment: Performed at [facility], [place] [address], [place], [place] [address]     Labs: Basic Metabolic Panel: Recent Labs  Lab 07/13/17 1538  07/14/17 0658  NA 137 138  K 3.6 3.8  CL 102 105  CO2 23 24  GLUCOSE 114* 113*  BUN 33* 26*  CREATININE 1.52* 1.43*  CALCIUM 9.9 9.4   Liver Function Tests: Recent Labs  Lab 07/13/17 1538  AST 27  ALT 23  ALKPHOS 53  BILITOT 0.8  PROT 7.1  ALBUMIN 3.3*   No results for input(s): LIPASE, AMYLASE in the last 168 hours. No results for input(s): AMMONIA in the last 168 hours. CBC: Recent Labs  Lab 07/13/17 1538 07/14/17 0658  WBC 21.4* 16.8*  NEUTROABS 19.9*  --   HGB 12.7* 12.0*  HCT 38.4* 36.2*  MCV 96.0 96.8  PLT 215 193   Cardiac Enzymes: No results for input(s): CKTOTAL, CKMB, CKMBINDEX, TROPONINI in the last 168 hours. BNP: BNP (last 3 results) No results for input(s): BNP in the last 8760 hours.  ProBNP (last 3 results) No results for input(s): PROBNP in the last 8760 hours.  CBG: No results for input(s): GLUCAP in the last 168 hours.     Signed:  Phillips Grout MD.  [facility] 07/16/2017, 2:19 PM

## 2017-07-17 LAB — CULTURE, BLOOD (ROUTINE X 2): Special Requests: ADEQUATE

## 2017-07-18 ENCOUNTER — Encounter (HOSPITAL_COMMUNITY): Payer: Self-pay | Admitting: Pharmacist

## 2017-07-18 ENCOUNTER — Telehealth: Payer: Self-pay | Admitting: Family Medicine

## 2017-07-18 MED ORDER — LEVOFLOXACIN 750 MG PO TABS: 750.0000 mg | ORAL_TABLET | ORAL | 0 refills | Status: DC

## 2017-07-18 NOTE — Telephone Encounter (Signed)
BlCx positive for:  PSEUDOMONAS AERUGINOSA  KLEBSIELLA ORNITHINOLYTICA  ESCHERICHIA COLI  plz call wife - how is patient responding to [facility]? Let me know if not doing well.  Otherwise, I will touch base with pharmacy at the hospital to see if we need to switch antibiotic and will call her tomorrow with an update.

## 2017-07-18 NOTE — Telephone Encounter (Signed)
Spoke with wife. He is taking cefdinir 300mg  bid - discharged from hospital with this medicine.  Advised to take one more tonight then stop and start in its place levaquin 750mg  QOD.  He is currently not on coumadin.  Misty StanleyLisa - plz call tomorrow and schedule hosp f/u visit on Friday 3:30pm.

## 2017-07-18 NOTE — Telephone Encounter (Signed)
Copied from CRM [phone]. Topic: Quick Communication - See Telephone Encounter >> Jul 18, 2017  1:47 PM Cipriano BunkerLambe, Annette S wrote: CRM for notification. See Telephone encounter for: 07/18/17.  Melvenia BeamJeromy Frens - [facility] - clinical pharmacist [phone]- [phone] reviewed the blood cultures and is asking if Dr. Sharen HonesGutierrez if he could call in a different antibiotic. He recommends Levofloxacine   He is asking if Dr. Sharen HonesGutierrez could call

## 2017-07-18 NOTE — Telephone Encounter (Signed)
Spoke with pt's wife, Joellyn Ruedrma, asking how pt was doing on abx.  States pt was not sent home with any meds that she is aware of. And she does not remember seeing a printed rx in paperwork.

## 2017-07-18 NOTE — Telephone Encounter (Signed)
I spoke with [facility]remy at [facility] and Riki RuskJeremy does not know what pharmacy pt would want rx sent to. Riki RuskJeremy does not require cb unless Dr Reece AgarG needs to speak with him. Riki RuskJeremy reviews cultures and that prompted the call. If need to speak with Riki RuskJeremy call [phone]. Only phamracy on pts profile is [facility].

## 2017-07-19 ENCOUNTER — Telehealth: Payer: Self-pay | Admitting: Cardiovascular Disease

## 2017-07-19 ENCOUNTER — Telehealth: Payer: Self-pay

## 2017-07-19 ENCOUNTER — Telehealth: Payer: Self-pay | Admitting: Family Medicine

## 2017-07-19 LAB — CULTURE, BLOOD (ROUTINE X 2)

## 2017-07-19 NOTE — Telephone Encounter (Signed)
Contacted home health nurse to clarify information below:  [facility], patient is experience +2 pitting edema to Bilateral Lower Extremities.   I have LM for Cory to call back and let us know if patient has any other signs of fluid retention or is having any acute increase in SOB.  Will forward this on to Dr. Reece AgarG.

## 2017-07-19 NOTE — Telephone Encounter (Signed)
Transition Care Management Follow-up Telephone Call   Date discharged?07/16/17  Note: discussed this call with wife, okay per DPR   How have you been since you were released from the hospital? "He is doing better"   Do you understand why you were in the hospital? Yes   Do you understand the discharge instructions? No   Where were you discharged to? Home with Home Health   Items Reviewed:  Medications reviewed: Yes  Allergies reviewed: Yes  Dietary changes reviewed: No changes  Referrals reviewed: Yes, appointments with Cardiology on 6/13 and PCP on 6/14.  Also, home health has been out to the house and in process of setting up services.     Functional Questionnaire:   Activities of Daily Living (ADLs):   He states they are independent in the following: Toileting, feeding, bathing after set up assistance. States they require assistance with the following: dressing/grooming assistance, ambulates ad lib with a walker and uses a wheelchair for long distances as needed.     Any transportation issues/concerns?: No, wife drives.   Any patient concerns? No   Confirmed importance and date/time of follow-up visits scheduled Yes  Provider Appointment booked with Dr. Sharen HonesGutierrez for 07/22/16 at 3:00pm  Confirmed with patient if condition begins to worsen call PCP or go to the ER.  Patient was given the office number and encouraged to call back with question or concerns.  : Yes.

## 2017-07-19 NOTE — Telephone Encounter (Signed)
Spoke with [name] and read from his discharge instruction sheet from the hospital that he was to restart coumadin on discharge He had coumadin held  while in hospital due to gross hematuria On speaking with [name] she states he went to urologist about week before admission to hospital on 07/13/2017 to 07/16/2017 and she states the urologist stopped coumadin due to gross hematuria On discharge he was to continue Omnicef 300 mg bid for 2 weeks  On further results from lab Omnicef discontinued and started on levaquin 750mg  every other day and this was started on 07/18/2017 On speaking with [name] she states Mrs [name] is not able to drive and [name] states she will not be able to bring them for INR check until Wednesday Stressed to her to have him restart coumadin today and take 1 and 1/2 tablets (7.5mg ) daily as this was the dose he was on and that there in an interaction between coumadin and levaquin so very important to monitor for any bleeding or any problems and call with concerns Also instructed to have pt eat some dark leafy greens on Sunday and Tuesday Appt made for both [name] to have INE checked on Wednesday June 19th  [name] also states he has not had any more bleeding  [name] states understanding of instructions given

## 2017-07-19 NOTE — Telephone Encounter (Signed)
Copied from CRM [id number]. Topic: Quick Communication - See Telephone Encounter >> Jul 19, 2017  3:27 PM Nanci PinaGoins, Felicia, CMA wrote: CRM for notification. See Telephone encounter for: 07/18/17.  [facility] may schedule 30 min hospital f/u when pt's wife, [name], calls back.  Message: Spoke with pt's wife, [name], but she needed to quickly end call. Offered (per Dr. Reece AgarG) either Thus, 6/13 @ 11-11:30 AM or Fri, 6/14 @ 3-3:30 PM for hospital f/u. Pt's wife, Joellyn Ruedrma will call back to let us know which appt. >> Jul 19, 2017  3:55 PM Cipriano BunkerLambe, Annette S wrote: Pt. Wife called back and wants the Friday appt. At 3:00 pm [facility]   If problem call wife back.

## 2017-07-19 NOTE — Telephone Encounter (Signed)
Spoke with patient's wife during TCM call.  Appointment made for 3pm on 07/22/17.  Thanks.

## 2017-07-19 NOTE — Telephone Encounter (Signed)
Spoke with pt's wife, Joellyn Ruedrma, to schedule for hospital f/u. Offered either (per Dr. Reece AgarG)  Thus, 6/13 @ 11-11:30 AM or Fri, 6/14 @ 3-3:30 PM.  Says she will have to call right back to let us know.

## 2017-07-19 NOTE — Telephone Encounter (Signed)
Cori calls back to report that patient is also having crackles in his lung bases and appears SOB to her; however, wife states that this is his baseline.

## 2017-07-19 NOTE — Telephone Encounter (Signed)
Clarification INR checked on Wednesday June

## 2017-07-19 NOTE — Telephone Encounter (Signed)
Copied from CRM [id number]. Topic: Quick Communication - See Telephone Encounter >> Jul 19, 2017 12:43 PM Raquel SarnaHayes, Teresa G wrote: Kandee Keenory w/ [facility] [phone]- [phone]  Verbal Orders for nursing:  2 times a week for 3 weeks 1 time a week for 6 weeks  Orders to change pt's foley once a month  Pt has 2 Edemas in lower extremdities

## 2017-07-19 NOTE — Telephone Encounter (Signed)
New Message:      Pt's niece is calling and states the pt would like to be on a different blood thinner instead of the coumadin.

## 2017-07-19 NOTE — Telephone Encounter (Signed)
Spoke with patient's niece and gave her information on alternative blood thinning medications other than coumadin. I advised her to contact their insurance company for cost information and then to let us know how they would like to proceed. Patient has CVRR appointment on 6/19; niece states she will either call me back or will discuss with pharmacist at that appointment. She thanked me for my help.

## 2017-07-19 NOTE — Telephone Encounter (Signed)
INR checked on Wednesday June 19th

## 2017-07-19 NOTE — Telephone Encounter (Signed)
New Message    If Home Health RN is calling please get Coumadin Nurse on the phone STAT  1.  Are you calling in regards to an appointment? no  2.  Are you calling for a refill ? no  3.  Are you having bleeding issues? no  4.  Do you need clearance to hold Coumadin? No  Patient niece Albin FellingCarla is calling on behalf of patient. He was recently in the hospital and they advised him to stop his coumadin. They did not advise how long to hold the coumadin. Please call to discuss.     Please route to the Coumadin Clinic Pool

## 2017-07-19 NOTE — Telephone Encounter (Signed)
[facility] including foley change once a month. Thanks.  Ensure taking lasix 40mg  2 tab in am as on discharge summary.  As long as no worsening dyspnea, recommend they check daily weights and bring to cards and me this week to review.

## 2017-07-19 NOTE — Telephone Encounter (Signed)
Left message on vm for [facility], with [facility], relaying Dr. Timoteo ExposeG's message.

## 2017-07-20 ENCOUNTER — Telehealth: Payer: Self-pay | Admitting: *Deleted

## 2017-07-20 NOTE — Telephone Encounter (Signed)
Pt is scheduled on Fri, 07/22/17 at 3:00 PM.

## 2017-07-20 NOTE — Telephone Encounter (Signed)
[name] and gave an order to check his INR on Friday June 14th when Home Health nurse comes to the home Instructed to do INR early Friday Morning and call results to office as our office closes on Friday at 11am and she states she will instruct the nurse regarding this Also spoke with [name] and informed nurse will check his INR on Friday and called and had to place on Carla's phone (niece)  regarding above so she will be aware

## 2017-07-21 ENCOUNTER — Telehealth: Payer: Self-pay

## 2017-07-21 NOTE — Telephone Encounter (Signed)
Copied from CRM [id number]. Topic: Referral - Status >> Jul 21, 2017  9:40 AM Yvonna Alanisobinson, Andra M wrote: Reason for CRM: Trey PaulaJeff with [facility] [phone]) called stating that he had one visit with the patient for evaluation. Patient has declined any further services at this time.       Thank You!!!

## 2017-07-21 NOTE — Telephone Encounter (Signed)
Coumadin dose on his last coumadin encounter was  1 and 1/2 tablets (7.5mg )  daily except 2 tablets (10 mg)  on Wednesday This may be the dose he will need to go back on when INR checked

## 2017-07-22 ENCOUNTER — Ambulatory Visit (INDEPENDENT_AMBULATORY_CARE_PROVIDER_SITE_OTHER): Payer: Medicare Other | Admitting: Interventional Cardiology

## 2017-07-22 ENCOUNTER — Inpatient Hospital Stay: Payer: Medicare Other | Admitting: Family Medicine

## 2017-07-22 DIAGNOSIS — I4821 Permanent atrial fibrillation: Secondary | ICD-10-CM

## 2017-07-22 DIAGNOSIS — I482 Chronic atrial fibrillation: Secondary | ICD-10-CM | POA: Diagnosis not present

## 2017-07-22 DIAGNOSIS — Z5181 Encounter for therapeutic drug level monitoring: Secondary | ICD-10-CM

## 2017-07-22 LAB — POCT INR: INR: 1.4 — AB (ref 2.0–3.0)

## 2017-07-22 NOTE — Patient Instructions (Addendum)
Description   Spoke with Judeth CornfieldStephanie RN while in the home and instructed pt to take 2 tablets today and 2 tablets tomorrow then continue  taking 1.5 tablets every day except 2 tablets only on Wednesdays. Recheck INR on Wednesday.

## 2017-07-24 DIAGNOSIS — R402441 Other coma, without documented Glasgow coma scale score, or with partial score reported, in the field [EMT or ambulance]: Secondary | ICD-10-CM | POA: Diagnosis not present

## 2017-07-24 DIAGNOSIS — R410 Disorientation, unspecified: Secondary | ICD-10-CM | POA: Diagnosis not present

## 2017-07-24 DIAGNOSIS — R55 Syncope and collapse: Secondary | ICD-10-CM | POA: Diagnosis not present

## 2017-07-24 DIAGNOSIS — R1084 Generalized abdominal pain: Secondary | ICD-10-CM | POA: Diagnosis not present

## 2017-07-25 ENCOUNTER — Emergency Department (HOSPITAL_COMMUNITY): Payer: Medicare Other

## 2017-07-25 ENCOUNTER — Encounter (HOSPITAL_COMMUNITY): Payer: Self-pay | Admitting: Emergency Medicine

## 2017-07-25 ENCOUNTER — Inpatient Hospital Stay (HOSPITAL_COMMUNITY): Payer: Medicare Other

## 2017-07-25 ENCOUNTER — Other Ambulatory Visit: Payer: Self-pay

## 2017-07-25 ENCOUNTER — Inpatient Hospital Stay (HOSPITAL_COMMUNITY)
Admission: EM | Admit: 2017-07-25 | Discharge: 2017-07-29 | DRG: 064 | Disposition: A | Discharge status: Skilled Nursing Facility | Payer: Medicare Other | Attending: Internal Medicine | Admitting: Internal Medicine

## 2017-07-25 DIAGNOSIS — Z978 Presence of other specified devices: Secondary | ICD-10-CM

## 2017-07-25 DIAGNOSIS — R29702 NIHSS score 2: Secondary | ICD-10-CM | POA: Diagnosis not present

## 2017-07-25 DIAGNOSIS — I252 Old myocardial infarction: Secondary | ICD-10-CM

## 2017-07-25 DIAGNOSIS — I5032 Chronic diastolic (congestive) heart failure: Secondary | ICD-10-CM | POA: Diagnosis present

## 2017-07-25 DIAGNOSIS — R7881 Bacteremia: Secondary | ICD-10-CM | POA: Diagnosis present

## 2017-07-25 DIAGNOSIS — I6522 Occlusion and stenosis of left carotid artery: Secondary | ICD-10-CM | POA: Diagnosis not present

## 2017-07-25 DIAGNOSIS — N401 Enlarged prostate with lower urinary tract symptoms: Secondary | ICD-10-CM | POA: Diagnosis not present

## 2017-07-25 DIAGNOSIS — I2699 Other pulmonary embolism without acute cor pulmonale: Secondary | ICD-10-CM | POA: Diagnosis not present

## 2017-07-25 DIAGNOSIS — Z8673 Personal history of transient ischemic attack (TIA), and cerebral infarction without residual deficits: Secondary | ICD-10-CM | POA: Diagnosis not present

## 2017-07-25 DIAGNOSIS — E785 Hyperlipidemia, unspecified: Secondary | ICD-10-CM | POA: Diagnosis present

## 2017-07-25 DIAGNOSIS — M6281 Muscle weakness (generalized): Secondary | ICD-10-CM | POA: Diagnosis not present

## 2017-07-25 DIAGNOSIS — Z7901 Long term (current) use of anticoagulants: Secondary | ICD-10-CM

## 2017-07-25 DIAGNOSIS — B9689 Other specified bacterial agents as the cause of diseases classified elsewhere: Secondary | ICD-10-CM | POA: Diagnosis not present

## 2017-07-25 DIAGNOSIS — R0602 Shortness of breath: Secondary | ICD-10-CM | POA: Diagnosis not present

## 2017-07-25 DIAGNOSIS — Z960 Presence of urogenital implants: Secondary | ICD-10-CM

## 2017-07-25 DIAGNOSIS — F0150 Vascular dementia without behavioral disturbance: Secondary | ICD-10-CM | POA: Diagnosis not present

## 2017-07-25 DIAGNOSIS — Z7401 Bed confinement status: Secondary | ICD-10-CM | POA: Diagnosis not present

## 2017-07-25 DIAGNOSIS — R1110 Vomiting, unspecified: Secondary | ICD-10-CM | POA: Diagnosis not present

## 2017-07-25 DIAGNOSIS — I2510 Atherosclerotic heart disease of native coronary artery without angina pectoris: Secondary | ICD-10-CM | POA: Diagnosis not present

## 2017-07-25 DIAGNOSIS — N183 Chronic kidney disease, stage 3 unspecified: Secondary | ICD-10-CM | POA: Diagnosis present

## 2017-07-25 DIAGNOSIS — R339 Retention of urine, unspecified: Secondary | ICD-10-CM | POA: Diagnosis not present

## 2017-07-25 DIAGNOSIS — M2550 Pain in unspecified joint: Secondary | ICD-10-CM | POA: Diagnosis not present

## 2017-07-25 DIAGNOSIS — I639 Cerebral infarction, unspecified: Secondary | ICD-10-CM | POA: Diagnosis not present

## 2017-07-25 DIAGNOSIS — M109 Gout, unspecified: Secondary | ICD-10-CM | POA: Diagnosis present

## 2017-07-25 DIAGNOSIS — R609 Edema, unspecified: Secondary | ICD-10-CM | POA: Diagnosis not present

## 2017-07-25 DIAGNOSIS — I82411 Acute embolism and thrombosis of right femoral vein: Secondary | ICD-10-CM | POA: Diagnosis present

## 2017-07-25 DIAGNOSIS — I6340 Cerebral infarction due to embolism of unspecified cerebral artery: Principal | ICD-10-CM | POA: Diagnosis present

## 2017-07-25 DIAGNOSIS — R55 Syncope and collapse: Secondary | ICD-10-CM | POA: Diagnosis not present

## 2017-07-25 DIAGNOSIS — I513 Intracardiac thrombosis, not elsewhere classified: Secondary | ICD-10-CM | POA: Diagnosis present

## 2017-07-25 DIAGNOSIS — Z7982 Long term (current) use of aspirin: Secondary | ICD-10-CM

## 2017-07-25 DIAGNOSIS — E119 Type 2 diabetes mellitus without complications: Secondary | ICD-10-CM | POA: Diagnosis present

## 2017-07-25 DIAGNOSIS — Z87438 Personal history of other diseases of male genital organs: Secondary | ICD-10-CM | POA: Diagnosis present

## 2017-07-25 DIAGNOSIS — R1030 Lower abdominal pain, unspecified: Secondary | ICD-10-CM | POA: Diagnosis not present

## 2017-07-25 DIAGNOSIS — I130 Hypertensive heart and chronic kidney disease with heart failure and stage 1 through stage 4 chronic kidney disease, or unspecified chronic kidney disease: Secondary | ICD-10-CM | POA: Diagnosis not present

## 2017-07-25 DIAGNOSIS — I340 Nonrheumatic mitral (valve) insufficiency: Secondary | ICD-10-CM | POA: Diagnosis not present

## 2017-07-25 DIAGNOSIS — Z79899 Other long term (current) drug therapy: Secondary | ICD-10-CM

## 2017-07-25 DIAGNOSIS — Z87891 Personal history of nicotine dependence: Secondary | ICD-10-CM | POA: Diagnosis not present

## 2017-07-25 DIAGNOSIS — R2689 Other abnormalities of gait and mobility: Secondary | ICD-10-CM | POA: Diagnosis not present

## 2017-07-25 DIAGNOSIS — S0990XA Unspecified injury of head, initial encounter: Secondary | ICD-10-CM | POA: Diagnosis not present

## 2017-07-25 DIAGNOSIS — I10 Essential (primary) hypertension: Secondary | ICD-10-CM | POA: Diagnosis present

## 2017-07-25 DIAGNOSIS — I482 Chronic atrial fibrillation: Secondary | ICD-10-CM | POA: Diagnosis not present

## 2017-07-25 DIAGNOSIS — I4891 Unspecified atrial fibrillation: Secondary | ICD-10-CM | POA: Diagnosis present

## 2017-07-25 DIAGNOSIS — I82401 Acute embolism and thrombosis of unspecified deep veins of right lower extremity: Secondary | ICD-10-CM | POA: Diagnosis not present

## 2017-07-25 HISTORY — DX: Chronic kidney disease, stage 3 unspecified: N18.30

## 2017-07-25 HISTORY — DX: Presence of other specified devices: Z97.8

## 2017-07-25 HISTORY — DX: Chronic kidney disease, stage 3 (moderate): N18.3

## 2017-07-25 HISTORY — DX: Presence of urogenital implants: Z96.0

## 2017-07-25 HISTORY — DX: Chronic diastolic (congestive) heart failure: I50.32

## 2017-07-25 HISTORY — DX: Benign prostatic hyperplasia without lower urinary tract symptoms: N40.0

## 2017-07-25 LAB — COMPREHENSIVE METABOLIC PANEL
ALT: 28 U/L (ref 17–63)
ANION GAP: 10 (ref 5–15)
AST: 27 U/L (ref 15–41)
Albumin: 3.3 g/dL — ABNORMAL LOW (ref 3.5–5.0)
Alkaline Phosphatase: 59 U/L (ref 38–126)
BUN: 28 mg/dL — ABNORMAL HIGH (ref 6–20)
CHLORIDE: 101 mmol/L (ref 101–111)
CO2: 26 mmol/L (ref 22–32)
CREATININE: 1.67 mg/dL — AB (ref 0.61–1.24)
Calcium: 10.3 mg/dL (ref 8.9–10.3)
GFR, EST AFRICAN AMERICAN: 40 mL/min — AB (ref >60)
GFR, EST NON AFRICAN AMERICAN: 35 mL/min — AB (ref >60)
Glucose, Bld: 132 mg/dL — ABNORMAL HIGH (ref 65–99)
Potassium: 4 mmol/L (ref 3.5–5.1)
SODIUM: 137 mmol/L (ref 135–145)
Total Bilirubin: 0.6 mg/dL (ref 0.3–1.2)
Total Protein: 6.9 g/dL (ref 6.5–8.1)

## 2017-07-25 LAB — CBC WITH DIFFERENTIAL/PLATELET
ABS IMMATURE GRANULOCYTES: 0.1 10*3/uL (ref 0.0–0.1)
BASOS ABS: 0.1 10*3/uL (ref 0.0–0.1)
BASOS PCT: 1 %
EOS ABS: 0.2 10*3/uL (ref 0.0–0.7)
EOS PCT: 2 %
HCT: 41.1 % (ref 39.0–52.0)
Hemoglobin: 13.1 g/dL (ref 13.0–17.0)
Immature Granulocytes: 1 %
Lymphocytes Relative: 10 %
Lymphs Abs: 1.2 10*3/uL (ref 0.7–4.0)
MCH: 32 pg (ref 26.0–34.0)
MCHC: 31.9 g/dL (ref 30.0–36.0)
MCV: 100.5 fL — AB (ref 78.0–100.0)
MONO ABS: 0.9 10*3/uL (ref 0.1–1.0)
MONOS PCT: 7 %
Neutro Abs: 9.3 10*3/uL — ABNORMAL HIGH (ref 1.7–7.7)
Neutrophils Relative %: 79 %
PLATELETS: 246 10*3/uL (ref 150–400)
RBC: 4.09 MIL/uL — ABNORMAL LOW (ref 4.22–5.81)
RDW: 14.3 % (ref 11.5–15.5)
WBC: 11.7 10*3/uL — ABNORMAL HIGH (ref 4.0–10.5)

## 2017-07-25 LAB — URINALYSIS, ROUTINE W REFLEX MICROSCOPIC
Bilirubin Urine: NEGATIVE
Glucose, UA: NEGATIVE mg/dL
KETONES UR: NEGATIVE mg/dL
Nitrite: NEGATIVE
PROTEIN: NEGATIVE mg/dL
Specific Gravity, Urine: 1.009 (ref 1.005–1.030)
pH: 7 (ref 5.0–8.0)

## 2017-07-25 LAB — GLUCOSE, CAPILLARY: GLUCOSE-CAPILLARY: 104 mg/dL — AB (ref 65–99)

## 2017-07-25 LAB — HEPARIN LEVEL (UNFRACTIONATED): HEPARIN UNFRACTIONATED: 0.32 [IU]/mL (ref 0.30–0.70)

## 2017-07-25 LAB — I-STAT TROPONIN, ED: Troponin i, poc: 0.03 ng/mL (ref 0.00–0.08)

## 2017-07-25 LAB — PROTIME-INR
INR: 1.2
PROTHROMBIN TIME: 15.1 s (ref 11.4–15.2)

## 2017-07-25 LAB — ECHOCARDIOGRAM COMPLETE
HEIGHTINCHES: 72 in
Weight: 3350.99 oz

## 2017-07-25 LAB — TSH: TSH: 3.617 u[IU]/mL (ref 0.350–4.500)

## 2017-07-25 LAB — BRAIN NATRIURETIC PEPTIDE: B NATRIURETIC PEPTIDE 5: 165.4 pg/mL — AB (ref 0.0–100.0)

## 2017-07-25 LAB — I-STAT CG4 LACTIC ACID, ED: LACTIC ACID, VENOUS: 1.81 mmol/L (ref 0.5–1.9)

## 2017-07-25 MED ORDER — METOPROLOL SUCCINATE 12.5 MG HALF TABLET
12.5000 mg | ORAL_TABLET | Freq: Every day | ORAL | Status: DC
  Administered 2017-07-25: 12.5 mg via ORAL
  Filled 2017-07-25: qty 1

## 2017-07-25 MED ORDER — IOPAMIDOL (ISOVUE-370) INJECTION 76%
50.0000 mL | Freq: Once | INTRAVENOUS | Status: AC
  Administered 2017-07-25: 50 mL via INTRAVENOUS

## 2017-07-25 MED ORDER — FISH OIL 1000 MG PO CAPS: 1000.0000 mg | ORAL_CAPSULE | Freq: Every day | ORAL | Status: DC

## 2017-07-25 MED ORDER — WARFARIN SODIUM 7.5 MG PO TABS
7.5000 mg | ORAL_TABLET | Freq: Once | ORAL | Status: DC
  Filled 2017-07-25: qty 1

## 2017-07-25 MED ORDER — ACETAMINOPHEN 160 MG/5ML PO SOLN: 650.0000 mg | ORAL | Status: DC | PRN

## 2017-07-25 MED ORDER — ASPIRIN 300 MG RE SUPP: 300.0000 mg | Freq: Every day | RECTAL | Status: DC

## 2017-07-25 MED ORDER — ACETAMINOPHEN 650 MG RE SUPP: 650.0000 mg | RECTAL | Status: DC | PRN

## 2017-07-25 MED ORDER — ATORVASTATIN CALCIUM 80 MG PO TABS
80.0000 mg | ORAL_TABLET | Freq: Every day | ORAL | Status: DC
  Administered 2017-07-25 – 2017-07-29 (×5): 80 mg via ORAL
  Filled 2017-07-25 (×5): qty 1

## 2017-07-25 MED ORDER — HEPARIN (PORCINE) IN NACL 100-0.45 UNIT/ML-% IJ SOLN
1500.0000 [IU]/h | INTRAMUSCULAR | Status: DC
  Administered 2017-07-25 – 2017-07-27 (×3): 1400 [IU]/h via INTRAVENOUS
  Filled 2017-07-25 (×5): qty 250

## 2017-07-25 MED ORDER — HYDROCHLOROTHIAZIDE 12.5 MG PO CAPS
12.5000 mg | ORAL_CAPSULE | Freq: Every day | ORAL | Status: DC
  Administered 2017-07-25: 12.5 mg via ORAL
  Filled 2017-07-25: qty 1

## 2017-07-25 MED ORDER — LISINOPRIL-HYDROCHLOROTHIAZIDE 20-12.5 MG PO TABS: 1.0000 | ORAL_TABLET | Freq: Every day | ORAL | Status: DC

## 2017-07-25 MED ORDER — HEPARIN BOLUS VIA INFUSION
2500.0000 [IU] | Freq: Once | INTRAVENOUS | Status: AC
  Administered 2017-07-25: 2500 [IU] via INTRAVENOUS
  Filled 2017-07-25: qty 2500

## 2017-07-25 MED ORDER — STROKE: EARLY STAGES OF RECOVERY BOOK
Freq: Once | Status: AC
  Administered 2017-07-25: 21:00:00
  Filled 2017-07-25: qty 1

## 2017-07-25 MED ORDER — ASPIRIN 325 MG PO TABS
325.0000 mg | ORAL_TABLET | Freq: Every day | ORAL | Status: DC
  Administered 2017-07-25 – 2017-07-27 (×3): 325 mg via ORAL
  Filled 2017-07-25 (×3): qty 1

## 2017-07-25 MED ORDER — IOHEXOL 300 MG/ML  SOLN
100.0000 mL | Freq: Once | INTRAMUSCULAR | Status: AC | PRN
  Administered 2017-07-25: 100 mL via INTRAVENOUS

## 2017-07-25 MED ORDER — IOPAMIDOL (ISOVUE-370) INJECTION 76%
INTRAVENOUS | Status: AC
  Filled 2017-07-25: qty 100

## 2017-07-25 MED ORDER — PERFLUTREN LIPID MICROSPHERE
1.0000 mL | INTRAVENOUS | Status: AC | PRN
  Administered 2017-07-25: 2 mL via INTRAVENOUS
  Administered 2017-07-25: 1 mL via INTRAVENOUS
  Administered 2017-07-25: 2 mL via INTRAVENOUS
  Filled 2017-07-25: qty 10

## 2017-07-25 MED ORDER — SODIUM CHLORIDE 0.9 % IV SOLN
2.0000 g | Freq: Every day | INTRAVENOUS | Status: DC
  Administered 2017-07-25 – 2017-07-27 (×3): 2 g via INTRAVENOUS
  Filled 2017-07-25 (×4): qty 2

## 2017-07-25 MED ORDER — ALLOPURINOL 100 MG PO TABS
100.0000 mg | ORAL_TABLET | Freq: Every day | ORAL | Status: DC
  Administered 2017-07-25 – 2017-07-29 (×5): 100 mg via ORAL
  Filled 2017-07-25 (×5): qty 1

## 2017-07-25 MED ORDER — LEVOFLOXACIN 750 MG PO TABS: 750.0000 mg | ORAL_TABLET | ORAL | Status: DC

## 2017-07-25 MED ORDER — FUROSEMIDE 40 MG PO TABS
40.0000 mg | ORAL_TABLET | Freq: Every day | ORAL | Status: DC
  Administered 2017-07-25 – 2017-07-26 (×2): 40 mg via ORAL
  Filled 2017-07-25: qty 1
  Filled 2017-07-25: qty 2

## 2017-07-25 MED ORDER — FINASTERIDE 5 MG PO TABS
5.0000 mg | ORAL_TABLET | Freq: Every day | ORAL | Status: DC
  Administered 2017-07-25 – 2017-07-29 (×5): 5 mg via ORAL
  Filled 2017-07-25 (×5): qty 1

## 2017-07-25 MED ORDER — LISINOPRIL 20 MG PO TABS
20.0000 mg | ORAL_TABLET | Freq: Every day | ORAL | Status: DC
  Administered 2017-07-25: 20 mg via ORAL
  Filled 2017-07-25: qty 1

## 2017-07-25 MED ORDER — DIGOXIN 125 MCG PO TABS
125.0000 ug | ORAL_TABLET | Freq: Every day | ORAL | Status: DC
  Administered 2017-07-25 – 2017-07-28 (×4): 125 ug via ORAL
  Filled 2017-07-25 (×4): qty 1

## 2017-07-25 MED ORDER — OMEGA-3-ACID ETHYL ESTERS 1 G PO CAPS
1.0000 g | ORAL_CAPSULE | Freq: Every day | ORAL | Status: DC
  Administered 2017-07-25 – 2017-07-29 (×5): 1 g via ORAL
  Filled 2017-07-25 (×6): qty 1

## 2017-07-25 MED ORDER — WARFARIN - PHARMACIST DOSING INPATIENT: Freq: Every day | Status: DC

## 2017-07-25 MED ORDER — ACETAMINOPHEN 325 MG PO TABS: 650.0000 mg | ORAL_TABLET | ORAL | Status: DC | PRN

## 2017-07-25 NOTE — ED Notes (Signed)
X-ray at bedside

## 2017-07-25 NOTE — ED Notes (Signed)
Pt has chronic foley leg bag. Pt's leg bag changed in order to collect clean sample.

## 2017-07-25 NOTE — ED Notes (Signed)
Patient transported to CT 

## 2017-07-25 NOTE — ED Notes (Addendum)
Pt stating he has intense head and leg pains in sudden bouts. Wife states "he hit his head on the table when he fell at home."

## 2017-07-25 NOTE — ED Notes (Signed)
Pt was not able to state the month, the year, age, or birth date. Pt was able to state name and where he was. He was also able to name the two visitors that were at the bedside.

## 2017-07-25 NOTE — ED Notes (Signed)
Patient transported to MRI 

## 2017-07-25 NOTE — Progress Notes (Signed)
*  PRELIMINARY RESULTS* Echocardiogram 2D Echocardiogram has been performed with Definity.  Stacey DrainWhite, Emmarie Sannes J 07/25/2017, 5:30 PM

## 2017-07-25 NOTE — Progress Notes (Addendum)
ANTICOAGULATION CONSULT NOTE - Initial Consult  Pharmacy Consult for warfarin/heparin Indication: suspected LA thrombus on CT-presents w/ tiny CVA/likely embolic  Allergies  Allergen Reactions  . Benazepril Hcl Other (See Comments)    Lethargic, nervous, nauseated   Patient Measurements:   Heparin Dosing Weight: 95 kg  Vital Signs: BP: 141/70 (06/17 0915) Pulse Rate: 69 (06/17 0915)  Labs: Recent Labs    07/25/17 0051  HGB 13.1  HCT 41.1  PLT 246  LABPROT 15.1  INR 1.20  CREATININE 1.67*    CrCl cannot be calculated (Unknown ideal weight.).   Medical History: Past Medical History:  Diagnosis Date  . Atrial fibrillation (HCC)    chronic  . BPH (benign prostatic hyperplasia)   . CAD (coronary artery disease)    s/p MI, unsure when  . CHF (congestive heart failure) (HCC)   . Chronic diastolic heart failure (HCC)   . Chronic indwelling Foley catheter   . CKD (chronic kidney disease), stage III (HCC)   . Cognitive impairment    from stroke  . 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 DOE (dyspnea on exertion)   . Gout   . HTN (hypertension)   . Hyperlipidemia     Assessment: 89yoM on chronic warfarin for afib presents with syncope. Admit INR 1.2 and has been on hold for recent hematuria. Patient with suspected LA thrombus and CT reveals tiny CVA. CBC WNL. Pharmacy consulted to dose warfarin and bridge with heparin now for suspected thrombus.  Warfarin PTA: 10mg  on wednesdays and 7.5mg  all other days (55mg  per week) according to 05/2017 clinic note   Goal of Therapy:  INR 2-3 Heparin level 0.3-0.7 units/ml Monitor platelets by anticoagulation protocol: Yes   Plan:  Warfarin 7.5 mg x1 tonight Give 2500 units bolus x 1 (conservative for recent hematuria) Start heparin infusion at 1400 units/hr Check anti-Xa level in 8 hours and daily while on heparin Continue to monitor H&H and platelets  Ozan Maclay L  Omer Puccinelli 07/25/2017,11:05 AM

## 2017-07-25 NOTE — Progress Notes (Signed)
ANTICOAGULATION CONSULT NOTE - Follow Up Consult  Pharmacy Consult for Heparin Indication: atrial fibrillation  Allergies  Allergen Reactions  . Benazepril Hcl Other (See Comments)    Lethargic, nervous, nauseated    Patient Measurements: Height: 6' (182.9 cm) Weight: 209 lb 7 oz (95 kg) IBW/kg (Calculated) : 77.6 Heparin Dosing Weight:    Vital Signs: Temp: 98.4 F (36.9 C) (06/17 2023) Temp Source: Oral (06/17 2023) BP: 124/109 (06/17 2023) Pulse Rate: 57 (06/17 2023)  Labs: Recent Labs    07/25/17 0051 07/25/17 1912  HGB 13.1  --   HCT 41.1  --   PLT 246  --   LABPROT 15.1  --   INR 1.20  --   HEPARINUNFRC  --  0.32  CREATININE 1.67*  --     Estimated Creatinine Clearance: 35.9 mL/min (A) (by C-G formula based on SCr of 1.67 mg/dL (H)).  Assessment:  Anticoag: warf pta for afib (on hold PTA for hematuria) >> heparin. HL 0.32  Goal of Therapy:  Heparin level 0.3-0.7 units/ml Monitor platelets by anticoagulation protocol: Yes   Plan:  Continue IV heparin at 1400 units/hr Daily HL and CBC  Anelis Hrivnak S. Merilynn Finlandobertson, PharmD, BCPS Clinical Staff Pharmacist Pager [phone]  Misty Stanleyobertson, Cheral Cappucci Stillinger 07/25/2017,8:49 PM

## 2017-07-25 NOTE — ED Notes (Signed)
Returned from MRI 

## 2017-07-25 NOTE — Consult Note (Addendum)
Requesting Physician: Dr. Ophelia Charter    Chief Complaint: Stroke on MRI  History obtained from:  Patient  And  wife  HPI:                                                                                                                                         [name] is an 84 y.o. male with afib --Per chart "patient was told to temporarily stop warfarin, he he was restarted on 6 June however due to complications and continued urinary bleeding this was held.  He was discharged on this last Saturday and was to be followed up with cardiology this coming up Wednesday which would have been 4 days off the Coumadin.  Patient presents to the hospital today with an INR of 1.2.  Patient was brought to the hospital secondary to a syncopal episode.  Patient apparently was at the sink when he went to walk to the table he suddenly fell and hit his head on the table.  The loss of consciousness was only for short period of time and he quickly awoke.  There was no shaking, jerking, urinary incontinence.  However he does have a Foley so the incontinence would be hard to detect.  Currently he is back to his baseline.  Neurology was consulted secondary to MRI findings.  MRI showed a small  acute nonhemorrhagic posterior right frontal lobe infarct.    Date last known well: Date: 07/25/2017 Time last known well: Time: 07:00 tPA Given: No: Out of window and symptoms resolved NIH stroke scale of 2  Modified Rankin: Rankin Score=3    Past Medical History:  Diagnosis Date  . Atrial fibrillation (HCC)    chronic  . BPH (benign prostatic hyperplasia)   . CAD (coronary artery disease)    s/p MI, unsure when  . CHF (congestive heart failure) (HCC)   . Chronic diastolic heart failure (HCC)   . Chronic indwelling Foley catheter   . CKD (chronic kidney disease), stage III (HCC)   . Cognitive impairment    from stroke  . CVA (cerebrovascular accident) (HCC) x4 (latest 2007)   embolic, residual cognitive  impairment  . DOE (dyspnea on exertion)   . Gout   . HTN (hypertension)   . Hyperlipidemia     Past Surgical History:  Procedure Laterality Date  . APPENDECTOMY  1994  . CARDIOVASCULAR STRESS TEST  01/11/2008   evidence of an old scar of the septum and a larger apical scar but no reversible ischemia, EF 48%  . CATARACT EXTRACTION Left 2008   [facility]  . EXCISION MORTON'S NEUROMA  1995   x 2  . HEMORRHOID SURGERY    . TONSILLECTOMY  1964  . US ECHOCARDIOGRAPHY  12/19/2007   EF 55-60%, mild aortic stenosis and mild pulmonary hypertension and mild mitral regurgitation    Family History  Problem Relation Age of Onset  .  Cancer Brother        brain  . Cancer Sister        colon  . CAD Sister   . Alcohol abuse Brother          Social History:  reports that he quit smoking about 67 years ago. He has never used smokeless tobacco. He reports that he does not drink alcohol or use drugs.  Allergies:  Allergies  Allergen Reactions  . Benazepril Hcl Other (See Comments)    Lethargic, nervous, nauseated    Medications:                                                                                                                           Current Facility-Administered Medications  Medication Dose Route Frequency Provider Last Rate Last Dose  .  stroke: mapping our early stages of recovery book   Does not apply Once Russella Dar, NP      . acetaminophen (TYLENOL) tablet 650 mg  650 mg Oral Q4H PRN Russella Dar, NP       Or  . acetaminophen (TYLENOL) solution 650 mg  650 mg Per Tube Q4H PRN Russella Dar, NP       Or  . acetaminophen (TYLENOL) suppository 650 mg  650 mg Rectal Q4H PRN Russella Dar, NP      . aspirin suppository 300 mg  300 mg Rectal Daily Russella Dar, NP       Or  . aspirin tablet 325 mg  325 mg Oral Daily Russella Dar, NP      . iopamidol (ISOVUE-370) 76 % injection 50 mL  50 mL Intravenous Once Jonah Blue, MD       Current Outpatient  Medications  Medication Sig Dispense Refill  . allopurinol (ZYLOPRIM) 100 MG tablet Take 1 tablet (100 mg total) by mouth daily. 90 tablet 3  . aspirin EC 81 MG tablet Take 81 mg by mouth daily.    . digoxin (LANOXIN) 0.125 MG tablet Take 1 tablet (125 mcg total) by mouth every other day. (Patient taking differently: Take 125 mcg by mouth at bedtime. ) 45 tablet 3  . finasteride (PROSCAR) 5 MG tablet Take 5 mg by mouth daily.    . furosemide (LASIX) 40 MG tablet TAKE TWO TABLETS BY MOUTH ONCE DAILY IN THE MORNING 180 tablet 3  . levofloxacin (LEVAQUIN) 750 MG tablet Take 1 tablet (750 mg total) by mouth every other day. 7 tablet 0  . lisinopril-hydrochlorothiazide (PRINZIDE,ZESTORETIC) 20-12.5 MG tablet TAKE ONE TABLET BY MOUTH IN THE MORNING 90 tablet 3  . metoprolol succinate (TOPROL-XL) 25 MG 24 hr tablet TAKE ONE-HALF TABLET BY MOUTH ONCE DAILY (Patient taking differently: Take 12.5 mg by mouth daily. TAKE ONE-HALF TABLET BY MOUTH ONCE DAILY) 45 tablet 3  . nitroGLYCERIN (NITROSTAT) 0.4 MG SL tablet Place 0.4 mg under the tongue every 5 (five) minutes as needed (for chest pain).     Marland Kitchen  Omega-3 Fatty Acids (FISH OIL) 1000 MG CAPS Take 1,000 mg by mouth daily.     Marland Kitchen. warfarin (COUMADIN) 5 MG tablet TAKE 1 TABLET BY MOUTH ONCE DAILY AS DIRECTED BY  COUMADIN  CLINIC 140 tablet 0     ROS:                                                                                                                                       History obtained from spouse  General ROS: negative for - chills, fatigue, fever, night sweats, weight gain or weight loss Psychological ROS: negative for - , hallucinations, memory difficulties, mood swings or  Ophthalmic ROS: negative for - blurry vision, double vision, eye pain or loss of vision ENT ROS: negative for - epistaxis, nasal discharge, oral lesions, sore throat, tinnitus or vertigo Respiratory ROS: negative for - cough,  shortness of breath or  wheezing Cardiovascular ROS: negative for - chest pain, dyspnea on exertion,  Gastrointestinal ROS: negative for - abdominal pain, diarrhea,  nausea/vomiting or stool incontinence Genito-Urinary ROS: negative for - dysuria, hematuria, incontinence or urinary frequency/urgency Musculoskeletal ROS: negative for - joint swelling or muscular weakness Neurological ROS: as noted in HPI   General Examination:                                                                                                      Blood pressure (!) 141/70, pulse 69, resp. rate 15, SpO2 94 %.  HEENT-  Normocephalic, no lesions, without obvious abnormality.  Normal external eye and conjunctiva.   Cardiovascular- S1-S2 audible, pulses palpable throughout   Lungs-no rhonchi or wheezing noted, no excessive working breathing.  Saturations within normal limits Abdomen- All 4 quadrants palpated and nontender Extremities- Warm, dry and intact Musculoskeletal-no joint tenderness, deformity or swelling Skin-warm and dry, no hyperpigmentation, vitiligo, or suspicious lesions  Neurological Examination Mental Status: Alert, not oriented.  Speech fluent without evidence of aphasia.  Able to follow simple commands without difficulty. Cranial Nerves: II:  Visual fields grossly normal,  III,IV, VI: ptosis not present, extra-ocular motions intact bilaterally, pupils equal, round, reactive to light and accommodation V,VII: smile symmetric, facial light touch sensation normal bilaterally VIII: hearing normal bilaterally IX,X: uvula rises symmetrically XI: bilateral shoulder shrug XII: midline tongue extension Motor: Right : Upper extremity   5/5    Left:     Upper extremity   5/5  Lower extremity   5/5     Lower extremity   5/5  Tone and bulk:normal tone throughout; no atrophy noted Sensory: Pinprick and light touch intact throughout, bilaterally Deep Tendon Reflexes: 2+ and symmetric throughout without ankle  jerk Plantars: Right: downgoing   Left: downgoing Cerebellar: Difficult to obtain secondary to difficulty following instructions --normal finger-to-nose,  Gait: Not tested   Lab Results: Basic Metabolic Panel: Recent Labs  Lab 07/25/17 0051  NA 137  K 4.0  CL 101  CO2 26  GLUCOSE 132*  BUN 28*  CREATININE 1.67*  CALCIUM 10.3    CBC: Recent Labs  Lab 07/25/17 0051  WBC 11.7*  NEUTROABS 9.3*  HGB 13.1  HCT 41.1  MCV 100.5*  PLT 246    Lipid Panel: No results for input(s): CHOL, TRIG, HDL, CHOLHDL, VLDL, LDLCALC in the last 168 hours.  CBG: No results for input(s): GLUCAP in the last 168 hours.  Imaging: Ct Head Wo Contrast  Result Date: 07/25/2017 CLINICAL DATA:  Syncopal episode, loss of consciousness. Recently discharged from hospital for urinary tract infection. History of stroke, atrial fibrillation. EXAM: CT HEAD WITHOUT CONTRAST TECHNIQUE: Contiguous axial images were obtained from the base of the skull through the vertex without intravenous contrast. COMPARISON:  CT HEAD September 07, 2016 FINDINGS: BRAIN: No intraparenchymal hemorrhage, mass effect nor midline shift. Slight blurring of the RIGHT parietal gray-white matter junction. Smaller RIGHT frontal lobe encephalomalacia. Confluent LEFT temporoparietal encephalomalacia with ex vacuo dilatation subjacent ventricle. Moderate to severe parenchymal brain volume loss. Confluent supratentorial white matter hypodensities. Old RIGHT cerebellar infarct. Old LEFT basal ganglia infarct. Old small LEFT occipital lobe infarct. No abnormal extra-axial fluid collections. VASCULAR: Moderate calcific atherosclerosis of the carotid siphons. SKULL: No skull fracture. Osteopenia. No significant scalp soft tissue swelling. SINUSES/ORBITS: Atretic RIGHT maxillary sinus with bony remodeling consistent with chronic sinusitis. Mild maxillary sinus mucosal thickening. Mastoid air cells are well aerated. Status post bilateral ocular lens  implants. Enophthalmos. OTHER: None. IMPRESSION: 1. Acute small RIGHT parietal/MCA territory nonhemorrhagic infarct. Old RIGHT frontal/MCA territory infarct. 2. Old large LEFT MCA territory and small LEFT PCA territory infarct. Old RIGHT cerebellar infarcts. Old LEFT basal ganglia infarct. 3. Moderate to severe chronic small vessel ischemic changes. 4. Moderate to severe parenchymal brain volume loss. 5. Acute findings discussed with and reconfirmed by Dr.CHRISTOPHER POLLINA on 07/25/2017 at 2:24 am. Electronically Signed   By: Awilda Metro M.D.   On: 07/25/2017 02:26   Mr Brain Wo Contrast  Result Date: 07/25/2017 CLINICAL DATA:  84 year old male post fall. Possible syncope. Subsequent encounter. EXAM: MRI HEAD WITHOUT CONTRAST TECHNIQUE: Multiplanar, multiecho pulse sequences of the brain and surrounding structures were obtained without intravenous contrast. COMPARISON:  07/25/2017 head CT.  03/25/2010 brain MR. FINDINGS: Brain: Tiny acute nonhemorrhagic posterior right frontal lobe infarct. Moderately large remote left frontal-parietal-posterior temporal lobe infarct with blood-stained encephalomalacia. Subsequent mild dilation left lateral ventricle. Remote posterior right temporal-frontal lobe infarct. Remote left occipital lobe infarct with blood-stained encephalomalacia. Remote inferior right cerebellar infarct with blood-stained encephalomalacia. Remote partially hemorrhagic left cerebellar infarct. Scattered tiny blood breakdown products consistent with areas of prior hemorrhagic ischemia. Prominent chronic microvascular changes. Moderate global atrophy. No intracranial mass lesion noted on this unenhanced exam. Vascular: Major intracranial vascular structures are patent. Atherosclerotic changes suspected without large vessel occlusion. Skull and upper cervical spine: Motion degraded. Transverse ligament hypertrophy. Degenerative changes upper cervical spine. Sinuses/Orbits: Post lens replacement  without acute orbital abnormality. Mild mucosal thickening inferior maxillary sinuses. Minimal mucosal thickening ethmoid sinus air cells. Other: Negative. IMPRESSION: Tiny acute nonhemorrhagic posterior right frontal  lobe infarct. Multiple remote infarcts as detailed above. Chronic microvascular changes. Atrophy. Electronically Signed   By: Lacy Duverney M.D.   On: 07/25/2017 08:14   Ct Abdomen Pelvis W Contrast  Result Date: 07/25/2017 CLINICAL DATA:  Syncopal episode after standing up. Vomiting. On antibiotics for urinary tract infection. History of appendectomy, hemorrhoid surgery. EXAM: CT ABDOMEN AND PELVIS WITH CONTRAST TECHNIQUE: Multidetector CT imaging of the abdomen and pelvis was performed using the standard protocol following bolus administration of intravenous contrast. CONTRAST:  OMNIPAQUE IOHEXOL 300 MG/ML  SOLN COMPARISON:  Renal ultrasound July 13, 2017 FINDINGS: LOWER CHEST: Bilateral lower lobe atelectasis/scarring. 3 mm RIGHT lower lobe ground-glass pulmonary nodule, no routine indicated follow-up. The heart size is mildly enlarged. No pericardial effusion. Mild coronary artery calcification. Patchy hypodensities LEFT atrium. Enlarged heterogeneous RIGHT atrium. HEPATOBILIARY: Liver and gallbladder are normal. PANCREAS: Normal. SPLEEN: Splenosis. ADRENALS/URINARY TRACT: Kidneys are orthotopic, demonstrating symmetric enhancement. No nephrolithiasis, hydronephrosis or solid renal masses. Complex lobulated 5 cm RIGHT lower pole renal cyst with thin calcified septations. Bilateral simple renal cysts measuring to 2.6 cm. Exophytic dense 1 cm LEFT lower pole proteinaceous cysts. The unopacified ureters are normal in course and caliber. Delayed imaging through the kidneys demonstrates symmetric prompt contrast excretion within the proximal urinary collecting system. Urinary bladder is decompressed by Foley catheter. 19 mm LEFT adrenal nodule (20 Hounsfield on early phase, 15 Hounsfield units  on delayed phase). STOMACH/BOWEL: The stomach, small and large bowel are normal in course and caliber without inflammatory changes. Mild colonic diverticulosis. VASCULAR/LYMPHATIC: Mildly ectatic infrarenal aorta with moderate calcific atherosclerosis. No lymphadenopathy by CT size criteria. REPRODUCTIVE: In transaxial Prostatomegaly, prostate dimension is. 7.4 cm OTHER: Minimal free fluid in the pelvis may be physiologic. MUSCULOSKELETAL: Nonacute. Small fat containing inguinal hernias. RIGHT anterior abdominal wall scarring. Osteopenia. Ankylosis of the sacroiliac joints. Old RIGHT L3 transverse process fracture. Multiple Schmorl's nodes. Advanced degenerative changes spine. IMPRESSION: 1. Possible filling defects LEFT atrium. Given probable recent stroke, these are suspicious for clot. 2. Prostatomegaly. Decompressed urinary bladder with Foley catheter. No obstructive uropathy. 3. **An incidental finding of potential clinical significance has been found. 19 mm LEFT adrenal nodule, likely benign. Consider 12 month follow-up CT adrenal protocol. At the time recommend close attention to complex RIGHT renal cyst.** Aortic Atherosclerosis (ICD10-I70.0). Electronically Signed   By: Awilda Metro M.D.   On: 07/25/2017 05:11   Dg Chest Port 1 View  Result Date: 07/25/2017 CLINICAL DATA:  Larey Seat hit head, shortness of breath tonight. History of CHF. EXAM: PORTABLE CHEST 1 VIEW COMPARISON:  Chest radiograph July 14, 2017 FINDINGS: Cardiac silhouette is moderately enlarged. Calcified aortic arch. Chronic interstitial changes LEFT pleural effusion focal consolidation. No pneumothorax. Osteopenia. Soft tissue planes are non suspicious. IMPRESSION: Stable cardiomegaly and mild chronic interstitial changes. Aortic Atherosclerosis (ICD10-I70.0). Electronically Signed   By: Awilda Metro M.D.   On: 07/25/2017 00:53    Assessment and plan discussed with with attending physician and they are in agreement.    Felicie Morn PA-C [facility] [phone]  07/25/2017, 10:48 AM    NEUROHOSPITALIST ADDENDUM Seen and examined the patient today. I have reviewed the contents of history and physical exam as documented by PA/ARNP/Resident and agree with above documentation.  I have discussed and formulated the above plan as documented. Edits to the note have been made as needed.     Assessment: 84 y.o. male possible history of atrial fibrillation, kidney disease, CHF hypertension, hyperlipidemia presents to the hospital with syncope.  MRI brain shows multiple embolic infarcts likely from not be anticoagulated setting atrial fibrillation.  Coumadin was held due to GI bleed.  Stroke Risk Factors - atrial fibrillation, hyperlipidemia and hypertension   #Carotid Dopplers  #Transthoracic Echo  # Agree with heparin drip, may consider switching to direct oral anticoagulants in future  as long as there is no LV thrombus/CKD stable # Continue Atorvastatin 40 mg/other high intensity statin # BP goal: permissive HTN upto 185/110 mmHg # HBAIC and Lipid profile # Telemetry monitoring # Frequent neuro checks #stroke swallow screen  Please page stroke NP  Or  PA  Or MD from 8am -4 pm  as this patient from this time will be  followed by the stroke.   You can look them up on [email or web address]  Password [facility]    Georgiana Spinner Aroor MD [facility] [phone]   If 7pm to 7am, please call on call as listed on AMION.

## 2017-07-25 NOTE — Progress Notes (Signed)
Pharmacy Antibiotic Note  Ivan RayaCharles Lloyd Ramsey is a 84 y.o. male admitted on 07/25/2017 with bacteremia. Patient recently discharged with positive blood cultures and prescribed Levaquin. Decision made to give cefepime while admitted 2/2 to drug-drug interaction with warfarin. Pharmacy has been consulted for cefepime dosing.  Plan: cefepime 2G IV QD  Monitor clinical progression and LOT  Height: 6' (182.9 cm) Weight: 209 lb 7 oz (95 kg) IBW/kg (Calculated) : 77.6  No data recorded.  Recent Labs  Lab 07/25/17 0051 07/25/17 0100  WBC 11.7*  --   CREATININE 1.67*  --   LATICACIDVEN  --  1.81    Estimated Creatinine Clearance: 35.9 mL/min (A) (by C-G formula based on SCr of 1.67 mg/dL (H)).    Allergies  Allergen Reactions  . Benazepril Hcl Other (See Comments)    Lethargic, nervous, nauseated    Thank you for allowing pharmacy to be a part of this patient's care.  [name] [name] 07/25/2017 12:48 PM

## 2017-07-25 NOTE — ED Notes (Signed)
Lunch tray ordered 

## 2017-07-25 NOTE — ED Notes (Signed)
Neuro PA at bedside.  

## 2017-07-25 NOTE — ED Notes (Signed)
Admitting at bedside 

## 2017-07-25 NOTE — Care Management Note (Signed)
Case Management Note  Patient Details  Name: [name] MRN: [id number] Date of Birth: 15-Jul-1925  Subjective/Objective:                  [age] yo male patient was recently discharged on 07/16/2017 after an admission for sepsis secondary to E. coli UTI.  Action/Plan: Admit status INPATIENT (SEPSIS); anticipate discharge HOME WITH HOME HEALTH VS SNF.   Expected Discharge Date:  (unknown)               Expected Discharge Plan:  Home w Home Health Services  In-House Referral:  Clinical Social Work  Discharge planning Services  CM Consult  Post Acute Care Choice:    Choice offered to:     DME Arranged:    DME Agency:     HH Arranged:  RN, PT HH Agency:  [facility]  Status of Service:  In process, will continue to follow  If discussed at Long Length of Stay Meetings, dates discussed:    Additional Comments:  [name], Ivan Owczarzak, RN 07/25/2017, 1:49 PM

## 2017-07-25 NOTE — CV Procedure (Signed)
Echocardiogram wasn't completed at this time due to other procedures occurring. First attempt at echocardiogram a medical professional was speaking with the patient and asked for me to wait. I asked if the echo was really a true STAT and was told no. Transport arrived ten min later to take the patient for a CT. Echo will be re attempted at a later time.   Ivan Junglingiffany Angele Wiemann RDCS

## 2017-07-25 NOTE — Progress Notes (Signed)
Received patient from ED via stretcher; 2 person assist to the bed; bed low and locked with bed alarm set; wife and son present at bedside; patient has foley cath. To a leg bag presently; history obtained; will report admission to room to night RN.

## 2017-07-25 NOTE — ED Notes (Signed)
Call to 3W to give report 

## 2017-07-25 NOTE — ED Triage Notes (Signed)
Per EMS, pt from home. Pt recently discharged from hospital for UTI. Pt had a syncopal episode after standing up from the couch with positive LOC. Pt had 1 episode of vomiting. 4mg  zofran given by ems. Pt did not hit his head. Pt takes coumadin. Pt c/o lower abd pain, last BM today. Pt still taking Levaquin for UTI.   EMS VSS BP 139/83, no orthostatic changes.

## 2017-07-25 NOTE — H&P (Addendum)
History and Physical    [name] UJW:[id number] DOB: 1925-05-13 DOA: 07/25/2017  **Will admit patient based on the expectation that the patient will need hospitalization/ hospital care that crosses at least 2 midnights  PCP: Eustaquio Boyden, MD   Attending physician: Ophelia Charter  Patient coming from/Resides with: Private residence/wife  Chief Complaint: Syncope  HPI: [name] is a 84 y.o. male with medical history significant for history of recurrent [place]ick CVAs w/ initial episode 1999 with most recent 2007.  Patient also has history of BPH with chronic urinary retention requiring chronic Foley catheter, he has a history of chronic diastolic heart failure, chronic A. fib on warfarin, dyslipidemia, hypertension and stage III chronic kidney disease.  Patient was recently discharged on 07/16/2017 after an admission for sepsis secondary to E. coli UTI.  She will be treated with Rocephin and discharged on Omnicef for 2 weeks.  Since discharge patient's blood cultures returned positive for Pseudomonas, Klebsiella as well as E. coli therefore his PCP switched him to Levaquin 750 mg every other day (dose adjusted) secondary to low GFR on 6/10.  Patient is still taking this medication.  Was brought to the ER just around midnight on 6/17 after reported syncope event at home around 11:30 PM.  Wife reports patient was returning from kitchen after drinking water when he rotated to the right and fell and landed on his back.  She reports a few minutes of apparent loss of consciousness.  No bowel or bladder incontinence.  No apparent focal neurological deficits appreciated by wife.  Patient was brought to the ER where initial CT of the head revealed acute small right parietal/MCA territory nonhemorrhagic infarct.  This scan was reviewed by neuro hospitalist who recommended obtaining MRI brain to better clarify.  Apparently was also not orthostatic when tested according to triage note.  Patient has  subsequently undergone MRI of brain which does reveal a very tiny posterior frontal lobe infarct that is nonhemorrhagic in nature.  He was also complaining of abdominal pain noting he has chronic "mushy stools".  CT the abdomen performed in ER did not reveal any etiology to patient's abdominal pain but did raise suspicion of possible left atrial thrombus.  Patient does remain in atrial fibrillation with controlled ventricular rate.  There was subtherapeutic at 1.20 noting warfarin had been held in the previous admission in context of frank hematuria during previous hospitalization.  According to discharge summary warfarin was to resume after discharge but patient family called PCP office stating they were unclear as to when to resume the warfarin and instructions were given and warfarin was resumed on 6/11.  ED Course:  Vital Signs: BP (!) 141/70   Pulse 69   Resp 15   SpO2 94%  CXR: Neg CT head/MR brain: As above; old large left MCA and small left PCA territory as well as old right cerebellar and old left basal ganglia infarcts present CT abdomen/ pelvis: Possible filling defects left atrium suspicious for clot; incidental finding 1.9 mm left adrenal nodule likely benign  Medications and treatments: None  Review of Systems:  **Patient's history of what appears to be multi-infarct dementia and chronic issues with expressive aphasia unable to obtain adequate history from patient.  Wife at bedside and also has some memory and understanding issues.  Son at bedside and helping clarify history although is limited since he does not live with patient   Past Medical History:  Diagnosis Date  . Atrial fibrillation (HCC)  chronic  . BPH (benign prostatic hyperplasia)   . CAD (coronary artery disease)    s/p MI, unsure when  . CHF (congestive heart failure) (HCC)   . Chronic diastolic heart failure (HCC)   . Chronic indwelling Foley catheter   . CKD (chronic kidney disease), stage III (HCC)   .  Cognitive impairment    from stroke  . 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 DOE (dyspnea on exertion)   . Gout   . HTN (hypertension)   . Hyperlipidemia     Past Surgical History:  Procedure Laterality Date  . APPENDECTOMY  1994  . CARDIOVASCULAR STRESS TEST  01/11/2008   evidence of an old scar of the septum and a larger apical scar but no reversible ischemia, EF 48%  . CATARACT EXTRACTION Left 2008   [facility]  . EXCISION MORTON'S NEUROMA  1995   x 2  . HEMORRHOID SURGERY    . TONSILLECTOMY  1964  . US ECHOCARDIOGRAPHY  12/19/2007   EF 55-60%, mild aortic stenosis and mild pulmonary hypertension and mild mitral regurgitation    Social History   Socioeconomic History  . Marital status: Single    Spouse name: Not on file  . Number of children: Not on file  . Years of education: Not on file  . Highest education level: Not on file  Occupational History  . Not on file  Social Needs  . Financial resource strain: Not on file  . Food insecurity:    Worry: Not on file    Inability: Not on file  . Transportation needs:    Medical: Not on file    Non-medical: Not on file  Tobacco Use  . Smoking status: Former Smoker    Last attempt to quit: 02/08/1950    Years since quitting: 67.5  . Smokeless tobacco: Never Used  Substance and Sexual Activity  . Alcohol use: No  . Drug use: No  . Sexual activity: Not on file  Lifestyle  . Physical activity:    Days per week: Not on file    Minutes per session: Not on file  . Stress: Not on file  Relationships  . Social connections:    Talks on phone: Not on file    Gets together: Not on file    Attends religious service: Not on file    Active member of club or organization: Not on file    Attends meetings of clubs or organizations: Not on file    Relationship status: Not on file  . Intimate partner violence:    Fear of current or ex partner: Not on file    Emotionally  abused: Not on file    Physically abused: Not on file    Forced sexual activity: Not on file  Other Topics Concern  . Not on file  Social History Narrative   Lives with wife   2 grown children live ([place] and [place]). Estranged from local son. Not close to sons. Sees grandchildren regularly.   Occupation: retired, prior Personnel officer   Activity:    Diet:     Mobility: Cannot utilize rolling walker for short distances but essentially mobilizes via wheelchair Work history: Not obtained   Allergies  Allergen Reactions  . Benazepril Hcl Other (See Comments)    Lethargic, nervous, nauseated    Family History  Problem Relation Age of Onset  . Cancer Brother  brain  . Cancer Sister        colon  . CAD Sister   . Alcohol abuse Brother      Prior to Admission medications   Medication Sig Start Date End Date Taking? Authorizing Provider  allopurinol (ZYLOPRIM) 100 MG tablet Take 1 tablet (100 mg total) by mouth daily. 02/21/17  Yes Eustaquio Boyden, MD  aspirin EC 81 MG tablet Take 81 mg by mouth daily.   Yes [provider]  digoxin (LANOXIN) 0.125 MG tablet Take 1 tablet (125 mcg total) by mouth every other day. Patient taking differently: Take 125 mcg by mouth at bedtime.  01/31/17  Yes Nahser, Deloris Ping, MD  finasteride (PROSCAR) 5 MG tablet Take 5 mg by mouth daily.   Yes [provider]  furosemide (LASIX) 40 MG tablet TAKE TWO TABLETS BY MOUTH ONCE DAILY IN THE MORNING 01/25/17  Yes Nahser, Deloris Ping, MD  levofloxacin (LEVAQUIN) 750 MG tablet Take 1 tablet (750 mg total) by mouth every other day. 07/18/17  Yes Eustaquio Boyden, MD  lisinopril-hydrochlorothiazide (PRINZIDE,ZESTORETIC) 20-12.5 MG tablet TAKE ONE TABLET BY MOUTH IN THE MORNING 01/03/17  Yes Nahser, Deloris Ping, MD  metoprolol succinate (TOPROL-XL) 25 MG 24 hr tablet TAKE ONE-HALF TABLET BY MOUTH ONCE DAILY Patient taking differently: Take 12.5 mg by mouth daily. TAKE ONE-HALF  TABLET BY MOUTH ONCE DAILY 01/06/17  Yes Nahser, Deloris Ping, MD  nitroGLYCERIN (NITROSTAT) 0.4 MG SL tablet Place 0.4 mg under the tongue every 5 (five) minutes as needed (for chest pain).    Yes [provider]  Omega-3 Fatty Acids (FISH OIL) 1000 MG CAPS Take 1,000 mg by mouth daily.    Yes [provider]  warfarin (COUMADIN) 5 MG tablet TAKE 1 TABLET BY MOUTH ONCE DAILY AS DIRECTED BY  COUMADIN  CLINIC 06/02/17   Nahser, Deloris Ping, MD    Physical Exam: Vitals:   07/25/17 0800 07/25/17 0815 07/25/17 0830 07/25/17 0915  BP: 139/64 135/70 113/63 (!) 141/70  Pulse: 80 66 66 69  Resp: (!) 24 16 20 15   SpO2: 96% 95% 95% 94%      Constitutional: NAD, slightly restless but pleasant, comfortable Eyes: PERRL, lids and conjunctivae normal ENMT: Mucous membranes are moist. Posterior pharynx clear of any exudate or lesions  Neck: normal, supple, no masses, no thyromegaly Respiratory: clear to auscultation bilaterally, no wheezing, no crackles. Normal respiratory effort. No accessory muscle use.  Cardiovascular: Irregular rhythm c/w she with controlled ventricular response,  grade 3/6 systolic murmur LSB 2nd ICS, no rubs / gallops. No lower extremity edema. 2+ pedal pulses. No carotid bruits.  Abdomen: no tenderness, no masses palpated. No hepatosplenomegaly. Bowel sounds positive.  Musculoskeletal: no clubbing / cyanosis. No joint deformity upper and lower extremities. Good ROM, no contractures. Normal muscle tone.  Skin: no rashes, lesions, ulcers. No induration Neurologic: CN 2-12 grossly intact except for mild right eyelid and right mouth drooping. Sensation intact, DTR normal. Strength 4/5 x all 4 extremities.  Notable for expressive aphasia which is chronic secondary to prior strokes. Psychiatric: Alert and oriented x name only. Normal mood.    Labs on Admission: I have personally reviewed following labs and imaging studies  CBC: Recent Labs  Lab 07/25/17 0051  WBC 11.7*   NEUTROABS 9.3*  HGB 13.1  HCT 41.1  MCV 100.5*  PLT 246   Basic Metabolic Panel: Recent Labs  Lab 07/25/17 0051  NA 137  K 4.0  CL 101  CO2 26  GLUCOSE 132*  BUN 28*  CREATININE 1.67*  CALCIUM 10.3   GFR: CrCl cannot be calculated (Unknown ideal weight.). Liver Function Tests: Recent Labs  Lab 07/25/17 0051  AST 27  ALT 28  ALKPHOS 59  BILITOT 0.6  PROT 6.9  ALBUMIN 3.3*   No results for input(s): LIPASE, AMYLASE in the last 168 hours. No results for input(s): AMMONIA in the last 168 hours. Coagulation Profile: Recent Labs  Lab 07/22/17 07/25/17 0051  INR 1.4* 1.20   Cardiac Enzymes: No results for input(s): CKTOTAL, CKMB, CKMBINDEX, TROPONINI in the last 168 hours. BNP (last 3 results) No results for input(s): PROBNP in the last 8760 hours. HbA1C: No results for input(s): HGBA1C in the last 72 hours. CBG: No results for input(s): GLUCAP in the last 168 hours. Lipid Profile: No results for input(s): CHOL, HDL, LDLCALC, TRIG, CHOLHDL, LDLDIRECT in the last 72 hours. Thyroid Function Tests: No results for input(s): TSH, T4TOTAL, FREET4, T3FREE, THYROIDAB in the last 72 hours. Anemia Panel: No results for input(s): VITAMINB12, FOLATE, FERRITIN, TIBC, IRON, RETICCTPCT in the last 72 hours. Urine analysis:    Component Value Date/Time   COLORURINE YELLOW 07/25/2017 0307   APPEARANCEUR CLEAR 07/25/2017 0307   LABSPEC 1.009 07/25/2017 0307   PHURINE 7.0 07/25/2017 0307   GLUCOSEU NEGATIVE 07/25/2017 0307   HGBUR SMALL (A) 07/25/2017 0307   BILIRUBINUR NEGATIVE 07/25/2017 0307   KETONESUR NEGATIVE 07/25/2017 0307   PROTEINUR NEGATIVE 07/25/2017 0307   UROBILINOGEN 0.2 03/24/2010 1755   NITRITE NEGATIVE 07/25/2017 0307   LEUKOCYTESUR TRACE (A) 07/25/2017 0307   Sepsis Labs: @LABRCNTIP (procalcitonin:4,lacticidven:4) )No results found for this or any previous visit (from the past 240 hour(s)).   Radiological Exams on Admission: Ct Head Wo  Contrast  Result Date: 07/25/2017 CLINICAL DATA:  Syncopal episode, loss of consciousness. Recently discharged from hospital for urinary tract infection. History of stroke, atrial fibrillation. EXAM: CT HEAD WITHOUT CONTRAST TECHNIQUE: Contiguous axial images were obtained from the base of the skull through the vertex without intravenous contrast. COMPARISON:  CT HEAD September 07, 2016 FINDINGS: BRAIN: No intraparenchymal hemorrhage, mass effect nor midline shift. Slight blurring of the RIGHT parietal gray-white matter junction. Smaller RIGHT frontal lobe encephalomalacia. Confluent LEFT temporoparietal encephalomalacia with ex vacuo dilatation subjacent ventricle. Moderate to severe parenchymal brain volume loss. Confluent supratentorial white matter hypodensities. Old RIGHT cerebellar infarct. Old LEFT basal ganglia infarct. Old small LEFT occipital lobe infarct. No abnormal extra-axial fluid collections. VASCULAR: Moderate calcific atherosclerosis of the carotid siphons. SKULL: No skull fracture. Osteopenia. No significant scalp soft tissue swelling. SINUSES/ORBITS: Atretic RIGHT maxillary sinus with bony remodeling consistent with chronic sinusitis. Mild maxillary sinus mucosal thickening. Mastoid air cells are well aerated. Status post bilateral ocular lens implants. Enophthalmos. OTHER: None. IMPRESSION: 1. Acute small RIGHT parietal/MCA territory nonhemorrhagic infarct. Old RIGHT frontal/MCA territory infarct. 2. Old large LEFT MCA territory and small LEFT PCA territory infarct. Old RIGHT cerebellar infarcts. Old LEFT basal ganglia infarct. 3. Moderate to severe chronic small vessel ischemic changes. 4. Moderate to severe parenchymal brain volume loss. 5. Acute findings discussed with and reconfirmed by Dr.CHRISTOPHER POLLINA on 07/25/2017 at 2:24 am. Electronically Signed   By: Awilda Metro M.D.   On: 07/25/2017 02:26   Mr Brain Wo Contrast  Result Date: 07/25/2017 CLINICAL DATA:  84 year old male post  fall. Possible syncope. Subsequent encounter. EXAM: MRI HEAD WITHOUT CONTRAST TECHNIQUE: Multiplanar, multiecho pulse sequences of the brain and surrounding structures were obtained without intravenous contrast. COMPARISON:  07/25/2017 head  CT.  03/25/2010 brain MR. FINDINGS: Brain: Tiny acute nonhemorrhagic posterior right frontal lobe infarct. Moderately large remote left frontal-parietal-posterior temporal lobe infarct with blood-stained encephalomalacia. Subsequent mild dilation left lateral ventricle. Remote posterior right temporal-frontal lobe infarct. Remote left occipital lobe infarct with blood-stained encephalomalacia. Remote inferior right cerebellar infarct with blood-stained encephalomalacia. Remote partially hemorrhagic left cerebellar infarct. Scattered tiny blood breakdown products consistent with areas of prior hemorrhagic ischemia. Prominent chronic microvascular changes. Moderate global atrophy. No intracranial mass lesion noted on this unenhanced exam. Vascular: Major intracranial vascular structures are patent. Atherosclerotic changes suspected without large vessel occlusion. Skull and upper cervical spine: Motion degraded. Transverse ligament hypertrophy. Degenerative changes upper cervical spine. Sinuses/Orbits: Post lens replacement without acute orbital abnormality. Mild mucosal thickening inferior maxillary sinuses. Minimal mucosal thickening ethmoid sinus air cells. Other: Negative. IMPRESSION: Tiny acute nonhemorrhagic posterior right frontal lobe infarct. Multiple remote infarcts as detailed above. Chronic microvascular changes. Atrophy. Electronically Signed   By: Lacy DuverneySteven  Olson M.D.   On: 07/25/2017 08:14   Ct Abdomen Pelvis W Contrast  Result Date: 07/25/2017 CLINICAL DATA:  Syncopal episode after standing up. Vomiting. On antibiotics for urinary tract infection. History of appendectomy, hemorrhoid surgery. EXAM: CT ABDOMEN AND PELVIS WITH CONTRAST TECHNIQUE: Multidetector CT  imaging of the abdomen and pelvis was performed using the standard protocol following bolus administration of intravenous contrast. CONTRAST:  100mL OMNIPAQUE IOHEXOL 300 MG/ML  SOLN COMPARISON:  Renal ultrasound July 13, 2017 FINDINGS: LOWER CHEST: Bilateral lower lobe atelectasis/scarring. 3 mm RIGHT lower lobe ground-glass pulmonary nodule, no routine indicated follow-up. The heart size is mildly enlarged. No pericardial effusion. Mild coronary artery calcification. Patchy hypodensities LEFT atrium. Enlarged heterogeneous RIGHT atrium. HEPATOBILIARY: Liver and gallbladder are normal. PANCREAS: Normal. SPLEEN: Splenosis. ADRENALS/URINARY TRACT: Kidneys are orthotopic, demonstrating symmetric enhancement. No nephrolithiasis, hydronephrosis or solid renal masses. Complex lobulated 5 cm RIGHT lower pole renal cyst with thin calcified septations. Bilateral simple renal cysts measuring to 2.6 cm. Exophytic dense 1 cm LEFT lower pole proteinaceous cysts. The unopacified ureters are normal in course and caliber. Delayed imaging through the kidneys demonstrates symmetric prompt contrast excretion within the proximal urinary collecting system. Urinary bladder is decompressed by Foley catheter. 19 mm LEFT adrenal nodule (20 Hounsfield on early phase, 15 Hounsfield units on delayed phase). STOMACH/BOWEL: The stomach, small and large bowel are normal in course and caliber without inflammatory changes. Mild colonic diverticulosis. VASCULAR/LYMPHATIC: Mildly ectatic infrarenal aorta with moderate calcific atherosclerosis. No lymphadenopathy by CT size criteria. REPRODUCTIVE: In transaxial Prostatomegaly, prostate dimension is. 7.4 cm OTHER: Minimal free fluid in the pelvis may be physiologic. MUSCULOSKELETAL: Nonacute. Small fat containing inguinal hernias. RIGHT anterior abdominal wall scarring. Osteopenia. Ankylosis of the sacroiliac joints. Old RIGHT L3 transverse process fracture. Multiple Schmorl's nodes. Advanced  degenerative changes spine. IMPRESSION: 1. Possible filling defects LEFT atrium. Given probable recent stroke, these are suspicious for clot. 2. Prostatomegaly. Decompressed urinary bladder with Foley catheter. No obstructive uropathy. 3. **An incidental finding of potential clinical significance has been found. 19 mm LEFT adrenal nodule, likely benign. Consider 12 month follow-up CT adrenal protocol. At the time recommend close attention to complex RIGHT renal cyst.** Aortic Atherosclerosis (ICD10-I70.0). Electronically Signed   By: Awilda Metroourtnay  Bloomer M.D.   On: 07/25/2017 05:11   Dg Chest Port 1 View  Result Date: 07/25/2017 CLINICAL DATA:  Larey SeatFell hit head, shortness of breath tonight. History of CHF. EXAM: PORTABLE CHEST 1 VIEW COMPARISON:  Chest radiograph July 14, 2017 FINDINGS: Cardiac silhouette is moderately enlarged. Calcified  aortic arch. Chronic interstitial changes LEFT pleural effusion focal consolidation. No pneumothorax. Osteopenia. Soft tissue planes are non suspicious. IMPRESSION: Stable cardiomegaly and mild chronic interstitial changes. Aortic Atherosclerosis (ICD10-I70.0). Electronically Signed   By: Awilda Metro M.D.   On: 07/25/2017 00:53    EKG: (Independently reviewed) ordered but not yet completed  Assessment/Plan Principal Problem:   Acute embolic stroke/History of cardioembolic cerebrovascular accident /? Mural thrombus of left atrium -Patient presents after syncopal episode with CT and MRI revealing very tiny right frontal lobe infarct in the context of known prior embolic CVA, subtherapeutic INR and suspicion of left atrial as incidental finding on CT abdomen pelvis -Neuro hospitalist is following and okay with full dose anticoagulation with heparin -STAT echocardiogram to clarify if truly has left atrial thrombus; if unclear may require TEE this admission -CTA head and neck -HgbA1c/FLP -Not on statin prior to admission, only on omega-3 fatty acids -PT/OT/SLP  evaluation -Frequent neurological checks -Full dose aspirin for now although given requirement for full dose anticoagulation and recent episodes of hematuria likely will need to revert back to low-dose aspirin prior to discharge  Active Problems:   Gram-negative bacteremia -Recent admission for sepsis secondary to E. coli UTI with subsequent blood cultures positive for Pseudomonas, Klebsiella and E. coli L sensitive to fluroquinolone -Started on Levaquin by PCP on 6/10-recommended 14 days of therapy with dosing schedule QOD in context of stage III chronic kidney disease -Given issues with interaction with warfarin and possible supratherapeutic INR have opted at least while hospitalized to utilize cefepime w/ pharmacy dosing -No fever or leukocytosis at this juncture    Acute bilateral pulmonary embolism -Incidental finding on CTA head and neck -In review of outpatient documentation patient had been having some issues with bilateral lower extremity edema after discharge -suspect patient may have been developing DVT at that time -Preadmission INR subtherapeutic-currently on warfarin with heparin IV bridge -Not hypoxemic or tachycardic -Legs are soft but will obtain bilateral lower extremity duplex to complete evaluation -? If has PFO could LA thrombus cause PE    Atrial fibrillation  -Currently rate controlled on combination digoxin and metoprolol -INR subtherapeutic as noted above -Pharmacy consulted to assist with warfarin dosing and to utilize IV heparin for bridging; utilizing IV heparin since uncertain at this juncture if may require invasive procedure such as TEE during this hospitalization -CHA2DS2-VASc=6     Abdominal pain -Exam unrevealing as was CT of the abdomen -Continue to follow    HTN (hypertension) -Modestly controlled -Continue preadmission ACE inhibitor, thiazide diuretic, loop diuretic and beta-blocker    Chronic diastolic heart failure  -Compensated without  evidence of acute heart failure on exam chest x-ray -Continue beta blocker and ACE inhibitor as above -Last echocardiogram in 2009 with mild LVH, moderate aortic stenosis, mild mitral regurgitation, mild tricuspid regurgitation with mild pulmonary hypertension -Follow-up on above echo    Hyperlipidemia -Continue omega-3 fatty acids -Was not on statin prior to admission    CKD (chronic kidney disease), stage III  -Renal function stable and at baseline    Chronic indwelling Foley catheter/chronic urinary tension due to History of BPH -During previous hospitalization had issues with gross hematuria therefore monitor for recurrence with initiation of heparin/warfarin -Per PCP records patient to have Foley changed monthly-uncertain when this was last accomplished    **Additional lab, imaging and/or diagnostic evaluation at discretion of supervising physician  DVT prophylaxis: Warfarin with IV heparin bridge Code Status: Full Family Communication:-Wife and son at bedside Disposition Plan: Home  Consults called: Neurology/Kirkpatrick    Nathyn Luiz L. ANP-BC [facility] Pager [phone]   If 7PM-7AM, please contact night-coverage [email or web address] Password [email or web address]  07/25/2017, 11:11 AM

## 2017-07-25 NOTE — ED Provider Notes (Signed)
[facility] EMERGENCY DEPARTMENT Provider Note   CSN: [id number] Arrival date & time: 07/25/17  0008     History   Chief Complaint Chief Complaint  Patient presents with  . Loss of Consciousness  . Nausea  . Emesis    HPI [name] is a 84 y.o. male.  Brought to the emergency department for evaluation after a fall and possible syncope.  Wife reports that he was trying to walk back to his chair and was unsteady on his feet.  He was using his walker.  He fell backwards and hit his head on a table and then landed on a carpeted floor.  She tells me that he did not lose consciousness.  At arrival to the ER patient is awake and alert.  He denies headache.  He does, however does state that he has been experiencing lower abdominal pain for several days.  Last bowel movement today.  He has not noticed urinary symptoms, however, was just hospitalized for UTI and is on Levaquin.  He reports that he has been passing his urine without difficulty.     Past Medical History:  Diagnosis Date  . Atrial fibrillation (HCC)    chronic  . CAD (coronary artery disease)    s/p MI, unsure when  . CHF (congestive heart failure) (HCC)   . Cognitive impairment    from stroke  . 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 DOE (dyspnea on exertion)   . Gout   . HTN (hypertension)   . Hyperlipidemia     Patient Active Problem List   Diagnosis Date Noted  . Urinary tract infection associated with indwelling urethral catheter (HCC)   . Sepsis (HCC) 07/13/2017  . Acute lower UTI 07/13/2017  . AKI (acute kidney injury) (HCC) 07/13/2017  . Renal insufficiency 02/27/2017  . Health maintenance examination 10/06/2016  . Primary hyperparathyroidism (HCC) 10/06/2016  . Sacral pressure sore 10/06/2016  . Urinary incontinence 10/06/2016  . Tinea pedis 03/26/2016  . Medicare annual wellness visit, subsequent 10/31/2014  .  Advanced care planning/counseling discussion 10/31/2014  . DNR (do not resuscitate) 10/31/2014  . Dyspnea on exertion 09/03/2013  . Encounter for therapeutic drug monitoring 05/04/2013  . Gout 09/01/2012  . Lesion of skin of cheek 06/23/2012  . Cognitive impairment   . Permanent atrial fibrillation (HCC) 06/08/2010  . Long term (current) use of anticoagulants 06/08/2010  . Right bundle branch block 06/08/2010  . Aphasia, post-stroke 06/08/2010  . Aortic stenosis, mild 06/08/2010  . Chronic diastolic congestive heart failure (HCC) 06/08/2010  . Hypercholesterolemia 06/08/2010  . BPH (benign prostatic hyperplasia) 06/08/2010  . Essential hypertension 06/08/2010    Past Surgical History:  Procedure Laterality Date  . APPENDECTOMY  1994  . CARDIOVASCULAR STRESS TEST  01/11/2008   evidence of an old scar of the septum and a larger apical scar but no reversible ischemia, EF 48%  . CATARACT EXTRACTION Left 2008   [place]  . EXCISION MORTON'S NEUROMA  1995   x 2  . HEMORRHOID SURGERY    . TONSILLECTOMY  1964  . US ECHOCARDIOGRAPHY  12/19/2007   EF 55-60%, mild aortic stenosis and mild pulmonary hypertension and mild mitral regurgitation        Home Medications    Prior to Admission medications   Medication Sig Start Date End Date Taking? Authorizing Provider  allopurinol (ZYLOPRIM) 100 MG tablet Take 1 tablet (100 mg total) by mouth daily.  02/21/17  Yes Eustaquio BoydenGutierrez, Javier, MD  aspirin EC 81 MG tablet Take 81 mg by mouth daily.   Yes [provider]  digoxin (LANOXIN) 0.125 MG tablet Take 1 tablet (125 mcg total) by mouth every other day. Patient taking differently: Take 125 mcg by mouth at bedtime.  01/31/17  Yes Nahser, Deloris PingPhilip J, MD  finasteride (PROSCAR) 5 MG tablet Take 5 mg by mouth daily.   Yes [provider]  furosemide (LASIX) 40 MG tablet TAKE TWO TABLETS BY MOUTH ONCE DAILY IN THE MORNING 01/25/17  Yes Nahser, Deloris PingPhilip J, MD  levofloxacin (LEVAQUIN) 750 MG  tablet Take 1 tablet (750 mg total) by mouth every other day. 07/18/17  Yes Eustaquio BoydenGutierrez, Javier, MD  lisinopril-hydrochlorothiazide (PRINZIDE,ZESTORETIC) 20-12.5 MG tablet TAKE ONE TABLET BY MOUTH IN THE MORNING 01/03/17  Yes Nahser, Deloris PingPhilip J, MD  metoprolol succinate (TOPROL-XL) 25 MG 24 hr tablet TAKE ONE-HALF TABLET BY MOUTH ONCE DAILY Patient taking differently: Take 12.5 mg by mouth daily. TAKE ONE-HALF TABLET BY MOUTH ONCE DAILY 01/06/17  Yes Nahser, Deloris PingPhilip J, MD  nitroGLYCERIN (NITROSTAT) 0.4 MG SL tablet Place 0.4 mg under the tongue every 5 (five) minutes as needed (for chest pain).    Yes [provider]  Omega-3 Fatty Acids (FISH OIL) 1000 MG CAPS Take 1,000 mg by mouth daily.    Yes [provider]  warfarin (COUMADIN) 5 MG tablet TAKE 1 TABLET BY MOUTH ONCE DAILY AS DIRECTED BY  COUMADIN  CLINIC 06/02/17   Nahser, Deloris PingPhilip J, MD    Family History Family History  Problem Relation Age of Onset  . Cancer Brother        brain  . Cancer Sister        colon  . CAD Sister   . Alcohol abuse Brother     Social History Social History   Tobacco Use  . Smoking status: Former Smoker    Last attempt to quit: 02/08/1950    Years since quitting: 67.5  . Smokeless tobacco: Never Used  Substance Use Topics  . Alcohol use: No  . Drug use: No     Allergies   Benazepril hcl   Review of Systems Review of Systems  Gastrointestinal: Positive for abdominal pain.  All other systems reviewed and are negative.    Physical Exam Updated Vital Signs BP 131/68   Pulse 72   Resp (!) 22   SpO2 95%   Physical Exam  Constitutional: He is oriented to person, place, and time. He appears well-developed and well-nourished. No distress.  HENT:  Head: Normocephalic and atraumatic.  Right Ear: Hearing normal.  Left Ear: Hearing normal.  Nose: Nose normal.  Mouth/Throat: Oropharynx is clear and moist and mucous membranes are normal.  Eyes: Pupils are equal, round, and reactive  to light. Conjunctivae and EOM are normal.  Neck: Normal range of motion. Neck supple.  Cardiovascular: Regular rhythm, S1 normal and S2 normal. Exam reveals no gallop and no friction rub.  No murmur heard. Pulmonary/Chest: Effort normal and breath sounds normal. No respiratory distress. He exhibits no tenderness.  Abdominal: Soft. Normal appearance and bowel sounds are normal. There is no hepatosplenomegaly. There is tenderness in the suprapubic area. There is no rebound, no guarding, no tenderness at McBurney's point and negative Murphy's sign. No hernia.  Musculoskeletal: Normal range of motion.  Neurological: He is alert and oriented to person, place, and time. He has normal strength. No cranial nerve deficit or sensory deficit. Coordination normal. GCS eye  subscore is 4. GCS verbal subscore is 5. GCS motor subscore is 6.  Bilateral ataxia with finger-to-nose, and equal strength and sensation bilaterally, no pronator drift  Skin: Skin is warm, dry and intact. No rash noted. No cyanosis.  Psychiatric: He has a normal mood and affect. His speech is normal and behavior is normal. Thought content normal.  Nursing note and vitals reviewed.    ED Treatments / Results  Labs (all labs ordered are listed, but only abnormal results are displayed) Labs Reviewed  CBC WITH DIFFERENTIAL/PLATELET - Abnormal; Notable for the following components:      Result Value   WBC 11.7 (*)    RBC 4.09 (*)    MCV 100.5 (*)    Neutro Abs 9.3 (*)    All other components within normal limits  COMPREHENSIVE METABOLIC PANEL - Abnormal; Notable for the following components:   Glucose, Bld 132 (*)    BUN 28 (*)    Creatinine, Ser 1.67 (*)    Albumin 3.3 (*)    GFR calc non Af Amer 35 (*)    GFR calc Af Amer 40 (*)    All other components within normal limits  BRAIN NATRIURETIC PEPTIDE - Abnormal; Notable for the following components:   B Natriuretic Peptide 165.4 (*)    All other components within normal limits   URINALYSIS, ROUTINE W REFLEX MICROSCOPIC - Abnormal; Notable for the following components:   Hgb urine dipstick SMALL (*)    Leukocytes, UA TRACE (*)    Bacteria, UA RARE (*)    All other components within normal limits  URINE CULTURE  PROTIME-INR  I-STAT CG4 LACTIC ACID, ED  I-STAT TROPONIN, ED    EKG None  Radiology Ct Head Wo Contrast  Result Date: 07/25/2017 CLINICAL DATA:  Syncopal episode, loss of consciousness. Recently discharged from hospital for urinary tract infection. History of stroke, atrial fibrillation. EXAM: CT HEAD WITHOUT CONTRAST TECHNIQUE: Contiguous axial images were obtained from the base of the skull through the vertex without intravenous contrast. COMPARISON:  CT HEAD September 07, 2016 FINDINGS: BRAIN: No intraparenchymal hemorrhage, mass effect nor midline shift. Slight blurring of the RIGHT parietal gray-white matter junction. Smaller RIGHT frontal lobe encephalomalacia. Confluent LEFT temporoparietal encephalomalacia with ex vacuo dilatation subjacent ventricle. Moderate to severe parenchymal brain volume loss. Confluent supratentorial white matter hypodensities. Old RIGHT cerebellar infarct. Old LEFT basal ganglia infarct. Old small LEFT occipital lobe infarct. No abnormal extra-axial fluid collections. VASCULAR: Moderate calcific atherosclerosis of the carotid siphons. SKULL: No skull fracture. Osteopenia. No significant scalp soft tissue swelling. SINUSES/ORBITS: Atretic RIGHT maxillary sinus with bony remodeling consistent with chronic sinusitis. Mild maxillary sinus mucosal thickening. Mastoid air cells are well aerated. Status post bilateral ocular lens implants. Enophthalmos. OTHER: None. IMPRESSION: 1. Acute small RIGHT parietal/MCA territory nonhemorrhagic infarct. Old RIGHT frontal/MCA territory infarct. 2. Old large LEFT MCA territory and small LEFT PCA territory infarct. Old RIGHT cerebellar infarcts. Old LEFT basal ganglia infarct. 3. Moderate to severe chronic  small vessel ischemic changes. 4. Moderate to severe parenchymal brain volume loss. 5. Acute findings discussed with and reconfirmed by Dr.CHRISTOPHER POLLINA on 07/25/2017 at 2:24 am. Electronically Signed   By: Awilda Metro M.D.   On: 07/25/2017 02:26   Ct Abdomen Pelvis W Contrast  Result Date: 07/25/2017 CLINICAL DATA:  Syncopal episode after standing up. Vomiting. On antibiotics for urinary tract infection. History of appendectomy, hemorrhoid surgery. EXAM: CT ABDOMEN AND PELVIS WITH CONTRAST TECHNIQUE: Multidetector CT imaging of the abdomen and  pelvis was performed using the standard protocol following bolus administration of intravenous contrast. CONTRAST:  OMNIPAQUE IOHEXOL 300 MG/ML  SOLN COMPARISON:  Renal ultrasound July 13, 2017 FINDINGS: LOWER CHEST: Bilateral lower lobe atelectasis/scarring. 3 mm RIGHT lower lobe ground-glass pulmonary nodule, no routine indicated follow-up. The heart size is mildly enlarged. No pericardial effusion. Mild coronary artery calcification. Patchy hypodensities LEFT atrium. Enlarged heterogeneous RIGHT atrium. HEPATOBILIARY: Liver and gallbladder are normal. PANCREAS: Normal. SPLEEN: Splenosis. ADRENALS/URINARY TRACT: Kidneys are orthotopic, demonstrating symmetric enhancement. No nephrolithiasis, hydronephrosis or solid renal masses. Complex lobulated 5 cm RIGHT lower pole renal cyst with thin calcified septations. Bilateral simple renal cysts measuring to 2.6 cm. Exophytic dense 1 cm LEFT lower pole proteinaceous cysts. The unopacified ureters are normal in course and caliber. Delayed imaging through the kidneys demonstrates symmetric prompt contrast excretion within the proximal urinary collecting system. Urinary bladder is decompressed by Foley catheter. 19 mm LEFT adrenal nodule (20 Hounsfield on early phase, 15 Hounsfield units on delayed phase). STOMACH/BOWEL: The stomach, small and large bowel are normal in course and caliber without inflammatory  changes. Mild colonic diverticulosis. VASCULAR/LYMPHATIC: Mildly ectatic infrarenal aorta with moderate calcific atherosclerosis. No lymphadenopathy by CT size criteria. REPRODUCTIVE: In transaxial Prostatomegaly, prostate dimension is. 7.4 cm OTHER: Minimal free fluid in the pelvis may be physiologic. MUSCULOSKELETAL: Nonacute. Small fat containing inguinal hernias. RIGHT anterior abdominal wall scarring. Osteopenia. Ankylosis of the sacroiliac joints. Old RIGHT L3 transverse process fracture. Multiple Schmorl's nodes. Advanced degenerative changes spine. IMPRESSION: 1. Possible filling defects LEFT atrium. Given probable recent stroke, these are suspicious for clot. 2. Prostatomegaly. Decompressed urinary bladder with Foley catheter. No obstructive uropathy. 3. **An incidental finding of potential clinical significance has been found. 19 mm LEFT adrenal nodule, likely benign. Consider 12 month follow-up CT adrenal protocol. At the time recommend close attention to complex RIGHT renal cyst.** Aortic Atherosclerosis (ICD10-I70.0). Electronically Signed   By: Awilda Metro M.D.   On: 07/25/2017 05:11   Dg Chest Port 1 View  Result Date: 07/25/2017 CLINICAL DATA:  Larey Seat hit head, shortness of breath tonight. History of CHF. EXAM: PORTABLE CHEST 1 VIEW COMPARISON:  Chest radiograph July 14, 2017 FINDINGS: Cardiac silhouette is moderately enlarged. Calcified aortic arch. Chronic interstitial changes LEFT pleural effusion focal consolidation. No pneumothorax. Osteopenia. Soft tissue planes are non suspicious. IMPRESSION: Stable cardiomegaly and mild chronic interstitial changes. Aortic Atherosclerosis (ICD10-I70.0). Electronically Signed   By: Awilda Metro M.D.   On: 07/25/2017 00:53    Procedures Procedures (including critical care time)  Medications Ordered in ED Medications  iohexol (OMNIPAQUE) 300 MG/ML solution 100 mL (100 mLs Intravenous Contrast Given 07/25/17 0441)     Initial Impression /  Assessment and Plan / ED Course  I have reviewed the triage vital signs and the nursing notes.  Pertinent labs & imaging results that were available during my care of the patient were reviewed by me and considered in my medical decision making (see chart for details).     Patient presented to the emergency department with possible syncope and a fall earlier tonight.  Neither the patient nor his wife thinks he completely lost consciousness.  Wife reports that he was really unsteady with his walking and fell backwards when trying to get to a chair.  He does take Coumadin.  Patient sent for CT head to evaluate for injury from the fall and causes of syncope.  Dr. Karie Kirks, radiology, called me with a report stating that patient had what appeared to be  in acute infarct on the CT.  I discussed this with Dr. Amada Jupiter, on-call for neurology.  He looked at the images and recommended that we confirm that this was a stroke by MRI.  Patient also complaining of abdominal pain.  He had some mild diffuse lower abdominal tenderness.  Patient went for CT scan to evaluate this.  No acute intra-abdominal pathology noted, however, there is a question of cardiac thrombus.  Patient is subtherapeutic, INR is 1.2.  Once again discussed with Dr. Amada Jupiter.  Specifically discussed anticoagulation.  He recommended holding off on anticoagulation until MRI is performed.  Patient will undergo MRI and then will require hospitalization for subtherapeutic INR, possible cardiac thrombus.  Will sign out to oncoming ER physician.  Final Clinical Impressions(s) / ED Diagnoses   Final diagnoses:  Syncope, unspecified syncope type    ED Discharge Orders    None       Gilda Crease, MD 07/25/17 [phone]

## 2017-07-25 NOTE — ED Provider Notes (Addendum)
  Physical Exam  BP 131/68   Pulse 72   Resp (!) 22   SpO2 95%   Physical Exam  ED Course/Procedures     Procedures  MDM  Pt comes in with cc of syncope. Pt had a fall, with LOC. Pt has hx of AF - on coumadin. ? Stroke on CT head. CT abd shows concerns for thrombus.  Plan: admit after MRI brain. Hold heparin until MRI completed.    Derwood KaplanNanavati, Mahi Zabriskie, MD 07/25/17 0736  11:00 AM  MRI has shown a small acute infarct. Neurology team has been consulted.  They are on their way to see the patient shortly. Medicine team is already taken the call to admit. Neurology team is cleared patient to receive heparin/   Derwood KaplanNanavati, Yovan Leeman, MD 07/25/17 1101

## 2017-07-25 NOTE — ED Notes (Signed)
Ordered meal for pt.

## 2017-07-26 ENCOUNTER — Inpatient Hospital Stay (HOSPITAL_COMMUNITY): Payer: Medicare Other

## 2017-07-26 DIAGNOSIS — R609 Edema, unspecified: Secondary | ICD-10-CM

## 2017-07-26 DIAGNOSIS — N183 Chronic kidney disease, stage 3 (moderate): Secondary | ICD-10-CM

## 2017-07-26 DIAGNOSIS — I482 Chronic atrial fibrillation: Secondary | ICD-10-CM

## 2017-07-26 DIAGNOSIS — I2699 Other pulmonary embolism without acute cor pulmonale: Secondary | ICD-10-CM

## 2017-07-26 DIAGNOSIS — I5032 Chronic diastolic (congestive) heart failure: Secondary | ICD-10-CM

## 2017-07-26 LAB — LIPID PANEL
CHOLESTEROL: 159 mg/dL (ref 0–200)
HDL: 43 mg/dL (ref >40)
LDL CALC: 103 mg/dL — AB (ref 0–99)
Total CHOL/HDL Ratio: 3.7 RATIO
Triglycerides: 65 mg/dL (ref <150)
VLDL: 13 mg/dL (ref 0–40)

## 2017-07-26 LAB — HEPARIN LEVEL (UNFRACTIONATED): HEPARIN UNFRACTIONATED: 0.57 [IU]/mL (ref 0.30–0.70)

## 2017-07-26 LAB — PROTIME-INR
INR: 1.23
PROTHROMBIN TIME: 15.4 s — AB (ref 11.4–15.2)

## 2017-07-26 LAB — HEMOGLOBIN A1C
Hgb A1c MFr Bld: 5.8 % — ABNORMAL HIGH (ref 4.8–5.6)
Mean Plasma Glucose: 119.76 mg/dL

## 2017-07-26 NOTE — Progress Notes (Signed)
Nutrition Brief Note  RD consulted for CVA  Patient with PMH HTN, HLD, embolic CVA, vascular dementia, CKD 3, CHF, CAD presents with small acute CVA,    Wt Readings from Last 15 Encounters:  07/25/17 209 lb 7 oz (95 kg)  02/21/17 216 lb 8 oz (98.2 kg)  10/06/16 208 lb 8 oz (94.6 kg)  09/16/16 198 lb 8 oz (90 kg)  06/28/16 216 lb (98 kg)  03/25/16 216 lb 8 oz (98.2 kg)  12/22/15 224 lb 12.8 oz (102 kg)  12/06/15 227 lb (103 kg)  12/20/14 227 lb 1.9 oz (103 kg)  10/31/14 229 lb 8 oz (104.1 kg)  09/03/13 226 lb 12.8 oz (102.9 kg)  05/04/13 233 lb (105.7 kg)  01/01/13 227 lb (103 kg)  09/01/12 228 lb 12.8 oz (103.8 kg)  06/22/12 222 lb 12 oz (101 kg)    Spoke with patient and family at bedside. Patient's wife states patient eats good, normally eats 3 meals a day, and "will eat the legs off the table if you let him." UBW of 209 pounds. Exhibits some weight fluctuations over the past year but no significant weight loss. Ate 100% of breakfast + a country ham biscuit his [place] from [place].   Body mass index is 28.4 kg/m. Patient meets criteria for overweight based on current BMI.   Current diet order is Heart Healthy, patient is consuming approximately 100% of meals at this time.   Labs reviewed Medications reviewed and include:  Omega-3's Heparin gtt  No nutrition interventions warranted at this time. If nutrition issues arise, please consult RD.   Dionne AnoWilliam M. Sartaj Hoskin, MS, RD LDN Inpatient Clinical Dietitian Pager [phone]

## 2017-07-26 NOTE — Plan of Care (Signed)
progressing 

## 2017-07-26 NOTE — Progress Notes (Addendum)
Update 07/26/17 09:54: Pt with newly diagnosed R DVT and B PEs. Will hold OT eval for today.    OT Cancellation Note  Patient Details Name: Ladean RayaCharles Lloyd Chui MRN: [id number] DOB: 06-21-1925   Cancelled Treatment:    Reason Eval/Treat Not Completed: Patient at procedure or test/ unavailable. Pt is at Vascular. Plan to reattempt.  Raynald KempKathryn Jaidyn Usery OTR/L Pager: [phone]  07/26/2017, 9:05 AM

## 2017-07-26 NOTE — Progress Notes (Signed)
CM consulted for benefits check for Eliquis, Xarelto and Pradaxa for the patient. Mr [name] does not have drug coverage and has been getting his meds filled at [facility]. CM called and spoke to the pharmacy at [facility] and the cheapest medication would be Eliquis at $465.00/ month. Pt not able to afford any of the medications d/t them each being out of pocket without insurance coverage.

## 2017-07-26 NOTE — Progress Notes (Signed)
STROKE TEAM PROGRESS NOTE  HPI ( Dr Aroor) [name] is an 84 y.o. male with afib --Per chart "patient was told to temporarily stop warfarin, he he was restarted on 6 June however due to complications and continued urinary bleeding this was held.  He was discharged on this last Saturday and was to be followed up with cardiology this coming up Wednesday which would have been 4 days off the Coumadin.  Patient presents to the hospital today with an INR of 1.2.  Patient was brought to the hospital secondary to a syncopal episode.  Patient apparently was at the sink when he went to walk to the table he suddenly fell and hit his head on the table.  The loss of consciousness was only for short period of time and he quickly awoke.  There was no shaking, jerking, urinary incontinence.  However he does have a Foley so the incontinence would be hard to detect.  Currently he is back to his baseline.  Neurology was consulted secondary to MRI findings.  MRI showed a small  acute nonhemorrhagic posterior right frontal lobe infarct.    Date last known well: Date: 07/25/2017 Time last known well: Time: 07:00 tPA Given: No: Out of window and symptoms resolved NIH stroke scale of 2   INTERVAL HISTORY His wife and son are at the bedside.   He presented with a syncopal episode and denied any focal extremity weakness or strokelike symptom. MRI scan shows a tiny punctate right parietal embolic infarct likely asymptomatic. Patient also has pulmonary embolism and current clinical presentation is likely symptomatic from this  Vitals:   07/25/17 1849 07/25/17 2023 07/25/17 2352 07/26/17 0351  BP: (!) 148/72 (!) 124/109 (!) 152/80 (!) 166/57  Pulse: 86 (!) 57 69 67  Resp: 20 18 18 20   Temp: 98.4 F (36.9 C) 98.4 F (36.9 C) 98.7 F (37.1 C) 97.8 F (36.6 C)  TempSrc: Axillary Oral Oral Oral  SpO2: 95% (!) 81% 96% 95%  Weight:      Height:        CBC:  Recent Labs  Lab 07/25/17 0051  WBC 11.7*   NEUTROABS 9.3*  HGB 13.1  HCT 41.1  MCV 100.5*  PLT 246    Basic Metabolic Panel:  Recent Labs  Lab 07/25/17 0051  NA 137  K 4.0  CL 101  CO2 26  GLUCOSE 132*  BUN 28*  CREATININE 1.67*  CALCIUM 10.3   Lipid Panel:     Component Value Date/Time   CHOL 159 07/26/2017 0640   TRIG 65 07/26/2017 0640   HDL 43 07/26/2017 0640   CHOLHDL 3.7 07/26/2017 0640   VLDL 13 07/26/2017 0640   LDLCALC 103 (H) 07/26/2017 0640   HgbA1c:  Lab Results  Component Value Date   HGBA1C 5.8 (H) 07/26/2017   Urine Drug Screen: No results found for: LABOPIA, COCAINSCRNUR, LABBENZ, AMPHETMU, THCU, LABBARB  Alcohol Level No results found for: ETH  IMAGING Ct Angio Head W Or Wo Contrast  Result Date: 07/25/2017 CLINICAL DATA:  Stroke follow-up EXAM: CT ANGIOGRAPHY HEAD AND NECK TECHNIQUE: Multidetector CT imaging of the head and neck was performed using the standard protocol during bolus administration of intravenous contrast. Multiplanar CT image reconstructions and MIPs were obtained to evaluate the vascular anatomy. Carotid stenosis measurements (when applicable) are obtained utilizing NASCET criteria, using the distal internal carotid diameter as the denominator. CONTRAST:  50 cc Isovue 370 intravenous COMPARISON:  Head CT from earlier today FINDINGS:  CT HEAD FINDINGS Brain: Remote infarcts in the posterior left cerebrum and right cerebellum. Small acute infarct in the right cerebrum on previous brain MRI is not resolved. There is a small right lateral frontal infarct that is remote by MRI. Advanced chronic small vessel ischemia in the cerebral white matter. No hemorrhage, hydrocephalus, or masslike finding. Vascular: See below Skull: No acute or aggressive finding Sinuses: Clear Orbits: Bilateral cataract resection Review of the MIP images confirms the above findings CTA NECK FINDINGS Aortic arch: Atherosclerotic calcification.  No acute finding. Right carotid system: Moderate primarily calcified  plaque at the common carotid bifurcation and ICA bulb without flow limiting stenosis or ulceration. Left carotid system: Moderate proximal ICA calcified plaque with stenosis measuring up to 50% on coronal reformats. No ulceration or dissection. Vertebral arteries: Proximal subclavian plaque without flow limiting stenosis. Moderate atheromatous narrowing of the right vertebral origin. Both vertebral arteries are patent to the dura. Skeleton: Degenerative changes without acute or aggressive finding. Other neck: No acute finding Upper chest: Right lobar and multiple bilateral segmental to subsegmental pulmonary emboli as marked on series 13 these appear recent/acute. Review of the MIP images confirms the above findings CTA HEAD FINDINGS Anterior circulation: Extensive atherosclerotic plaque on the carotid siphons. Negative for branch occlusion. High-grade left M2 branch stenosis, see axial MIPS. Hypoplastic left A1 segment. Negative for aneurysm Posterior circulation: Vertebrobasilar arteries are smooth and diffusely patent. Fetal type left PCA. Mild-to-moderate bilateral PCA atheromatous irregularity. Venous sinuses: Patent on the delayed phase. Anatomic variants: As above Delayed phase: No abnormal intracranial enhancement. Critical Value/emergent results were called by telephone at the time of interpretation on 07/25/2017 at 1:19 pm to Dr. Junious Silk , who verbally acknowledged these results. Review of the MIP images confirms the above findings IMPRESSION: 1. Multiple acute pulmonary emboli bilaterally. 2. No emergent large vessel occlusion. 3. Cervical carotid atherosclerosis with up to 50% ICA stenosis on the left. 4. Moderate right vertebral origin stenosis. 5. High-grade left M2 branch stenosis. Electronically Signed   By: Marnee Spring M.D.   On: 07/25/2017 13:27   Ct Head Wo Contrast  Result Date: 07/25/2017 CLINICAL DATA:  Syncopal episode, loss of consciousness. Recently discharged from hospital for  urinary tract infection. History of stroke, atrial fibrillation. EXAM: CT HEAD WITHOUT CONTRAST TECHNIQUE: Contiguous axial images were obtained from the base of the skull through the vertex without intravenous contrast. COMPARISON:  CT HEAD September 07, 2016 FINDINGS: BRAIN: No intraparenchymal hemorrhage, mass effect nor midline shift. Slight blurring of the RIGHT parietal gray-white matter junction. Smaller RIGHT frontal lobe encephalomalacia. Confluent LEFT temporoparietal encephalomalacia with ex vacuo dilatation subjacent ventricle. Moderate to severe parenchymal brain volume loss. Confluent supratentorial white matter hypodensities. Old RIGHT cerebellar infarct. Old LEFT basal ganglia infarct. Old small LEFT occipital lobe infarct. No abnormal extra-axial fluid collections. VASCULAR: Moderate calcific atherosclerosis of the carotid siphons. SKULL: No skull fracture. Osteopenia. No significant scalp soft tissue swelling. SINUSES/ORBITS: Atretic RIGHT maxillary sinus with bony remodeling consistent with chronic sinusitis. Mild maxillary sinus mucosal thickening. Mastoid air cells are well aerated. Status post bilateral ocular lens implants. Enophthalmos. OTHER: None. IMPRESSION: 1. Acute small RIGHT parietal/MCA territory nonhemorrhagic infarct. Old RIGHT frontal/MCA territory infarct. 2. Old large LEFT MCA territory and small LEFT PCA territory infarct. Old RIGHT cerebellar infarcts. Old LEFT basal ganglia infarct. 3. Moderate to severe chronic small vessel ischemic changes. 4. Moderate to severe parenchymal brain volume loss. 5. Acute findings discussed with and reconfirmed by Dr.CHRISTOPHER POLLINA on 07/25/2017 at  2:24 am. Electronically Signed   By: Awilda Metro M.D.   On: 07/25/2017 02:26   Ct Angio Neck W Or Wo Contrast  Result Date: 07/25/2017 CLINICAL DATA:  Stroke follow-up EXAM: CT ANGIOGRAPHY HEAD AND NECK TECHNIQUE: Multidetector CT imaging of the head and neck was performed using the standard  protocol during bolus administration of intravenous contrast. Multiplanar CT image reconstructions and MIPs were obtained to evaluate the vascular anatomy. Carotid stenosis measurements (when applicable) are obtained utilizing NASCET criteria, using the distal internal carotid diameter as the denominator. CONTRAST:  50 cc Isovue 370 intravenous COMPARISON:  Head CT from earlier today FINDINGS: CT HEAD FINDINGS Brain: Remote infarcts in the posterior left cerebrum and right cerebellum. Small acute infarct in the right cerebrum on previous brain MRI is not resolved. There is a small right lateral frontal infarct that is remote by MRI. Advanced chronic small vessel ischemia in the cerebral white matter. No hemorrhage, hydrocephalus, or masslike finding. Vascular: See below Skull: No acute or aggressive finding Sinuses: Clear Orbits: Bilateral cataract resection Review of the MIP images confirms the above findings CTA NECK FINDINGS Aortic arch: Atherosclerotic calcification.  No acute finding. Right carotid system: Moderate primarily calcified plaque at the common carotid bifurcation and ICA bulb without flow limiting stenosis or ulceration. Left carotid system: Moderate proximal ICA calcified plaque with stenosis measuring up to 50% on coronal reformats. No ulceration or dissection. Vertebral arteries: Proximal subclavian plaque without flow limiting stenosis. Moderate atheromatous narrowing of the right vertebral origin. Both vertebral arteries are patent to the dura. Skeleton: Degenerative changes without acute or aggressive finding. Other neck: No acute finding Upper chest: Right lobar and multiple bilateral segmental to subsegmental pulmonary emboli as marked on series 13 these appear recent/acute. Review of the MIP images confirms the above findings CTA HEAD FINDINGS Anterior circulation: Extensive atherosclerotic plaque on the carotid siphons. Negative for branch occlusion. High-grade left M2 branch stenosis, see  axial MIPS. Hypoplastic left A1 segment. Negative for aneurysm Posterior circulation: Vertebrobasilar arteries are smooth and diffusely patent. Fetal type left PCA. Mild-to-moderate bilateral PCA atheromatous irregularity. Venous sinuses: Patent on the delayed phase. Anatomic variants: As above Delayed phase: No abnormal intracranial enhancement. Critical Value/emergent results were called by telephone at the time of interpretation on 07/25/2017 at 1:19 pm to Dr. Junious Silk , who verbally acknowledged these results. Review of the MIP images confirms the above findings IMPRESSION: 1. Multiple acute pulmonary emboli bilaterally. 2. No emergent large vessel occlusion. 3. Cervical carotid atherosclerosis with up to 50% ICA stenosis on the left. 4. Moderate right vertebral origin stenosis. 5. High-grade left M2 branch stenosis. Electronically Signed   By: Marnee Spring M.D.   On: 07/25/2017 13:27   Mr Brain Wo Contrast  Result Date: 07/25/2017 CLINICAL DATA:  84 year old male post fall. Possible syncope. Subsequent encounter. EXAM: MRI HEAD WITHOUT CONTRAST TECHNIQUE: Multiplanar, multiecho pulse sequences of the brain and surrounding structures were obtained without intravenous contrast. COMPARISON:  07/25/2017 head CT.  03/25/2010 brain MR. FINDINGS: Brain: Tiny acute nonhemorrhagic posterior right frontal lobe infarct. Moderately large remote left frontal-parietal-posterior temporal lobe infarct with blood-stained encephalomalacia. Subsequent mild dilation left lateral ventricle. Remote posterior right temporal-frontal lobe infarct. Remote left occipital lobe infarct with blood-stained encephalomalacia. Remote inferior right cerebellar infarct with blood-stained encephalomalacia. Remote partially hemorrhagic left cerebellar infarct. Scattered tiny blood breakdown products consistent with areas of prior hemorrhagic ischemia. Prominent chronic microvascular changes. Moderate global atrophy. No intracranial mass  lesion noted on this unenhanced exam.  Vascular: Major intracranial vascular structures are patent. Atherosclerotic changes suspected without large vessel occlusion. Skull and upper cervical spine: Motion degraded. Transverse ligament hypertrophy. Degenerative changes upper cervical spine. Sinuses/Orbits: Post lens replacement without acute orbital abnormality. Mild mucosal thickening inferior maxillary sinuses. Minimal mucosal thickening ethmoid sinus air cells. Other: Negative. IMPRESSION: Tiny acute nonhemorrhagic posterior right frontal lobe infarct. Multiple remote infarcts as detailed above. Chronic microvascular changes. Atrophy. Electronically Signed   By: Lacy Duverney M.D.   On: 07/25/2017 08:14   Ct Abdomen Pelvis W Contrast  Result Date: 07/25/2017 CLINICAL DATA:  Syncopal episode after standing up. Vomiting. On antibiotics for urinary tract infection. History of appendectomy, hemorrhoid surgery. EXAM: CT ABDOMEN AND PELVIS WITH CONTRAST TECHNIQUE: Multidetector CT imaging of the abdomen and pelvis was performed using the standard protocol following bolus administration of intravenous contrast. CONTRAST:  OMNIPAQUE IOHEXOL 300 MG/ML  SOLN COMPARISON:  Renal ultrasound July 13, 2017 FINDINGS: LOWER CHEST: Bilateral lower lobe atelectasis/scarring. 3 mm RIGHT lower lobe ground-glass pulmonary nodule, no routine indicated follow-up. The heart size is mildly enlarged. No pericardial effusion. Mild coronary artery calcification. Patchy hypodensities LEFT atrium. Enlarged heterogeneous RIGHT atrium. HEPATOBILIARY: Liver and gallbladder are normal. PANCREAS: Normal. SPLEEN: Splenosis. ADRENALS/URINARY TRACT: Kidneys are orthotopic, demonstrating symmetric enhancement. No nephrolithiasis, hydronephrosis or solid renal masses. Complex lobulated 5 cm RIGHT lower pole renal cyst with thin calcified septations. Bilateral simple renal cysts measuring to 2.6 cm. Exophytic dense 1 cm LEFT lower pole  proteinaceous cysts. The unopacified ureters are normal in course and caliber. Delayed imaging through the kidneys demonstrates symmetric prompt contrast excretion within the proximal urinary collecting system. Urinary bladder is decompressed by Foley catheter. 19 mm LEFT adrenal nodule (20 Hounsfield on early phase, 15 Hounsfield units on delayed phase). STOMACH/BOWEL: The stomach, small and large bowel are normal in course and caliber without inflammatory changes. Mild colonic diverticulosis. VASCULAR/LYMPHATIC: Mildly ectatic infrarenal aorta with moderate calcific atherosclerosis. No lymphadenopathy by CT size criteria. REPRODUCTIVE: In transaxial Prostatomegaly, prostate dimension is. 7.4 cm OTHER: Minimal free fluid in the pelvis may be physiologic. MUSCULOSKELETAL: Nonacute. Small fat containing inguinal hernias. RIGHT anterior abdominal wall scarring. Osteopenia. Ankylosis of the sacroiliac joints. Old RIGHT L3 transverse process fracture. Multiple Schmorl's nodes. Advanced degenerative changes spine. IMPRESSION: 1. Possible filling defects LEFT atrium. Given probable recent stroke, these are suspicious for clot. 2. Prostatomegaly. Decompressed urinary bladder with Foley catheter. No obstructive uropathy. 3. **An incidental finding of potential clinical significance has been found. 19 mm LEFT adrenal nodule, likely benign. Consider 12 month follow-up CT adrenal protocol. At the time recommend close attention to complex RIGHT renal cyst.** Aortic Atherosclerosis (ICD10-I70.0). Electronically Signed   By: Awilda Metro M.D.   On: 07/25/2017 05:11   Dg Chest Port 1 View  Result Date: 07/25/2017 CLINICAL DATA:  Larey Seat hit head, shortness of breath tonight. History of CHF. EXAM: PORTABLE CHEST 1 VIEW COMPARISON:  Chest radiograph July 14, 2017 FINDINGS: Cardiac silhouette is moderately enlarged. Calcified aortic arch. Chronic interstitial changes LEFT pleural effusion focal consolidation. No pneumothorax.  Osteopenia. Soft tissue planes are non suspicious. IMPRESSION: Stable cardiomegaly and mild chronic interstitial changes. Aortic Atherosclerosis (ICD10-I70.0). Electronically Signed   By: Awilda Metro M.D.   On: 07/25/2017 00:53   2D Echocardiogram  - Left ventricle: moderate basal septal hypertrophy. Distal septal and apical hypokinesis. The cavity size was normal. Wall thickness was increased in a pattern of mild LVH. Systolic function was normal. The estimated ejection fraction was in the range  of 50% to 55%. The study is not technically sufficient to allow evaluation of LV diastolic function. - Aortic valve: There was mild stenosis. There was trivial regurgitation. Valve area (VTI): 1.14 cm^2. Valve area (Vmax): 1.15 cm^2. Valve area (Vmean): 1.19 cm^2. - Mitral valve: Calcified annulus. There was mild regurgitation. - Left atrium: The atrium was severely dilated. - Right atrium: The atrium was moderately dilated. - Impressions: TTE insufficient to r/o LAA thrombus Patient in flutter during exam Impressions:   TTE insufficient to r/o LAA thrombus Patient in flutter during exam   PHYSICAL EXAM Pleasant elderly Caucasian male in mild respiratory distress. He is on nasal cannula oxygen. . Afebrile. Head is nontraumatic. Neck is supple without bruit.    Cardiac exam no murmur or gallop. Lungs are clear to auscultation. Distal pulses are well felt. Neurological Exam ;  Awake  Alert oriented x 3. Normal speech and language.diminished attention, registration and recall.eye movements full without nystagmus.fundi were not visualized. Vision acuity and fields appear normal. Hearing is normal. Palatal movements are normal. Face symmetric. Tongue midline. Normal strength, tone, reflexes and coordination.but poor effort in lower extremity testing Normal sensation. Gait deferred.  ASSESSMENT/PLAN [name] is a 84 y.o. male with history of AF off warfarin x 4 days d/t hematuria from UTI,  CAD, CHF, CKD, hx stroke, HTN, HLD, baseline cognitive deficits presenting with syncope - fell and hit head, LOC. Found to have PE, DVT and tiny posterior R frontal infarct.   Stroke:  Incidental tiny R posterior frontal infarct embolic secondary to known atrial fibrillation in setting of PE and DVT  CT head small R parietal infarct. Old R frontal infarct. Old L MCA and small L PCA, old R cerebellar, Old L BG infarcts. Mod to severe Small vessel disease and Atrophy.  CTA head & neck mult B PEs, no LVO. L ICA 50% stenosis. High grade L M2 branch stenosis. Atherosclerosis.  MRI  Tiny R post frontal lobe infarct. Mult old infarcts. Small vessel disease.  2D Echo  Left ventricle: moderate basal septal hypertrophy. Distal septal and apical hypokinesis. The cavity size was normal. Wall thickness was increased in a pattern of mild LVH. Systolic function was normal. The estimated ejection fraction was in the range of 50% to 55%  LDL 103  HgbA1c 5.8  Iv heparin for VTE prophylaxis Diet Order           Diet Heart Room service appropriate? Yes; Fluid consistency: Thin  Diet effective now           No antithrombotic prior to admission, now on heparin IV.    Therapy recommendations: pending  Disposition:  pending  Hypertension   Stable . Permissive hypertension (OK if < 220/120) but gradually normalize in 5-7 days . Long-term BP goal normotensive  Hyperlipidemia  Home meds: omega 3 fatty acids   LDL 103, goal < 70  Add lipitor 40 mg  Continue statin at discharge  Diabetes type II  HgbA1c 5.8, goal < 7.0   Controlled  Other Stroke Risk Factors   Advanced age   Cigarette smoker -quit 1952   ETOH use,  -no  Obesity- no , Body mass index is 28.4 kg/m.,    NoHx stroke/TIA  H/o CAD s/p MI     Other Active Problems  CKD, Cr 1.67  Pulm emboli  Hospital day # 1  I have personally examined this patient, reviewed notes,HPI, independently viewed imaging studies,  participated in medical decision making and  plan of care.ROS completed by me personally and pertinent positives fully documented  I have made any additions or clarifications directly to the above note. Agree with note above.  The patient had held anticoagulation due to recent GI bleed but unfortunately has developed acute pulmonary embolism, DVT as well as as silent right parietal infarct and needs to be back on anticoagulation. Agree with short-term IV heparin and then transitioned to oral anticoagulation with eliquis/pradaxa on Xarelto. Long discussion at the bedside with the patient's son as well as wife and answered questions about his care.discussed with Dr. Margo Aye.transfer to rehabilitation when medically stable. Greater than 50% time during this 35 minute visit was spent on counseling and coordination of care about his embolic stroke, atrial fibrillation, discussion about stroke risk and risk of hemorrhage on anticoagulation and answered questions. Stroke team will sign off. Kindly call for questions.  Delia Heady, MD Medical Director [facility] Pager: [phone] 07/26/2017 2:59 PM   To contact Stroke Continuity provider, please refer to [email or web address]. After hours, contact General Neurology

## 2017-07-26 NOTE — Progress Notes (Signed)
*  Preliminary Results* Bilateral lower extremity venous duplex completed. The right lower extremity is positive for acute deep vein thrombosis involving the right mid femoral vein. The left lower extremity is negative for deep vein thrombosis. There is no evidence of Baker's cyst bilaterally.   Preliminary results discussed with Judie GrieveBryan, RN.  07/26/2017 9:23 AM Gertie FeyMichelle Dujuan Stankowski, BS, RVT, RDCS, RDMS

## 2017-07-26 NOTE — Progress Notes (Signed)
ANTICOAGULATION CONSULT NOTE - Follow Up Consult  Pharmacy Consult for Heparin Indication: atrial fibrillation  Allergies  Allergen Reactions  . Benazepril Hcl Other (See Comments)    Lethargic, nervous, nauseated    Patient Measurements: Height: 6' (182.9 cm) Weight: 209 lb 7 oz (95 kg) IBW/kg (Calculated) : 77.6 Heparin Dosing Weight:    Vital Signs: Temp: 97.9 F (36.6 C) (06/18 0937) Temp Source: Oral (06/18 0937) BP: 150/91 (06/18 0937) Pulse Rate: 70 (06/18 0937)  Labs: Recent Labs    07/25/17 0051 07/25/17 1912 07/26/17 0640  HGB 13.1  --   --   HCT 41.1  --   --   PLT 246  --   --   LABPROT 15.1  --  15.4*  INR 1.20  --  1.23  HEPARINUNFRC  --  0.32 0.57  CREATININE 1.67*  --   --     Estimated Creatinine Clearance: 35.9 mL/min (A) (by C-G formula based on SCr of 1.67 mg/dL (H)).  Assessment: 89 YOM on warfarin pta for afib, had been on hold until 6/19 d/t hematuria- patient had a follow up appointment on that day. Started on heparin- therapeutic level x2 since start. CBC wnl, no bleeding noted.  Goal of Therapy:  Heparin level 0.3-0.7 units/ml Monitor platelets by anticoagulation protocol: Yes   Plan:  Continue IV heparin at 1400 units/hr Daily Heparin level and CBC Follow for transition back to warfarin  [name] [name], PharmD, BCPS Clinical Pharmacist [phone] Please check AMION for all Hospital [facility] Pharmacy numbers 07/26/2017 10:13 AM

## 2017-07-26 NOTE — H&P (Addendum)
Progress Notes         PROGRESS NOTE  Ladean RayaCharles Lloyd Greening RUE:[id number] DOB: 1925-09-09 DOA: 07/25/2017 PCP: Eustaquio BoydenGutierrez, Javier, MD  HPI/Recap of past 24 hours: [name] is a 84 year old male with past medical history significant for hypertension, HLD, remote embolic CVA, vascular dementia, CKD 3, chronic diastolic heart failure, coronary artery disease, chronic A. Fib, recently admitted and treated for UTI and ecoli/pseudomonas/klebsiella bacteremia who presented with a syncopal episode at home.  LOC for a few seconds.  MRI done on 07/25/2017 revealed tiny acute nonhemorrhagic posterior right frontal lobe infarct and multiple remote infarcts.  Admitted for acute CVA.  Neurology consulted. Signed off today 07/26/17. Appreciate input.  Per admission H&P: <<He was recently admitted from 6/5-8 for sepsis from UTI and ecoli bacteremia. DC on Omnicef as an outpatient, although it was transitioned to Levaquin based on cultures.  Coumadin was initially held due to gross hematuria, but it appears that this was to be resumed; it appears that the family restarted it on 6/11.  However, they called back to ask about possibly changing to a NOAC and his INR on presentation is only 1.20.>>  07/26/2017: Patient seen and examined at his bedside.  He is alert but confused in the setting of dementia.  No acute events overnight. Neurology followed and signed off. Case manager consulted to assist with oral anticoag meds (eliquis or xarelto)   Assessment/Plan: Principal Problem:   Acute embolic stroke (HCC) Active Problems:   Atrial fibrillation (HCC)   History of cardioembolic cerebrovascular accident (CVA)   Hyperlipidemia   HTN (hypertension)   CKD (chronic kidney disease), stage III (HCC)   Chronic indwelling Foley catheter   History of BPH   Chronic diastolic heart failure (HCC)   ? Mural thrombus of left atrium   Gram-negative bacteremia   Bilateral pulmonary embolism (HCC)  Acute small nonhemorrhagic  posterior right frontal lobe infarct Also multiple remote infarcts Neurology following.   Suspected from non compliance with oral anticoagulation in the setting of chronic afib Heparin drip running switch to eliquis if covered by his insurance Permissive HTN C/w telemetry monitoring C/w neuro checks q4h PT/OT Swallow eval  Acute DVT RLE/Multiple acute PE Right mid femoral vein positive for DVT on 07/26/17 CT angio head and neck positive for multiple acute PE On heparin drip Do not use SCDs  Chronic A. fib suspect non compliance with oral anticoagulation post discharge home Rate controlled Allow for permissive HTN Hold metoprolol On heparin drip Hg is stable at 13.1 Start Eliquis once case manager reports approval by insurance  Recent ecoli/klebsiella/pseudomonas bacteremia From blood cx x3 taken on 07/13/17. Blood cultures sensitive to ceftriaxone C/w IV ceftriaxone Repeat blood cx x 2 peripherally  HLD lipitor 80mg   BPH/ Urinary foley catheter placed on 07/16/17 proscar Monitor urine output    Code Status: full  Family Communication: none at bedside  Disposition Plan: home when clinically stable   Consultants:  neurology  Procedures:  none   Antimicrobials: ceftriaxone  DVT prophylaxis:  heparin drip for acute DVT and chronic afib   Objective: Vitals:   07/25/17 2352 07/26/17 0351 07/26/17 0937 07/26/17 1354  BP: (!) 152/80 (!) 166/57 (!) 150/91 133/64  Pulse: 69 67 70 (!) 52  Resp: 18 20 20 16   Temp: 98.7 F (37.1 C) 97.8 F (36.6 C) 97.9 F (36.6 C) 97.8 F (36.6 C)  TempSrc: Oral Oral Oral Oral  SpO2: 96% 95% 100% 98%  Weight:      Height:  Intake/Output Summary (Last 24 hours) at 07/26/2017 1421 Last data filed at 07/26/2017 1610 Gross per 24 hour  Intake 282.93 ml  Output 600 ml  Net -317.07 ml   Filed Weights   07/25/17 1100  Weight: 95 kg (209 lb 7 oz)    Exam:  . General: 84 y.o. year-old male well developed well  nourished in no acute distress.  Alert and confused in the setting of dementia. His speech is clear. . Cardiovascular: Irregular rate and rhythm with no rubs or gallops.  No thyromegaly or JVD noted.   Marland Kitchen Respiratory: Clear to auscultation with no wheezes or rales. Good inspiratory effort. . Abdomen: Soft nontender nondistended with normal bowel sounds x4 quadrants. . Musculoskeletal: No lower extremity edema. 2/4 pulses in all 4 extremities. . Skin: No ulcerative lesions noted or rashes, . Psychiatry: Mood is appropriate for condition and setting   Data Reviewed: CBC: Recent Labs  Lab 07/25/17 0051  WBC 11.7*  NEUTROABS 9.3*  HGB 13.1  HCT 41.1  MCV 100.5*  PLT 246   Basic Metabolic Panel: Recent Labs  Lab 07/25/17 0051  NA 137  K 4.0  CL 101  CO2 26  GLUCOSE 132*  BUN 28*  CREATININE 1.67*  CALCIUM 10.3   GFR: Estimated Creatinine Clearance: 35.9 mL/min (A) (by C-G formula based on SCr of 1.67 mg/dL (H)). Liver Function Tests: Recent Labs  Lab 07/25/17 0051  AST 27  ALT 28  ALKPHOS 59  BILITOT 0.6  PROT 6.9  ALBUMIN 3.3*   No results for input(s): LIPASE, AMYLASE in the last 168 hours. No results for input(s): AMMONIA in the last 168 hours. Coagulation Profile: Recent Labs  Lab 07/22/17 07/25/17 0051 07/26/17 0640  INR 1.4* 1.20 1.23   Cardiac Enzymes: No results for input(s): CKTOTAL, CKMB, CKMBINDEX, TROPONINI in the last 168 hours. BNP (last 3 results) No results for input(s): PROBNP in the last 8760 hours. HbA1C: Recent Labs    07/26/17 0640  HGBA1C 5.8*   CBG: Recent Labs  Lab 07/25/17 2044  GLUCAP 104*   Lipid Profile: Recent Labs    07/26/17 0640  CHOL 159  HDL 43  LDLCALC 103*  TRIG 65  CHOLHDL 3.7   Thyroid Function Tests: Recent Labs    07/25/17 1549  TSH 3.617   Anemia Panel: No results for input(s): VITAMINB12, FOLATE, FERRITIN, TIBC, IRON, RETICCTPCT in the last 72 hours. Urine analysis:    Component Value  Date/Time   COLORURINE YELLOW 07/25/2017 0307   APPEARANCEUR CLEAR 07/25/2017 0307   LABSPEC 1.009 07/25/2017 0307   PHURINE 7.0 07/25/2017 0307   GLUCOSEU NEGATIVE 07/25/2017 0307   HGBUR SMALL (A) 07/25/2017 0307   BILIRUBINUR NEGATIVE 07/25/2017 0307   KETONESUR NEGATIVE 07/25/2017 0307   PROTEINUR NEGATIVE 07/25/2017 0307   UROBILINOGEN 0.2 03/24/2010 1755   NITRITE NEGATIVE 07/25/2017 0307   LEUKOCYTESUR TRACE (A) 07/25/2017 0307   Sepsis Labs: @LABRCNTIP (procalcitonin:4,lacticidven:4)  )No results found for this or any previous visit (from the past 240 hour(s)).    Studies: No results found.  Scheduled Meds: . allopurinol  100 mg Oral Daily  . aspirin  300 mg Rectal Daily   Or  . aspirin  325 mg Oral Daily  . atorvastatin  80 mg Oral q1800  . digoxin  125 mcg Oral QHS  . finasteride  5 mg Oral Daily  . furosemide  40 mg Oral Daily  . omega-3 acid ethyl esters  1 g Oral q1800    Continuous  Infusions: . ceFEPime (MAXIPIME) IV 2 g (07/26/17 1231)  . heparin 1,400 Units/hr (07/26/17 0435)     LOS: 1 day     Darlin Drop, MD [facility] Pager [phone]  If 7PM-7AM, please contact night-coverage [email or web address] Password [email or web address] 07/26/2017, 2:21 PM

## 2017-07-26 NOTE — Progress Notes (Addendum)
PT Cancellation Note  Patient Details Name: [name] MRN: [id number] DOB: 18-Aug-1925   Cancelled Treatment:    Reason Eval/Treat Not Completed: Patient not medically ready Patient with newly diagnosed R DVT and B PE's - spoke with nursing, advised to hold PT eval today.   Kipp LaurenceStephanie R Markus Casten, PT, DPT 07/26/17 11:58 AM

## 2017-07-26 NOTE — Evaluation (Signed)
Speech Language Pathology Evaluation Patient Details Name: [name] MRN: [id number] DOB: 29-May-1925 Today's Date: 07/26/2017 Time: [id number] SLP Time Calculation (min) (ACUTE ONLY): 20 min  Problem List:  Patient Active Problem List   Diagnosis Date Noted  . Acute embolic stroke (HCC) 07/25/2017  . Atrial fibrillation (HCC) 07/25/2017  . History of cardioembolic cerebrovascular accident (CVA) 07/25/2017  . Hyperlipidemia 07/25/2017  . HTN (hypertension) 07/25/2017  . CKD (chronic kidney disease), stage III (HCC) 07/25/2017  . Chronic indwelling Foley catheter 07/25/2017  . History of BPH 07/25/2017  . Chronic diastolic heart failure (HCC) 07/25/2017  . ? Mural thrombus of left atrium 07/25/2017  . Gram-negative bacteremia 07/25/2017  . Bilateral pulmonary embolism (HCC) 07/25/2017  . Urinary tract infection associated with indwelling urethral catheter (HCC)   . Sepsis (HCC) 07/13/2017  . Acute lower UTI 07/13/2017  . AKI (acute kidney injury) (HCC) 07/13/2017  . Renal insufficiency 02/27/2017  . Health maintenance examination 10/06/2016  . Primary hyperparathyroidism (HCC) 10/06/2016  . Sacral pressure sore 10/06/2016  . Urinary incontinence 10/06/2016  . Tinea pedis 03/26/2016  . Medicare annual wellness visit, subsequent 10/31/2014  . Advanced care planning/counseling discussion 10/31/2014  . DNR (do not resuscitate) 10/31/2014  . Dyspnea on exertion 09/03/2013  . Encounter for therapeutic drug monitoring 05/04/2013  . Gout 09/01/2012  . Lesion of skin of cheek 06/23/2012  . Cognitive impairment   . Permanent atrial fibrillation (HCC) 06/08/2010  . Long term (current) use of anticoagulants 06/08/2010  . Right bundle branch block 06/08/2010  . Aphasia, post-stroke 06/08/2010  . Aortic stenosis, mild 06/08/2010  . Chronic diastolic congestive heart failure (HCC) 06/08/2010  . Hypercholesterolemia 06/08/2010  . BPH (benign prostatic hyperplasia) 06/08/2010  .  Essential hypertension 06/08/2010   Past Medical History:  Past Medical History:  Diagnosis Date  . Atrial fibrillation (HCC)    chronic  . BPH (benign prostatic hyperplasia)   . CAD (coronary artery disease)    s/p MI, unsure when  . Chronic diastolic heart failure (HCC)   . Chronic indwelling Foley catheter   . CKD (chronic kidney disease), stage III (HCC)   . Cognitive impairment    from stroke  . 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 DOE (dyspnea on exertion)   . Gout   . HTN (hypertension)   . Hyperlipidemia    Past Surgical History:  Past Surgical History:  Procedure Laterality Date  . APPENDECTOMY  1994  . CARDIOVASCULAR STRESS TEST  01/11/2008   evidence of an old scar of the septum and a larger apical scar but no reversible ischemia, EF 48%  . CATARACT EXTRACTION Left 2008   [place]  . EXCISION MORTON'S NEUROMA  1995   x 2  . HEMORRHOID SURGERY    . TONSILLECTOMY  1964  . US ECHOCARDIOGRAPHY  12/19/2007   EF 55-60%, mild aortic stenosis and mild pulmonary hypertension and mild mitral regurgitation   HPI:  Ivan RayaCharles Lloyd Ramsey is a 84 y.o. 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  He was in the kitchen with his wife and turned around and passed out.  LOC for a few seconds.  His neuro exam is difficult due to underlying dementia but he reports decreased sensation of the right face and leg and may have mild right  facial droop.  He does have some dysarthria and slowing of his speech.  MRI shows a tiny acute CVA.   Assessment / Plan / Recommendation Clinical Impression   Pt positive for DVT but was cleared for bed level ST evaluation per RN.  Pt presents with moderately severe cognitive-linguistic deficits.  Pt is oriented to self only and needed max to total cues  to reorient to place, date, and situation.  Wife and son report this is different from his baseline. Pt has decreased sustained attention to tasks, poor storage and retrieval of information, and poor intellectual awareness of his deficits.  These deficits affect all higher level cognitive processes.  Pt also has expressive language deficits which appear to be cognitive and motor based.  Pt has language of confusion but also has difficulty when repeating words of increasing length with errors characterized by sound distortions and phoneme omissions.  Given the abovementioned deficits, pt would benefit from skilled ST while inpatient in order to maximize functional independence and reduce burden of care prior to discharge.  Anticipate that pt will need 24/7 supervision at discharge in addition to ST follow up at next level of care.      SLP Assessment  SLP Recommendation/Assessment: Patient needs continued Speech Lanaguage Pathology Services SLP Visit Diagnosis: Cognitive communication deficit (R41.841)    Follow Up Recommendations  Skilled Nursing facility;24 hour supervision/assistance    Frequency and Duration min 2x/week         SLP Evaluation Cognition  Overall Cognitive Status: Impaired/Different from baseline Arousal/Alertness: Awake/alert Orientation Level: Oriented to person;Disoriented to place;Disoriented to time;Disoriented to situation Attention: Sustained Sustained Attention: Impaired Sustained Attention Impairment: Verbal basic;Functional basic Memory: Impaired Memory Impairment: Storage deficit;Retrieval deficit Awareness: Impaired Awareness Impairment: Intellectual impairment Behaviors: Perseveration;Restless Safety/Judgment: Impaired       Comprehension  Auditory Comprehension Overall Auditory Comprehension: Other (comment)(impacted by cognition ) Yes/No Questions: Impaired Basic Biographical Questions: 51-75% accurate Commands: Impaired One Step Basic Commands:  75-100% accurate Two Step Basic Commands: 50-74% accurate Interfering Components: Motor planning;Working [employer] fast streamer;Attention;Hearing EffectiveTechniques: Visual/Gestural cues;Repetition;Pausing;Extra processing time;Increased volume;Slowed speech;Stressing words    Expression Expression Primary Mode of Expression: Verbal Verbal Expression Overall Verbal Expression: Other (comment)(impacted by cognition ) Initiation: No impairment Automatic Speech: Social Response Level of Generative/Spontaneous Verbalization: Conversation Repetition: Impaired Level of Impairment: Word level(multisyllabic) Naming: No impairment Pragmatics: Impairment Impairments: Topic maintenance Interfering Components: Attention Other Verbal Expression Comments: language of confusion    Oral / Motor  Oral Motor/Sensory Function Overall Oral Motor/Sensory Function: Within functional limits Motor Speech Overall Motor Speech: Impaired Respiration: Impaired Level of Impairment: Phrase Phonation: Low vocal intensity Resonance: Within functional limits Articulation: Within functional limitis Intelligibility: Intelligibility reduced Word: 75-100% accurate Phrase: 50-74% accurate Sentence: 50-74% accurate Conversation: 50-74% accurate Motor Planning: Impaired Level of Impairment: Word(multisyllabic) Motor Speech Errors: Unaware;Inconsistent;Groping for words Interfering Components: Hearing loss   GO                    Hamna Asa, Melanee Spry 07/26/2017, 2:50 PM

## 2017-07-27 DIAGNOSIS — R7881 Bacteremia: Secondary | ICD-10-CM

## 2017-07-27 LAB — COMPREHENSIVE METABOLIC PANEL
ALT: 21 U/L (ref 17–63)
AST: 26 U/L (ref 15–41)
Albumin: 3.2 g/dL — ABNORMAL LOW (ref 3.5–5.0)
Alkaline Phosphatase: 57 U/L (ref 38–126)
Anion gap: 9 (ref 5–15)
BILIRUBIN TOTAL: 1 mg/dL (ref 0.3–1.2)
BUN: 24 mg/dL — AB (ref 6–20)
CALCIUM: 10.4 mg/dL — AB (ref 8.9–10.3)
CO2: 26 mmol/L (ref 22–32)
CREATININE: 1.51 mg/dL — AB (ref 0.61–1.24)
Chloride: 108 mmol/L (ref 101–111)
GFR calc Af Amer: 45 mL/min — ABNORMAL LOW (ref >60)
GFR, EST NON AFRICAN AMERICAN: 39 mL/min — AB (ref >60)
Glucose, Bld: 118 mg/dL — ABNORMAL HIGH (ref 65–99)
POTASSIUM: 3.7 mmol/L (ref 3.5–5.1)
Sodium: 143 mmol/L (ref 135–145)
TOTAL PROTEIN: 6.8 g/dL (ref 6.5–8.1)

## 2017-07-27 LAB — HEPARIN LEVEL (UNFRACTIONATED): Heparin Unfractionated: 0.41 IU/mL (ref 0.30–0.70)

## 2017-07-27 LAB — MAGNESIUM: MAGNESIUM: 2.1 mg/dL (ref 1.7–2.4)

## 2017-07-27 LAB — CBC
HEMATOCRIT: 39.8 % (ref 39.0–52.0)
Hemoglobin: 12.9 g/dL — ABNORMAL LOW (ref 13.0–17.0)
MCH: 31.9 pg (ref 26.0–34.0)
MCHC: 32.4 g/dL (ref 30.0–36.0)
MCV: 98.3 fL (ref 78.0–100.0)
Platelets: 198 10*3/uL (ref 150–400)
RBC: 4.05 MIL/uL — ABNORMAL LOW (ref 4.22–5.81)
RDW: 14.1 % (ref 11.5–15.5)
WBC: 13 10*3/uL — ABNORMAL HIGH (ref 4.0–10.5)

## 2017-07-27 NOTE — Progress Notes (Signed)
ANTICOAGULATION CONSULT NOTE - Follow Up Consult  Pharmacy Consult for Heparin Indication: atrial fibrillation  Allergies  Allergen Reactions  . Benazepril Hcl Other (See Comments)    Lethargic, nervous, nauseated    Patient Measurements: Height: 6' (182.9 cm) Weight: 209 lb 7 oz (95 kg) IBW/kg (Calculated) : 77.6 Heparin Dosing Weight:    Vital Signs: Temp: 97.9 F (36.6 C) (06/19 0835) Temp Source: Oral (06/19 0835) BP: 151/85 (06/19 0835) Pulse Rate: 62 (06/19 0835)  Labs: Recent Labs    07/25/17 0051 07/25/17 1912 07/26/17 0640 07/27/17 0647  HGB 13.1  --   --  12.9*  HCT 41.1  --   --  39.8  PLT 246  --   --  198  LABPROT 15.1  --  15.4*  --   INR 1.20  --  1.23  --   HEPARINUNFRC  --  0.32 0.57 0.41  CREATININE 1.67*  --   --  1.51*    Estimated Creatinine Clearance: 39.7 mL/min (A) (by C-G formula based on SCr of 1.51 mg/dL (H)).  Assessment: 89 YOM on warfarin pta for afib, had been on hold until 6/19 d/t hematuria- patient had a follow up appointment on that day. Started on heparin- level remains therapeutic this morning. CBC wnl, no bleeding noted.  Goal of Therapy:  Heparin level 0.3-0.7 units/ml Monitor platelets by anticoagulation protocol: Yes   Plan:  Continue IV heparin at 1400 units/hr Daily Heparin level and CBC Follow for transition back to warfarin- noted DOAC options would be too expensive for patient (see K. Yehuda MaoWillard, Case Manager note from yesterday for more detail)  Matasha Smigelski D. Dejour Vos, PharmD, BCPS Clinical Pharmacist [phone] Please check AMION for all [facility] numbers 07/27/2017 11:20 AM

## 2017-07-27 NOTE — Progress Notes (Signed)
PROGRESS NOTE        PATIENT DETAILS Name: [name] Age: 84 y.o. Sex: male Date of Birth: [birth year] Admit Date: 07/25/2017 Admitting Physician Jonah Blue, MD ZOX:[id number], Wynona Canes, MD  Brief Narrative: Patient is a 84 y.o. male with prior history of atrial fibrillation on Coumadin, vascular dementia, hypertension, CVA presented for evaluation of a syncopal episode.  MRI brain showed a tiny nonhemorrhagic right frontal lobe infarct, CT angiogram of the neck showed incidental finding of pulmonary embolism.  Neurology consulted during this hospital stay-see below for further details.  Subjective: Mildly confused but able to answer most of my questions appropriately.  No chest pain or shortness of breath.   Assessment/Plan: Syncope: Etiology not very clear-patient with dementia and hence a poor historian.  Possibly related to acute pulmonary embolism.  Apart from atrial fibrillation no major arrhythmia seen on telemetry.  Echocardiogram showed preserved EF.   Small right posterior frontal infarct: Suspect this is mostly incidental finding-probably embolic as patient stopped his anticoagulation a few days prior to this hospital stay for possible GI bleeding.  CT angiogram of the neck did not show any acute/major stenosis, transthoracic echocardiogram with preserved EF.  Known history of atrial fibrillation.  LDL 103, A1c 5.8.  Currently on IV heparin-we will discuss with family-but if no resources/insurance to cover new anticoagulation agents-may just need to be placed back on Coumadin.  Do not think this was a Coumadin failure held due to concern for urinary bleeding.  Venous thromboembolism: Has acute DVT in the right lower extremity, and multiple acute PE seen on CT angiogram.  As noted above-early on IV heparin-we will await arrival of family and subsequently will discuss with case management-if really no other options as patient does not have  prescription coverage-may just need to be switched back to Coumadin.  As long as patient is on anticoagulation-okay to undergo therapy services evaluation  Recent history of E. coli/Klebsiella/Pseudomonas bacteremia: No longer on any antimicrobial therapy-was on 6/18 pending   Dyslipidemia: Continue statin  Atrial fibrillation: Rate controlled-continue digoxin-see above regarding anticoagulation.  BPH: Continue finasteride  Dementia: Suspect at baseline-minimally confused.  Telemetry (independently reviewed):Afib  DVT Prophylaxis: Full dose anticoagulation with Heparin  Code Status: Full code  Family Communication: None at bedside  Disposition Plan: Remain inpatient-may require SNF on discharge  Antimicrobial agents: Anti-infectives (From admission, onward)   Start     Dose/Rate Route Frequency Ordered Stop   07/25/17 1300  ceFEPIme (MAXIPIME) 2 g in sodium chloride 0.9 % 100 mL IVPB     2 g 200 mL/hr over 30 Minutes Intravenous Daily 07/25/17 1248     07/25/17 1145  levofloxacin (LEVAQUIN) tablet 750 mg  Status:  Discontinued     750 mg Oral Every 48 hours 07/25/17 1137 07/25/17 1242      Procedures: None  CONSULTS:  neurology  Time spent: 25 minutes-Greater than 50% of this time was spent in counseling, explanation of diagnosis, planning of further management, and coordination of care.  MEDICATIONS: Scheduled Meds: . allopurinol  100 mg Oral Daily  . aspirin  300 mg Rectal Daily   Or  . aspirin  325 mg Oral Daily  . atorvastatin  80 mg Oral q1800  . digoxin  125 mcg Oral QHS  . finasteride  5 mg Oral Daily  . omega-3 acid ethyl esters  1  g Oral q1800   Continuous Infusions: . ceFEPime (MAXIPIME) IV 2 g (07/27/17 0951)  . heparin 1,400 Units/hr (07/26/17 0435)   PRN Meds:.acetaminophen **OR** acetaminophen (TYLENOL) oral liquid 160 mg/5 mL **OR** acetaminophen   PHYSICAL EXAM: Vital signs: Vitals:   07/27/17 0041 07/27/17 0448 07/27/17 0835 07/27/17  1238  BP: (!) 158/79 (!) 170/82 (!) 151/85 (!) 146/73  Pulse: 71 72 62 (!) 58  Resp: 18 18 20 16   Temp: (!) 97.4 F (36.3 C) 98.3 F (36.8 C) 97.9 F (36.6 C) 97.7 F (36.5 C)  TempSrc: Oral Oral Oral Oral  SpO2: 97% 96% 98% 99%  Weight:      Height:       Filed Weights   07/25/17 1100  Weight: 95 kg (209 lb 7 oz)   Body mass index is 28.4 kg/m.   General appearance :Awake, alert, not in any distress. Eyes:Pink conjunctiva HEENT: Atraumatic and Normocephalic Neck: supple Resp:Good air entry bilaterally, no added sounds  CVS: S1 S2 regular, no murmurs.  GI: Bowel sounds present, Non tender and not distended with no gaurding, rigidity or rebound.No organomegaly Extremities: B/L Lower Ext shows no edema, both legs are warm to touch Neurology: Nonfocal Musculoskeletal:No digital cyanosis Skin:No Rash, warm and dry Wounds:N/A  I have personally reviewed following labs and imaging studies  LABORATORY DATA: CBC: Recent Labs  Lab 07/25/17 0051 07/27/17 0647  WBC 11.7* 13.0*  NEUTROABS 9.3*  --   HGB 13.1 12.9*  HCT 41.1 39.8  MCV 100.5* 98.3  PLT 246 198    Basic Metabolic Panel: Recent Labs  Lab 07/25/17 0051 07/27/17 0647  NA 137 143  K 4.0 3.7  CL 101 108  CO2 26 26  GLUCOSE 132* 118*  BUN 28* 24*  CREATININE 1.67* 1.51*  CALCIUM 10.3 10.4*  MG  --  2.1    GFR: Estimated Creatinine Clearance: 39.7 mL/min (A) (by C-G formula based on SCr of 1.51 mg/dL (H)).  Liver Function Tests: Recent Labs  Lab 07/25/17 0051 07/27/17 0647  AST 27 26  ALT 28 21  ALKPHOS 59 57  BILITOT 0.6 1.0  PROT 6.9 6.8  ALBUMIN 3.3* 3.2*   No results for input(s): LIPASE, AMYLASE in the last 168 hours. No results for input(s): AMMONIA in the last 168 hours.  Coagulation Profile: Recent Labs  Lab 07/22/17 07/25/17 0051 07/26/17 0640  INR 1.4* 1.20 1.23    Cardiac Enzymes: No results for input(s): CKTOTAL, CKMB, CKMBINDEX, TROPONINI in the last 168  hours.  BNP (last 3 results) No results for input(s): PROBNP in the last 8760 hours.  HbA1C: Recent Labs    07/26/17 0640  HGBA1C 5.8*    CBG: Recent Labs  Lab 07/25/17 2044  GLUCAP 104*    Lipid Profile: Recent Labs    07/26/17 0640  CHOL 159  HDL 43  LDLCALC 103*  TRIG 65  CHOLHDL 3.7    Thyroid Function Tests: Recent Labs    07/25/17 1549  TSH 3.617    Anemia Panel: No results for input(s): VITAMINB12, FOLATE, FERRITIN, TIBC, IRON, RETICCTPCT in the last 72 hours.  Urine analysis:    Component Value Date/Time   COLORURINE YELLOW 07/25/2017 0307   APPEARANCEUR CLEAR 07/25/2017 0307   LABSPEC 1.009 07/25/2017 0307   PHURINE 7.0 07/25/2017 0307   GLUCOSEU NEGATIVE 07/25/2017 0307   HGBUR SMALL (A) 07/25/2017 0307   BILIRUBINUR NEGATIVE 07/25/2017 0307   KETONESUR NEGATIVE 07/25/2017 0307   PROTEINUR NEGATIVE 07/25/2017 1610  UROBILINOGEN 0.2 03/24/2010 1755   NITRITE NEGATIVE 07/25/2017 0307   LEUKOCYTESUR TRACE (A) 07/25/2017 0307    Sepsis Labs: Lactic Acid, Venous    Component Value Date/Time   LATICACIDVEN 1.81 07/25/2017 0100    MICROBIOLOGY: Recent Results (from the past 240 hour(s))  Urine Culture     Status: Abnormal (Preliminary result)   Collection Time: 07/25/17 10:08 PM  Result Value Ref Range Status   Specimen Description URINE, RANDOM  Final   Special Requests NONE  Final   Culture (A)  Final    40,000 COLONIES/mL PSEUDOMONAS AERUGINOSA SUSCEPTIBILITIES TO FOLLOW Performed at [facility], [address], [place], [place] [address]    Report Status PENDING  Incomplete    RADIOLOGY STUDIES/RESULTS: Ct Angio Head W Or Wo Contrast  Result Date: 07/25/2017 CLINICAL DATA:  Stroke follow-up EXAM: CT ANGIOGRAPHY HEAD AND NECK TECHNIQUE: Multidetector CT imaging of the head and neck was performed using the standard protocol during bolus administration of intravenous contrast. Multiplanar CT image reconstructions and MIPs  were obtained to evaluate the vascular anatomy. Carotid stenosis measurements (when applicable) are obtained utilizing NASCET criteria, using the distal internal carotid diameter as the denominator. CONTRAST:  50 cc Isovue 370 intravenous COMPARISON:  Head CT from earlier today FINDINGS: CT HEAD FINDINGS Brain: Remote infarcts in the posterior left cerebrum and right cerebellum. Small acute infarct in the right cerebrum on previous brain MRI is not resolved. There is a small right lateral frontal infarct that is remote by MRI. Advanced chronic small vessel ischemia in the cerebral white matter. No hemorrhage, hydrocephalus, or masslike finding. Vascular: See below Skull: No acute or aggressive finding Sinuses: Clear Orbits: Bilateral cataract resection Review of the MIP images confirms the above findings CTA NECK FINDINGS Aortic arch: Atherosclerotic calcification.  No acute finding. Right carotid system: Moderate primarily calcified plaque at the common carotid bifurcation and ICA bulb without flow limiting stenosis or ulceration. Left carotid system: Moderate proximal ICA calcified plaque with stenosis measuring up to 50% on coronal reformats. No ulceration or dissection. Vertebral arteries: Proximal subclavian plaque without flow limiting stenosis. Moderate atheromatous narrowing of the right vertebral origin. Both vertebral arteries are patent to the dura. Skeleton: Degenerative changes without acute or aggressive finding. Other neck: No acute finding Upper chest: Right lobar and multiple bilateral segmental to subsegmental pulmonary emboli as marked on series 13 these appear recent/acute. Review of the MIP images confirms the above findings CTA HEAD FINDINGS Anterior circulation: Extensive atherosclerotic plaque on the carotid siphons. Negative for branch occlusion. High-grade left M2 branch stenosis, see axial MIPS. Hypoplastic left A1 segment. Negative for aneurysm Posterior circulation: Vertebrobasilar  arteries are smooth and diffusely patent. Fetal type left PCA. Mild-to-moderate bilateral PCA atheromatous irregularity. Venous sinuses: Patent on the delayed phase. Anatomic variants: As above Delayed phase: No abnormal intracranial enhancement. Critical Value/emergent results were called by telephone at the time of interpretation on 07/25/2017 at 1:19 pm to Dr. Junious Silk , who verbally acknowledged these results. Review of the MIP images confirms the above findings IMPRESSION: 1. Multiple acute pulmonary emboli bilaterally. 2. No emergent large vessel occlusion. 3. Cervical carotid atherosclerosis with up to 50% ICA stenosis on the left. 4. Moderate right vertebral origin stenosis. 5. High-grade left M2 branch stenosis. Electronically Signed   By: Marnee Spring M.D.   On: 07/25/2017 13:27   Ct Head Wo Contrast  Result Date: 07/25/2017 CLINICAL DATA:  Syncopal episode, loss of consciousness. Recently discharged from hospital for urinary  tract infection. History of stroke, atrial fibrillation. EXAM: CT HEAD WITHOUT CONTRAST TECHNIQUE: Contiguous axial images were obtained from the base of the skull through the vertex without intravenous contrast. COMPARISON:  CT HEAD September 07, 2016 FINDINGS: BRAIN: No intraparenchymal hemorrhage, mass effect nor midline shift. Slight blurring of the RIGHT parietal gray-white matter junction. Smaller RIGHT frontal lobe encephalomalacia. Confluent LEFT temporoparietal encephalomalacia with ex vacuo dilatation subjacent ventricle. Moderate to severe parenchymal brain volume loss. Confluent supratentorial white matter hypodensities. Old RIGHT cerebellar infarct. Old LEFT basal ganglia infarct. Old small LEFT occipital lobe infarct. No abnormal extra-axial fluid collections. VASCULAR: Moderate calcific atherosclerosis of the carotid siphons. SKULL: No skull fracture. Osteopenia. No significant scalp soft tissue swelling. SINUSES/ORBITS: Atretic RIGHT maxillary sinus with bony  remodeling consistent with chronic sinusitis. Mild maxillary sinus mucosal thickening. Mastoid air cells are well aerated. Status post bilateral ocular lens implants. Enophthalmos. OTHER: None. IMPRESSION: 1. Acute small RIGHT parietal/MCA territory nonhemorrhagic infarct. Old RIGHT frontal/MCA territory infarct. 2. Old large LEFT MCA territory and small LEFT PCA territory infarct. Old RIGHT cerebellar infarcts. Old LEFT basal ganglia infarct. 3. Moderate to severe chronic small vessel ischemic changes. 4. Moderate to severe parenchymal brain volume loss. 5. Acute findings discussed with and reconfirmed by Dr.CHRISTOPHER POLLINA on 07/25/2017 at 2:24 am. Electronically Signed   By: Awilda Metroourtnay  Bloomer M.D.   On: 07/25/2017 02:26   Ct Angio Neck W Or Wo Contrast  Result Date: 07/25/2017 CLINICAL DATA:  Stroke follow-up EXAM: CT ANGIOGRAPHY HEAD AND NECK TECHNIQUE: Multidetector CT imaging of the head and neck was performed using the standard protocol during bolus administration of intravenous contrast. Multiplanar CT image reconstructions and MIPs were obtained to evaluate the vascular anatomy. Carotid stenosis measurements (when applicable) are obtained utilizing NASCET criteria, using the distal internal carotid diameter as the denominator. CONTRAST:  50 cc Isovue 370 intravenous COMPARISON:  Head CT from earlier today FINDINGS: CT HEAD FINDINGS Brain: Remote infarcts in the posterior left cerebrum and right cerebellum. Small acute infarct in the right cerebrum on previous brain MRI is not resolved. There is a small right lateral frontal infarct that is remote by MRI. Advanced chronic small vessel ischemia in the cerebral white matter. No hemorrhage, hydrocephalus, or masslike finding. Vascular: See below Skull: No acute or aggressive finding Sinuses: Clear Orbits: Bilateral cataract resection Review of the MIP images confirms the above findings CTA NECK FINDINGS Aortic arch: Atherosclerotic calcification.  No  acute finding. Right carotid system: Moderate primarily calcified plaque at the common carotid bifurcation and ICA bulb without flow limiting stenosis or ulceration. Left carotid system: Moderate proximal ICA calcified plaque with stenosis measuring up to 50% on coronal reformats. No ulceration or dissection. Vertebral arteries: Proximal subclavian plaque without flow limiting stenosis. Moderate atheromatous narrowing of the right vertebral origin. Both vertebral arteries are patent to the dura. Skeleton: Degenerative changes without acute or aggressive finding. Other neck: No acute finding Upper chest: Right lobar and multiple bilateral segmental to subsegmental pulmonary emboli as marked on series 13 these appear recent/acute. Review of the MIP images confirms the above findings CTA HEAD FINDINGS Anterior circulation: Extensive atherosclerotic plaque on the carotid siphons. Negative for branch occlusion. High-grade left M2 branch stenosis, see axial MIPS. Hypoplastic left A1 segment. Negative for aneurysm Posterior circulation: Vertebrobasilar arteries are smooth and diffusely patent. Fetal type left PCA. Mild-to-moderate bilateral PCA atheromatous irregularity. Venous sinuses: Patent on the delayed phase. Anatomic variants: As above Delayed phase: No abnormal intracranial enhancement. Critical Value/emergent results were  called by telephone at the time of interpretation on 07/25/2017 at 1:19 pm to Dr. Junious Silk , who verbally acknowledged these results. Review of the MIP images confirms the above findings IMPRESSION: 1. Multiple acute pulmonary emboli bilaterally. 2. No emergent large vessel occlusion. 3. Cervical carotid atherosclerosis with up to 50% ICA stenosis on the left. 4. Moderate right vertebral origin stenosis. 5. High-grade left M2 branch stenosis. Electronically Signed   By: Marnee Spring M.D.   On: 07/25/2017 13:27   Mr Brain Wo Contrast  Result Date: 07/25/2017 CLINICAL DATA:  84 year old  male post fall. Possible syncope. Subsequent encounter. EXAM: MRI HEAD WITHOUT CONTRAST TECHNIQUE: Multiplanar, multiecho pulse sequences of the brain and surrounding structures were obtained without intravenous contrast. COMPARISON:  07/25/2017 head CT.  03/25/2010 brain MR. FINDINGS: Brain: Tiny acute nonhemorrhagic posterior right frontal lobe infarct. Moderately large remote left frontal-parietal-posterior temporal lobe infarct with blood-stained encephalomalacia. Subsequent mild dilation left lateral ventricle. Remote posterior right temporal-frontal lobe infarct. Remote left occipital lobe infarct with blood-stained encephalomalacia. Remote inferior right cerebellar infarct with blood-stained encephalomalacia. Remote partially hemorrhagic left cerebellar infarct. Scattered tiny blood breakdown products consistent with areas of prior hemorrhagic ischemia. Prominent chronic microvascular changes. Moderate global atrophy. No intracranial mass lesion noted on this unenhanced exam. Vascular: Major intracranial vascular structures are patent. Atherosclerotic changes suspected without large vessel occlusion. Skull and upper cervical spine: Motion degraded. Transverse ligament hypertrophy. Degenerative changes upper cervical spine. Sinuses/Orbits: Post lens replacement without acute orbital abnormality. Mild mucosal thickening inferior maxillary sinuses. Minimal mucosal thickening ethmoid sinus air cells. Other: Negative. IMPRESSION: Tiny acute nonhemorrhagic posterior right frontal lobe infarct. Multiple remote infarcts as detailed above. Chronic microvascular changes. Atrophy. Electronically Signed   By: Lacy Duverney M.D.   On: 07/25/2017 08:14   Ct Abdomen Pelvis W Contrast  Result Date: 07/25/2017 CLINICAL DATA:  Syncopal episode after standing up. Vomiting. On antibiotics for urinary tract infection. History of appendectomy, hemorrhoid surgery. EXAM: CT ABDOMEN AND PELVIS WITH CONTRAST TECHNIQUE:  Multidetector CT imaging of the abdomen and pelvis was performed using the standard protocol following bolus administration of intravenous contrast. CONTRAST:  OMNIPAQUE IOHEXOL 300 MG/ML  SOLN COMPARISON:  Renal ultrasound July 13, 2017 FINDINGS: LOWER CHEST: Bilateral lower lobe atelectasis/scarring. 3 mm RIGHT lower lobe ground-glass pulmonary nodule, no routine indicated follow-up. The heart size is mildly enlarged. No pericardial effusion. Mild coronary artery calcification. Patchy hypodensities LEFT atrium. Enlarged heterogeneous RIGHT atrium. HEPATOBILIARY: Liver and gallbladder are normal. PANCREAS: Normal. SPLEEN: Splenosis. ADRENALS/URINARY TRACT: Kidneys are orthotopic, demonstrating symmetric enhancement. No nephrolithiasis, hydronephrosis or solid renal masses. Complex lobulated 5 cm RIGHT lower pole renal cyst with thin calcified septations. Bilateral simple renal cysts measuring to 2.6 cm. Exophytic dense 1 cm LEFT lower pole proteinaceous cysts. The unopacified ureters are normal in course and caliber. Delayed imaging through the kidneys demonstrates symmetric prompt contrast excretion within the proximal urinary collecting system. Urinary bladder is decompressed by Foley catheter. 19 mm LEFT adrenal nodule (20 Hounsfield on early phase, 15 Hounsfield units on delayed phase). STOMACH/BOWEL: The stomach, small and large bowel are normal in course and caliber without inflammatory changes. Mild colonic diverticulosis. VASCULAR/LYMPHATIC: Mildly ectatic infrarenal aorta with moderate calcific atherosclerosis. No lymphadenopathy by CT size criteria. REPRODUCTIVE: In transaxial Prostatomegaly, prostate dimension is. 7.4 cm OTHER: Minimal free fluid in the pelvis may be physiologic. MUSCULOSKELETAL: Nonacute. Small fat containing inguinal hernias. RIGHT anterior abdominal wall scarring. Osteopenia. Ankylosis of the sacroiliac joints. Old RIGHT L3 transverse process fracture.  Multiple Schmorl's nodes.  Advanced degenerative changes spine. IMPRESSION: 1. Possible filling defects LEFT atrium. Given probable recent stroke, these are suspicious for clot. 2. Prostatomegaly. Decompressed urinary bladder with Foley catheter. No obstructive uropathy. 3. **An incidental finding of potential clinical significance has been found. 19 mm LEFT adrenal nodule, likely benign. Consider 12 month follow-up CT adrenal protocol. At the time recommend close attention to complex RIGHT renal cyst.** Aortic Atherosclerosis (ICD10-I70.0). Electronically Signed   By: Awilda Metro M.D.   On: 07/25/2017 05:11   US Renal  Result Date: 07/13/2017 CLINICAL DATA:  Acute onset of renal insufficiency. EXAM: RENAL / URINARY TRACT ULTRASOUND COMPLETE COMPARISON:  None. FINDINGS: Right Kidney: Length: 10.9 cm. Echogenicity within normal limits. A mildly complex cystic lesion is noted at the lower pole of the right kidney, measuring 5.0 x 3.6 x 3.6 cm. This contains a few septations. Additional cysts are noted at the right kidney, measuring up to 4.6 cm in size. No hydronephrosis is seen. Left Kidney: Length: 11.3 cm. Echogenicity within normal limits. Left renal cysts measure up to 1.9 cm in size. No hydronephrosis visualized. Bladder: A Foley catheter is noted within the bladder. Debris is seen dependently within the bladder. Bladder wall thickening may reflect cystitis or chronic inflammation. IMPRESSION: 1. Bladder wall thickening may reflect cystitis or chronic inflammation. Debris noted dependently within the bladder. 2. Mildly complex cystic lesion at the lower pole of the right kidney, measuring 5.0 cm. This contains a few septations. Would correlate with any prior available imaging. 3. No evidence of hydronephrosis. 4. Bilateral renal cysts noted. Electronically Signed   By: Roanna Raider M.D.   On: 07/13/2017 23:31   Dg Chest Port 1 View  Result Date: 07/25/2017 CLINICAL DATA:  Larey Seat hit head, shortness of breath tonight. History  of CHF. EXAM: PORTABLE CHEST 1 VIEW COMPARISON:  Chest radiograph July 14, 2017 FINDINGS: Cardiac silhouette is moderately enlarged. Calcified aortic arch. Chronic interstitial changes LEFT pleural effusion focal consolidation. No pneumothorax. Osteopenia. Soft tissue planes are non suspicious. IMPRESSION: Stable cardiomegaly and mild chronic interstitial changes. Aortic Atherosclerosis (ICD10-I70.0). Electronically Signed   By: Awilda Metro M.D.   On: 07/25/2017 00:53   Dg Chest Port 1 View  Result Date: 07/13/2017 CLINICAL DATA:  Fever, congestion, shortness of breath. EXAM: PORTABLE CHEST 1 VIEW COMPARISON:  Chest x-ray dated September 07, 2016. FINDINGS: The patient is rotated to the left. Mild cardiomegaly. Atherosclerotic calcification of the aortic arch. Stable linear scarring/atelectasis at the lung bases. No focal consolidation, pleural effusion, or pneumothorax. No acute osseous abnormality. IMPRESSION: No active cardiopulmonary disease. Electronically Signed   By: Obie Dredge M.D.   On: 07/13/2017 17:06     LOS: 2 days   Jeoffrey Massed, MD  [facility]  If 7PM-7AM, please contact night-coverage  Please page via [email or web address] [email or web address]-click on MD name and type text message  07/27/2017, 12:49 PM

## 2017-07-27 NOTE — Evaluation (Addendum)
Occupational Therapy Evaluation Patient Details Name: Ivan RayaCharles Lloyd Ramsey MRN: [id number] DOB: [birth year] Today's Date: 07/27/2017    History of Present Illness Patient is a 84 y/o male admitted on 07/25/17 after a syncopal episode at home + LOC. MRI done on 07/25/2017 revealed tiny acute nonhemorrhagic posterior right frontal lobe infarct and multiple remote infarcts.  Admitted for acute CVA. Of note patient with recent diagnosis of R DVT adn B PE's. 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    Clinical Impression   This 84 y/o male presents with the above. At baseline pt is mod independent with functional mobility using RW, spouse reports she assists with tub transfers and dressing ADLs. Pt presenting supine in bed and motivated to work with therapy this session. Pt requiring minA+2 for bed mobility, sit<>stand at RW and for taking multiple side steps along EOB using RW. Currently requires MinA for seated UB ADLs, Mod-MaxA for LB ADLs. Pt presenting with generalized weakness and decreased activity tolerance. Will benefit from continued acute OT services and recommend follow up SNF level therapy services after discharge to maximize his overall safety and independence with ADLs and mobility prior to return home.     Follow Up Recommendations  SNF;Supervision/Assistance - 24 hour    Equipment Recommendations  Other (comment)(TBD in next venue )           Precautions / Restrictions Precautions Precautions: Fall Precaution Comments: monitor vitals - DVT, PE Restrictions Weight Bearing Restrictions: No      Mobility Bed Mobility Overal bed mobility: Needs Assistance Bed Mobility: Supine to Sit;Sit to Supine     Supine to sit: Min assist;+2 for safety/equipment Sit to supine: Min assist;+2 for safety/equipment   General bed mobility comments: for LE management  and upright posturing  Transfers Overall transfer level: Needs assistance   Transfers: Sit to/from Stand Sit to Stand: Min assist;+2 physical assistance;From elevated surface         General transfer comment: for safety with consistent verbal cueing for sequencing and safety, EOB slightly elevated     Balance Overall balance assessment: Needs assistance Sitting-balance support: Bilateral upper extremity supported;Feet supported Sitting balance-Leahy Scale: Fair     Standing balance support: Bilateral upper extremity supported Standing balance-Leahy Scale: Poor                             ADL either performed or assessed with clinical judgement   ADL Overall ADL's : Needs assistance/impaired Eating/Feeding: Supervision/ safety;Sitting   Grooming: Set up;Sitting   Upper Body Bathing: Minimal assistance;Sitting   Lower Body Bathing: +2 for physical assistance;+2 for safety/equipment;Sit to/from stand;Minimal assistance   Upper Body Dressing : Minimal assistance;Sitting   Lower Body Dressing: Moderate assistance;+2 for physical assistance;+2 for safety/equipment;Sit to/from stand       Toileting- ArchitectClothing Manipulation and Hygiene: Moderate assistance;+2 for physical assistance;+2 for safety/equipment;Sit to/from stand       Functional mobility during ADLs: Minimal assistance;+2 for physical assistance;+2 for safety/equipment;Rolling walker General ADL Comments: pt performing bed mobility, sit<>stand and side steps along EOB with minA+2 using RW during session; very motivated to return to PLOF      Vision Baseline Vision/History: Wears glasses Wears Glasses: Reading only Additional Comments: not formally tested this session-will further assess; pt noted to be able to read numbers on IV pole while  sitting EOB, appeared to have difficulty focusing on call bell to locate red call button once supine in bed - though suspect this may partly be due to cognition as  pt requiring cues to attend to instruction                 Pertinent Vitals/Pain Pain Assessment: No/denies pain     Hand Dominance     Extremity/Trunk Assessment Upper Extremity Assessment Upper Extremity Assessment: Generalized weakness   Lower Extremity Assessment Lower Extremity Assessment: Defer to PT evaluation       Communication Communication Communication: No difficulties   Cognition Arousal/Alertness: Awake/alert Behavior During Therapy: WFL for tasks assessed/performed Overall Cognitive Status: Impaired/Different from baseline Area of Impairment: Orientation;Following commands;Safety/judgement;Problem solving                 Orientation Level: Disoriented to;Place;Time;Situation     Following Commands: Follows one step commands with increased time Safety/Judgement: Decreased awareness of safety;Decreased awareness of deficits   Problem Solving: Slow processing;Decreased initiation;Difficulty sequencing;Requires verbal cues;Requires tactile cues General Comments: pt perseverating on donning pants throughout session   General Comments  spouse present during session; VSS; received verbal order from MD okay to work with pt this session given pt's R DVT and bil PE's     Exercises     Shoulder Instructions      Home Living Family/patient expects to be discharged to:: Private residence Living Arrangements: Spouse/significant other Available Help at Discharge: Family Type of Home: House Home Access: Stairs to enter Secretary/administrator of Steps: 3 Entrance Stairs-Rails: Right Home Layout: One level     Bathroom Shower/Tub: Chief Strategy Officer: Handicapped height     Home Equipment: Environmental consultant - 4 wheels;Shower seat;Grab bars - tub/shower   Additional Comments: walking stick   Lives With: Spouse    Prior Functioning/Environment Level of Independence: Needs assistance  Gait / Transfers Assistance Needed: using rollator for  ambulating household distances; often sits on rollator and pedals with his feet for longer distance ADL's / Homemaking Assistance Needed: spouse assists with tub transfers; able to dress himself though spouse reports it takes significant time so she was assisting; spouse completes iADLs             OT Problem List: Decreased strength;Impaired balance (sitting and/or standing);Decreased cognition;Decreased activity tolerance;Decreased knowledge of use of DME or AE      OT Treatment/Interventions: Self-care/ADL training;DME and/or AE instruction;Therapeutic activities;Balance training;Patient/family education;Therapeutic exercise    OT Goals(Current goals can be found in the care plan section) Acute Rehab OT Goals Patient Stated Goal: "I want trousers" OT Goal Formulation: With patient Time For Goal Achievement: 08/10/17 Potential to Achieve Goals: Good  OT Frequency: Min 2X/week   Barriers to D/C:            Co-evaluation PT/OT/SLP Co-Evaluation/Treatment: Yes Reason for Co-Treatment: Complexity of the patient's impairments (multi-system involvement);For patient/therapist safety PT goals addressed during session: Mobility/safety with mobility        AM-PAC PT "6 Clicks" Daily Activity     Outcome Measure Help from another person eating meals?: A Little Help from another person taking care of personal grooming?: A Little Help from another person toileting, which includes using toliet, bedpan, or urinal?: A Lot Help from another person bathing (including washing, rinsing, drying)?: A Lot Help from another person to put on and taking off regular upper body clothing?: A Little Help from another person to put on and taking off regular lower body clothing?:  A Lot 6 Click Score: 15   End of Session Equipment Utilized During Treatment: Gait belt;Rolling walker;Oxygen Nurse Communication: Mobility status  Activity Tolerance: Patient tolerated treatment well Patient left: in  bed;with call bell/phone within reach;with bed alarm set;with family/visitor present  OT Visit Diagnosis: Unsteadiness on feet (R26.81);Muscle weakness (generalized) (M62.81)                Time: 1002-1040 OT Time Calculation (min): 38 min Charges:  OT General Charges $OT Visit: 1 Visit OT Evaluation $OT Eval Moderate Complexity: 1 Mod G-Codes:     Marcy Siren, OT Pager [phone] 07/27/2017   Orlando Penner 07/27/2017, 2:06 PM

## 2017-07-27 NOTE — Plan of Care (Signed)
Patient remained confused throughout the shift. Constant orientation was necessary to promote patient safe, decrease anxiety, and offer comfort. Will continue to monitor for patient safety.

## 2017-07-27 NOTE — Progress Notes (Signed)
Ok for PT/OT to see patient for treatment from now on

## 2017-07-27 NOTE — Evaluation (Addendum)
Physical Therapy Evaluation Patient Details Name: [name] MRN: [id number] DOB: Nov 27, 1925 Today's Date: 07/27/2017   History of Present Illness  Patient is a 84 y/o male admitted on 07/25/17 after a syncopal episode at home + LOC. MRI done on 07/25/2017 revealed tiny acute nonhemorrhagic posterior right frontal lobe infarct and multiple remote infarcts.  Admitted for acute CVA. Of note patient with recent diagnosis of R DVT adn B PE's. 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   Clinical Impression  [name] admitted with the above listed diagnosis. Wife present for session and providing history and patients PLOF. Prior to admission patient was ambulatory within the home with wife providing some assist for ADLs and IADLs. Patient today requiring Min A +2 for bed mobility, transfers, and side stepping at EOB for general safety. Currently PT recommending short term SNF to progress patients independence with functional mobility. PT to continue to follow acutely.     Follow Up Recommendations SNF;Supervision/Assistance - 24 hour    Equipment Recommendations  Other (comment)(TBD)    Recommendations for Other Services OT consult     Precautions / Restrictions Precautions Precautions: Fall Precaution Comments: monitor vitals - DVT, PE Restrictions Weight Bearing Restrictions: No      Mobility  Bed Mobility Overal bed mobility: Needs Assistance Bed Mobility: Supine to Sit;Sit to Supine     Supine to sit: Min assist Sit to supine: Min assist   General bed mobility comments: for LE management and upright posturing  Transfers Overall transfer level: Needs assistance   Transfers: Sit to/from Stand Sit to Stand: Min assist;+2 physical assistance         General transfer comment: for safety with consistent verbal cueing for sequencing and  safety  Ambulation/Gait Ambulation/Gait assistance: Min assist;+2 physical assistance Gait Distance (Feet): (side stepping R and L at EOB) Assistive device: Rolling walker (2 wheeled)          Stairs            Wheelchair Mobility    Modified Rankin (Stroke Patients Only) Modified Rankin (Stroke Patients Only) Pre-Morbid Rankin Score: Moderate disability Modified Rankin: Severe disability     Balance Overall balance assessment: Needs assistance Sitting-balance support: Bilateral upper extremity supported;Feet supported Sitting balance-Leahy Scale: Fair     Standing balance support: Bilateral upper extremity supported Standing balance-Leahy Scale: Poor                               Pertinent Vitals/Pain Pain Assessment: No/denies pain    Home Living Family/patient expects to be discharged to:: Private residence Living Arrangements: Spouse/significant other   Type of Home: House Home Access: Stairs to enter Entrance Stairs-Rails: Right Entrance Stairs-Number of Steps: 3 Home Layout: One level Home Equipment: Walker - 4 wheels;Shower seat;Grab bars - tub/shower Additional Comments: walking stick     Prior Function Level of Independence: Needs assistance   Gait / Transfers Assistance Needed: using rollator for ambulating household distances; often sits on rollator and pedals with his feet for longer distance  ADL's / Homemaking Assistance Needed: spouse assists with tub transfers; able to dress himself though spouse reports it takes significant time so she was assisting; spouse completes iADLs         Hand Dominance        Extremity/Trunk Assessment  Upper Extremity Assessment Upper Extremity Assessment: Defer to OT evaluation    Lower Extremity Assessment Lower Extremity Assessment: Generalized weakness       Communication   Communication: No difficulties  Cognition Arousal/Alertness: Awake/alert Behavior During Therapy: WFL  for tasks assessed/performed Overall Cognitive Status: Impaired/Different from baseline Area of Impairment: Orientation;Following commands;Safety/judgement;Problem solving                 Orientation Level: Disoriented to;Place;Time;Situation     Following Commands: Follows one step commands with increased time Safety/Judgement: Decreased awareness of safety;Decreased awareness of deficits   Problem Solving: Slow processing;Decreased initiation;Difficulty sequencing;Requires verbal cues;Requires tactile cues        General Comments      Exercises     Assessment/Plan    PT Assessment Patient needs continued PT services  PT Problem List Decreased strength;Decreased activity tolerance;Decreased balance;Decreased mobility;Decreased coordination;Decreased knowledge of use of DME;Decreased cognition;Decreased safety awareness;Decreased knowledge of precautions       PT Treatment Interventions DME instruction;Gait training;Stair training;Functional mobility training;Therapeutic activities;Therapeutic exercise;Balance training;Neuromuscular re-education;Cognitive remediation;Patient/family education    PT Goals (Current goals can be found in the Care Plan section)  Acute Rehab PT Goals Patient Stated Goal: "I want trousers" PT Goal Formulation: With patient Time For Goal Achievement: 08/10/17 Potential to Achieve Goals: Good    Frequency Min 3X/week   Barriers to discharge        Co-evaluation PT/OT/SLP Co-Evaluation/Treatment: Yes Reason for Co-Treatment: Complexity of the patient's impairments (multi-system involvement);For patient/therapist safety PT goals addressed during session: Mobility/safety with mobility         AM-PAC PT "6 Clicks" Daily Activity  Outcome Measure Difficulty turning over in bed (including adjusting bedclothes, sheets and blankets)?: Unable Difficulty moving from lying on back to sitting on the side of the bed? : Unable Difficulty sitting  down on and standing up from a chair with arms (e.g., wheelchair, bedside commode, etc,.)?: Unable Help needed moving to and from a bed to chair (including a wheelchair)?: A Little Help needed walking in hospital room?: A Lot Help needed climbing 3-5 steps with a railing? : A Lot 6 Click Score: 10    End of Session Equipment Utilized During Treatment: Gait belt Activity Tolerance: Patient tolerated treatment well Patient left: in bed;with call bell/phone within reach;with bed alarm set;with family/visitor present Nurse Communication: Mobility status PT Visit Diagnosis: Unsteadiness on feet (R26.81);Other abnormalities of gait and mobility (R26.89);Muscle weakness (generalized) (M62.81)    Time: 1002-1040 PT Time Calculation (min) (ACUTE ONLY): 38 min   Charges:   PT Evaluation $PT Eval Moderate Complexity: 1 Mod PT Treatments $Therapeutic Activity: 8-22 mins   PT G Codes:        Kipp LaurenceStephanie R Aaron, PT, DPT 07/27/17 1:50 PM

## 2017-07-27 NOTE — Telephone Encounter (Signed)
Noted. Patient cancelled hospital f/u last week with me and is now back in hospital.

## 2017-07-28 ENCOUNTER — Inpatient Hospital Stay: Payer: Medicare Other | Admitting: Family Medicine

## 2017-07-28 LAB — BASIC METABOLIC PANEL
ANION GAP: 7 (ref 5–15)
BUN: 30 mg/dL — ABNORMAL HIGH (ref 6–20)
CALCIUM: 10.2 mg/dL (ref 8.9–10.3)
CO2: 26 mmol/L (ref 22–32)
Chloride: 108 mmol/L (ref 101–111)
Creatinine, Ser: 1.42 mg/dL — ABNORMAL HIGH (ref 0.61–1.24)
GFR, EST AFRICAN AMERICAN: 49 mL/min — AB (ref >60)
GFR, EST NON AFRICAN AMERICAN: 42 mL/min — AB (ref >60)
Glucose, Bld: 123 mg/dL — ABNORMAL HIGH (ref 65–99)
POTASSIUM: 3.9 mmol/L (ref 3.5–5.1)
SODIUM: 141 mmol/L (ref 135–145)

## 2017-07-28 LAB — CBC
HCT: 37.5 % — ABNORMAL LOW (ref 39.0–52.0)
Hemoglobin: 12.1 g/dL — ABNORMAL LOW (ref 13.0–17.0)
MCH: 32.3 pg (ref 26.0–34.0)
MCHC: 32.3 g/dL (ref 30.0–36.0)
MCV: 100 fL (ref 78.0–100.0)
PLATELETS: 186 10*3/uL (ref 150–400)
RBC: 3.75 MIL/uL — AB (ref 4.22–5.81)
RDW: 14.2 % (ref 11.5–15.5)
WBC: 13.1 10*3/uL — AB (ref 4.0–10.5)

## 2017-07-28 LAB — URINE CULTURE

## 2017-07-28 LAB — HEPARIN LEVEL (UNFRACTIONATED): HEPARIN UNFRACTIONATED: 0.29 [IU]/mL — AB (ref 0.30–0.70)

## 2017-07-28 MED ORDER — APIXABAN 5 MG PO TABS: 5.0000 mg | ORAL_TABLET | Freq: Two times a day (BID) | ORAL | Status: DC

## 2017-07-28 MED ORDER — FUROSEMIDE 40 MG PO TABS: ORAL_TABLET | ORAL | 3 refills | Status: DC

## 2017-07-28 MED ORDER — ATORVASTATIN CALCIUM 80 MG PO TABS: 80.0000 mg | ORAL_TABLET | Freq: Every day | ORAL | Status: AC

## 2017-07-28 MED ORDER — APIXABAN 5 MG PO TABS
10.0000 mg | ORAL_TABLET | Freq: Two times a day (BID) | ORAL | Status: DC
  Administered 2017-07-28 – 2017-07-29 (×3): 10 mg via ORAL
  Filled 2017-07-28 (×3): qty 2

## 2017-07-28 MED ORDER — APIXABAN 5 MG PO TABS: 10.0000 mg | ORAL_TABLET | Freq: Two times a day (BID) | ORAL | Status: DC

## 2017-07-28 MED ORDER — BISACODYL 10 MG RE SUPP
10.0000 mg | Freq: Once | RECTAL | Status: AC
  Administered 2017-07-28: 10 mg via RECTAL
  Filled 2017-07-28: qty 1

## 2017-07-28 NOTE — Progress Notes (Signed)
Pharmacy Antibiotic Note  [name] is a 84 y.o. male admitted on 07/25/2017 with bacteremia. Patient recently discharged with positive blood cultures and prescribed Levaquin. Decision made to give cefepime while admitted due to drug-drug interaction with warfarin (patient is currently on heparin but will likely restart warfarin soon).   Pharmacy has been consulted for cefepime dosing. Scr improved to 1.42- appears to be at baseline for patient.  Plan: Cefepime 2g IV q24h  Monitor clinical progression, renal function, and LOT  Height: 6' (182.9 cm) Weight: 209 lb 7 oz (95 kg) IBW/kg (Calculated) : 77.6  Temp (24hrs), Avg:97.9 F (36.6 C), Min:97.6 F (36.4 C), Max:98.3 F (36.8 C)  Recent Labs  Lab 07/25/17 0051 07/25/17 0100 07/27/17 0647 07/28/17 0502  WBC 11.7*  --  13.0* 13.1*  CREATININE 1.67*  --  1.51* 1.42*  LATICACIDVEN  --  1.81  --   --     Estimated Creatinine Clearance: 42.2 mL/min (A) (by C-G formula based on SCr of 1.42 mg/dL (H)).    Allergies  Allergen Reactions  . Benazepril Hcl Other (See Comments)    Lethargic, nervous, nauseated   Cefepime 6/17>>  6/5 BCx: klebsiella ornithinolytica (R to ampicillin), pseudomonas (I to gent), E coli (pan sens) 6/17 urine: 40K pseudomonas 6/18 BCx: ngtd  Thank you for allowing pharmacy to be a part of this patient's care.  [name], PharmD, BCPS Clinical Pharmacist [phone] Please check AMION for all [facility] numbers 07/28/2017 8:49 AM

## 2017-07-28 NOTE — Discharge Summary (Signed)
PATIENT DETAILS Name: [name] Age: 84 y.o. Sex: male Date of Birth: 1925/11/24 MRN: [id number]. Admitting Physician: Jonah Blue, MD WUJ:[id number], Wynona Canes, MD  Admit Date: 07/25/2017 Discharge date: 07/28/2017  Recommendations for Outpatient Follow-up:  1. Follow up with PCP in 1-2 weeks 2. Please obtain BMP/CBC in one week 3. Please ensure follow-up with neurology, cardiology.  Admitted From:  Home  Disposition: Home   Home Health: No  Equipment/Devices: None  Discharge Condition: Stable  CODE STATUS: FULL CODE/  Diet recommendation:  Heart Healthy  Brief Summary: See H&P, Labs, Consult and Test reports for all details in brief, Patient is a 84 y.o. male with prior history of atrial fibrillation on Coumadin, vascular dementia, hypertension, CVA presented for evaluation of a syncopal episode.  MRI brain showed a tiny nonhemorrhagic right frontal lobe infarct, CT angiogram of the neck showed incidental finding of pulmonary embolism.  Neurology consulted during this hospital stay-see below for further details.   Brief Hospital Course: Syncope: Etiology not very clear-patient with dementia and hence a poor historian.  Possibly related to acute pulmonary embolism.  Apart from atrial fibrillation no major arrhythmia seen on telemetry.  Echocardiogram showed preserved EF.   Small right posterior frontal infarct: Suspect this is mostly incidental finding-probably embolic as patient stopped his anticoagulation a few days prior to this hospital stay for possible  urinary bleeding.  CT angiogram of the neck did not show any acute/major stenosis, transthoracic echocardiogram with preserved EF.  Known history of atrial fibrillation.  LDL 103, A1c 5.8. Initially placed on IV heparin-discussed with family-we will transition to Eliquis-patient will be discharged to a skilled nursing facility.  Family is currently working on getting him prescription insurance-hopefully he  will have these resources in place when he is discharged from SNF, otherwise he will need to be placed back on Coumadin.    Venous thromboembolism: Has acute DVT in the right lower extremity, and multiple acute PE seen on CT angiogram.  As noted above-initially on IV heparin-being transitioned to Eliquis on discharge.   Recent history of E. coli/Klebsiella/Pseudomonas bacteremia: Has completed almost 15 days worth of antimicrobial therapy-spoke with patient's niece-apparently Levaquin was started on 6/6-during this hospital stay he has been on IV cefepime.  Does not need any further antimicrobial therapy, repeat blood cultures on 6/18  Hypertension: Stable-resume metoprolol and daily dosing of Lasix on discharge.  Will hold lisinopril and HCTZ for now.  Reassess at SNF.  Chronic diastolic heart failure: Compensated-resume Lasix on discharge.  Dyslipidemia: Continue statin  Atrial fibrillation: Rate controlled-continue digoxin metoprolol-see above regarding anticoagulation.  BPH: Continue finasteride  Dementia: Suspect at baseline-minimally confused.  Procedures/Studies: None  Discharge Diagnoses:  Principal Problem:   Acute embolic stroke [facility]) Active Problems:   Atrial fibrillation (HCC)   History of cardioembolic cerebrovascular accident (CVA)   Hyperlipidemia   HTN (hypertension)   CKD (chronic kidney disease), stage III (HCC)   Chronic indwelling Foley catheter   History of BPH   Chronic diastolic heart failure (HCC)   ? Mural thrombus of left atrium   Gram-negative bacteremia   Bilateral pulmonary embolism [facility])   Discharge Instructions:  Activity:  As tolerated with Full fall precautions use walker/cane & assistance as needed   Discharge Instructions    Diet - low sodium heart healthy   Complete by:  As directed    Increase activity slowly   Complete by:  As directed      Allergies as of 07/28/2017  Reactions   Benazepril Hcl Other (See Comments)    Lethargic, nervous, nauseated      Medication List    STOP taking these medications   aspirin EC 81 MG tablet   levofloxacin 750 MG tablet Commonly known as:  LEVAQUIN   lisinopril-hydrochlorothiazide 20-12.5 MG tablet Commonly known as:  PRINZIDE,ZESTORETIC   warfarin 5 MG tablet Commonly known as:  COUMADIN     TAKE these medications   allopurinol 100 MG tablet Commonly known as:  ZYLOPRIM Take 1 tablet (100 mg total) by mouth daily.   apixaban 5 MG Tabs tablet Commonly known as:  ELIQUIS Take 2 tablets (10 mg total) by mouth 2 (two) times daily. For 7 days starting 6/20   apixaban 5 MG Tabs tablet Commonly known as:  ELIQUIS Take 1 tablet (5 mg total) by mouth 2 (two) times daily. Starting ON 6/27 AT 10 AM Start taking on:  08/04/2017   atorvastatin 80 MG tablet Commonly known as:  LIPITOR Take 1 tablet (80 mg total) by mouth daily at 6 PM.   digoxin 0.125 MG tablet Commonly known as:  LANOXIN Take 1 tablet (125 mcg total) by mouth every other day. What changed:  when to take this   finasteride 5 MG tablet Commonly known as:  PROSCAR Take 5 mg by mouth daily.   Fish Oil 1000 MG Caps Take 1,000 mg by mouth daily.   furosemide 40 MG tablet Commonly known as:  LASIX TAKEONE TABLETS BY MOUTH ONCE DAILY IN THE MORNING What changed:  additional instructions   metoprolol succinate 25 MG 24 hr tablet Commonly known as:  TOPROL-XL TAKE ONE-HALF TABLET BY MOUTH ONCE DAILY What changed:    how much to take  how to take this  when to take this  additional instructions   nitroGLYCERIN 0.4 MG SL tablet Commonly known as:  NITROSTAT Place 0.4 mg under the tongue every 5 (five) minutes as needed (for chest pain).      Follow-up Information    Eustaquio Boyden, MD. Schedule an appointment as soon as possible for a visit in 1 week(s).   Specialty:  Family Medicine Contact information: [address] [place] [address] [phone]         Nahser, Deloris Ping, MD .   Specialty:  Cardiology Contact information: [address] [place] [address] [phone]        Micki Riley, MD. Schedule an appointment as soon as possible for a visit in 4 week(s).   Specialties:  Neurology, Radiology Contact information: [address] [place] [address] [phone]          Allergies  Allergen Reactions  . Benazepril Hcl Other (See Comments)    Lethargic, nervous, nauseated    Consultations:   neurology  Other Procedures/Studies: Ct Angio Head W Or Wo Contrast  Result Date: 07/25/2017 CLINICAL DATA:  Stroke follow-up EXAM: CT ANGIOGRAPHY HEAD AND NECK TECHNIQUE: Multidetector CT imaging of the head and neck was performed using the standard protocol during bolus administration of intravenous contrast. Multiplanar CT image reconstructions and MIPs were obtained to evaluate the vascular anatomy. Carotid stenosis measurements (when applicable) are obtained utilizing NASCET criteria, using the distal internal carotid diameter as the denominator. CONTRAST:  50 cc Isovue 370 intravenous COMPARISON:  Head CT from earlier today FINDINGS: CT HEAD FINDINGS Brain: Remote infarcts in the posterior left cerebrum and right cerebellum. Small acute infarct in the right cerebrum on previous brain  MRI is not resolved. There is a small right lateral frontal infarct that is remote by MRI. Advanced chronic small vessel ischemia in the cerebral white matter. No hemorrhage, hydrocephalus, or masslike finding. Vascular: See below Skull: No acute or aggressive finding Sinuses: Clear Orbits: Bilateral cataract resection Review of the MIP images confirms the above findings CTA NECK FINDINGS Aortic arch: Atherosclerotic calcification.  No acute finding. Right carotid system: Moderate primarily calcified plaque at the common carotid bifurcation and ICA bulb without flow limiting stenosis or ulceration. Left carotid system:  Moderate proximal ICA calcified plaque with stenosis measuring up to 50% on coronal reformats. No ulceration or dissection. Vertebral arteries: Proximal subclavian plaque without flow limiting stenosis. Moderate atheromatous narrowing of the right vertebral origin. Both vertebral arteries are patent to the dura. Skeleton: Degenerative changes without acute or aggressive finding. Other neck: No acute finding Upper chest: Right lobar and multiple bilateral segmental to subsegmental pulmonary emboli as marked on series 13 these appear recent/acute. Review of the MIP images confirms the above findings CTA HEAD FINDINGS Anterior circulation: Extensive atherosclerotic plaque on the carotid siphons. Negative for branch occlusion. High-grade left M2 branch stenosis, see axial MIPS. Hypoplastic left A1 segment. Negative for aneurysm Posterior circulation: Vertebrobasilar arteries are smooth and diffusely patent. Fetal type left PCA. Mild-to-moderate bilateral PCA atheromatous irregularity. Venous sinuses: Patent on the delayed phase. Anatomic variants: As above Delayed phase: No abnormal intracranial enhancement. Critical Value/emergent results were called by telephone at the time of interpretation on 07/25/2017 at 1:19 pm to Dr. Junious Silk , who verbally acknowledged these results. Review of the MIP images confirms the above findings IMPRESSION: 1. Multiple acute pulmonary emboli bilaterally. 2. No emergent large vessel occlusion. 3. Cervical carotid atherosclerosis with up to 50% ICA stenosis on the left. 4. Moderate right vertebral origin stenosis. 5. High-grade left M2 branch stenosis. Electronically Signed   By: Marnee Spring M.D.   On: 07/25/2017 13:27   Ct Head Wo Contrast  Result Date: 07/25/2017 CLINICAL DATA:  Syncopal episode, loss of consciousness. Recently discharged from hospital for urinary tract infection. History of stroke, atrial fibrillation. EXAM: CT HEAD WITHOUT CONTRAST TECHNIQUE: Contiguous  axial images were obtained from the base of the skull through the vertex without intravenous contrast. COMPARISON:  CT HEAD September 07, 2016 FINDINGS: BRAIN: No intraparenchymal hemorrhage, mass effect nor midline shift. Slight blurring of the RIGHT parietal gray-white matter junction. Smaller RIGHT frontal lobe encephalomalacia. Confluent LEFT temporoparietal encephalomalacia with ex vacuo dilatation subjacent ventricle. Moderate to severe parenchymal brain volume loss. Confluent supratentorial white matter hypodensities. Old RIGHT cerebellar infarct. Old LEFT basal ganglia infarct. Old small LEFT occipital lobe infarct. No abnormal extra-axial fluid collections. VASCULAR: Moderate calcific atherosclerosis of the carotid siphons. SKULL: No skull fracture. Osteopenia. No significant scalp soft tissue swelling. SINUSES/ORBITS: Atretic RIGHT maxillary sinus with bony remodeling consistent with chronic sinusitis. Mild maxillary sinus mucosal thickening. Mastoid air cells are well aerated. Status post bilateral ocular lens implants. Enophthalmos. OTHER: None. IMPRESSION: 1. Acute small RIGHT parietal/MCA territory nonhemorrhagic infarct. Old RIGHT frontal/MCA territory infarct. 2. Old large LEFT MCA territory and small LEFT PCA territory infarct. Old RIGHT cerebellar infarcts. Old LEFT basal ganglia infarct. 3. Moderate to severe chronic small vessel ischemic changes. 4. Moderate to severe parenchymal brain volume loss. 5. Acute findings discussed with and reconfirmed by Dr.CHRISTOPHER POLLINA on 07/25/2017 at 2:24 am. Electronically Signed   By: Awilda Metro M.D.   On: 07/25/2017 02:26   Ct Angio Neck W Or Wo  Contrast  Result Date: 07/25/2017 CLINICAL DATA:  Stroke follow-up EXAM: CT ANGIOGRAPHY HEAD AND NECK TECHNIQUE: Multidetector CT imaging of the head and neck was performed using the standard protocol during bolus administration of intravenous contrast. Multiplanar CT image reconstructions and MIPs were  obtained to evaluate the vascular anatomy. Carotid stenosis measurements (when applicable) are obtained utilizing NASCET criteria, using the distal internal carotid diameter as the denominator. CONTRAST:  50 cc Isovue 370 intravenous COMPARISON:  Head CT from earlier today FINDINGS: CT HEAD FINDINGS Brain: Remote infarcts in the posterior left cerebrum and right cerebellum. Small acute infarct in the right cerebrum on previous brain MRI is not resolved. There is a small right lateral frontal infarct that is remote by MRI. Advanced chronic small vessel ischemia in the cerebral white matter. No hemorrhage, hydrocephalus, or masslike finding. Vascular: See below Skull: No acute or aggressive finding Sinuses: Clear Orbits: Bilateral cataract resection Review of the MIP images confirms the above findings CTA NECK FINDINGS Aortic arch: Atherosclerotic calcification.  No acute finding. Right carotid system: Moderate primarily calcified plaque at the common carotid bifurcation and ICA bulb without flow limiting stenosis or ulceration. Left carotid system: Moderate proximal ICA calcified plaque with stenosis measuring up to 50% on coronal reformats. No ulceration or dissection. Vertebral arteries: Proximal subclavian plaque without flow limiting stenosis. Moderate atheromatous narrowing of the right vertebral origin. Both vertebral arteries are patent to the dura. Skeleton: Degenerative changes without acute or aggressive finding. Other neck: No acute finding Upper chest: Right lobar and multiple bilateral segmental to subsegmental pulmonary emboli as marked on series 13 these appear recent/acute. Review of the MIP images confirms the above findings CTA HEAD FINDINGS Anterior circulation: Extensive atherosclerotic plaque on the carotid siphons. Negative for branch occlusion. High-grade left M2 branch stenosis, see axial MIPS. Hypoplastic left A1 segment. Negative for aneurysm Posterior circulation: Vertebrobasilar arteries  are smooth and diffusely patent. Fetal type left PCA. Mild-to-moderate bilateral PCA atheromatous irregularity. Venous sinuses: Patent on the delayed phase. Anatomic variants: As above Delayed phase: No abnormal intracranial enhancement. Critical Value/emergent results were called by telephone at the time of interpretation on 07/25/2017 at 1:19 pm to Dr. Junious Silk , who verbally acknowledged these results. Review of the MIP images confirms the above findings IMPRESSION: 1. Multiple acute pulmonary emboli bilaterally. 2. No emergent large vessel occlusion. 3. Cervical carotid atherosclerosis with up to 50% ICA stenosis on the left. 4. Moderate right vertebral origin stenosis. 5. High-grade left M2 branch stenosis. Electronically Signed   By: Marnee Spring M.D.   On: 07/25/2017 13:27   Mr Brain Wo Contrast  Result Date: 07/25/2017 CLINICAL DATA:  84 year old male post fall. Possible syncope. Subsequent encounter. EXAM: MRI HEAD WITHOUT CONTRAST TECHNIQUE: Multiplanar, multiecho pulse sequences of the brain and surrounding structures were obtained without intravenous contrast. COMPARISON:  07/25/2017 head CT.  03/25/2010 brain MR. FINDINGS: Brain: Tiny acute nonhemorrhagic posterior right frontal lobe infarct. Moderately large remote left frontal-parietal-posterior temporal lobe infarct with blood-stained encephalomalacia. Subsequent mild dilation left lateral ventricle. Remote posterior right temporal-frontal lobe infarct. Remote left occipital lobe infarct with blood-stained encephalomalacia. Remote inferior right cerebellar infarct with blood-stained encephalomalacia. Remote partially hemorrhagic left cerebellar infarct. Scattered tiny blood breakdown products consistent with areas of prior hemorrhagic ischemia. Prominent chronic microvascular changes. Moderate global atrophy. No intracranial mass lesion noted on this unenhanced exam. Vascular: Major intracranial vascular structures are patent.  Atherosclerotic changes suspected without large vessel occlusion. Skull and upper cervical spine: Motion degraded. Transverse ligament hypertrophy.  Degenerative changes upper cervical spine. Sinuses/Orbits: Post lens replacement without acute orbital abnormality. Mild mucosal thickening inferior maxillary sinuses. Minimal mucosal thickening ethmoid sinus air cells. Other: Negative. IMPRESSION: Tiny acute nonhemorrhagic posterior right frontal lobe infarct. Multiple remote infarcts as detailed above. Chronic microvascular changes. Atrophy. Electronically Signed   By: Lacy DuverneySteven  Olson M.D.   On: 07/25/2017 08:14   Ct Abdomen Pelvis W Contrast  Result Date: 07/25/2017 CLINICAL DATA:  Syncopal episode after standing up. Vomiting. On antibiotics for urinary tract infection. History of appendectomy, hemorrhoid surgery. EXAM: CT ABDOMEN AND PELVIS WITH CONTRAST TECHNIQUE: Multidetector CT imaging of the abdomen and pelvis was performed using the standard protocol following bolus administration of intravenous contrast. CONTRAST:  100mL OMNIPAQUE IOHEXOL 300 MG/ML  SOLN COMPARISON:  Renal ultrasound July 13, 2017 FINDINGS: LOWER CHEST: Bilateral lower lobe atelectasis/scarring. 3 mm RIGHT lower lobe ground-glass pulmonary nodule, no routine indicated follow-up. The heart size is mildly enlarged. No pericardial effusion. Mild coronary artery calcification. Patchy hypodensities LEFT atrium. Enlarged heterogeneous RIGHT atrium. HEPATOBILIARY: Liver and gallbladder are normal. PANCREAS: Normal. SPLEEN: Splenosis. ADRENALS/URINARY TRACT: Kidneys are orthotopic, demonstrating symmetric enhancement. No nephrolithiasis, hydronephrosis or solid renal masses. Complex lobulated 5 cm RIGHT lower pole renal cyst with thin calcified septations. Bilateral simple renal cysts measuring to 2.6 cm. Exophytic dense 1 cm LEFT lower pole proteinaceous cysts. The unopacified ureters are normal in course and caliber. Delayed imaging through the  kidneys demonstrates symmetric prompt contrast excretion within the proximal urinary collecting system. Urinary bladder is decompressed by Foley catheter. 19 mm LEFT adrenal nodule (20 Hounsfield on early phase, 15 Hounsfield units on delayed phase). STOMACH/BOWEL: The stomach, small and large bowel are normal in course and caliber without inflammatory changes. Mild colonic diverticulosis. VASCULAR/LYMPHATIC: Mildly ectatic infrarenal aorta with moderate calcific atherosclerosis. No lymphadenopathy by CT size criteria. REPRODUCTIVE: In transaxial Prostatomegaly, prostate dimension is. 7.4 cm OTHER: Minimal free fluid in the pelvis may be physiologic. MUSCULOSKELETAL: Nonacute. Small fat containing inguinal hernias. RIGHT anterior abdominal wall scarring. Osteopenia. Ankylosis of the sacroiliac joints. Old RIGHT L3 transverse process fracture. Multiple Schmorl's nodes. Advanced degenerative changes spine. IMPRESSION: 1. Possible filling defects LEFT atrium. Given probable recent stroke, these are suspicious for clot. 2. Prostatomegaly. Decompressed urinary bladder with Foley catheter. No obstructive uropathy. 3. **An incidental finding of potential clinical significance has been found. 19 mm LEFT adrenal nodule, likely benign. Consider 12 month follow-up CT adrenal protocol. At the time recommend close attention to complex RIGHT renal cyst.** Aortic Atherosclerosis (ICD10-I70.0). Electronically Signed   By: Awilda Metroourtnay  Bloomer M.D.   On: 07/25/2017 05:11   [place] Renal  Result Date: 07/13/2017 CLINICAL DATA:  Acute onset of renal insufficiency. EXAM: RENAL / URINARY TRACT ULTRASOUND COMPLETE COMPARISON:  None. FINDINGS: Right Kidney: Length: 10.9 cm. Echogenicity within normal limits. A mildly complex cystic lesion is noted at the lower pole of the right kidney, measuring 5.0 x 3.6 x 3.6 cm. This contains a few septations. Additional cysts are noted at the right kidney, measuring up to 4.6 cm in size. No hydronephrosis  is seen. Left Kidney: Length: 11.3 cm. Echogenicity within normal limits. Left renal cysts measure up to 1.9 cm in size. No hydronephrosis visualized. Bladder: A Foley catheter is noted within the bladder. Debris is seen dependently within the bladder. Bladder wall thickening may reflect cystitis or chronic inflammation. IMPRESSION: 1. Bladder wall thickening may reflect cystitis or chronic inflammation. Debris noted dependently within the bladder. 2. Mildly complex cystic lesion at the lower pole  of the right kidney, measuring 5.0 cm. This contains a few septations. Would correlate with any prior available imaging. 3. No evidence of hydronephrosis. 4. Bilateral renal cysts noted. Electronically Signed   By: Roanna Raider M.D.   On: 07/13/2017 23:31   Dg Chest Port 1 View  Result Date: 07/25/2017 CLINICAL DATA:  Larey Seat hit head, shortness of breath tonight. History of CHF. EXAM: PORTABLE CHEST 1 VIEW COMPARISON:  Chest radiograph July 14, 2017 FINDINGS: Cardiac silhouette is moderately enlarged. Calcified aortic arch. Chronic interstitial changes LEFT pleural effusion focal consolidation. No pneumothorax. Osteopenia. Soft tissue planes are non suspicious. IMPRESSION: Stable cardiomegaly and mild chronic interstitial changes. Aortic Atherosclerosis (ICD10-I70.0). Electronically Signed   By: Awilda Metro M.D.   On: 07/25/2017 00:53   Dg Chest Port 1 View  Result Date: 07/13/2017 CLINICAL DATA:  Fever, congestion, shortness of breath. EXAM: PORTABLE CHEST 1 VIEW COMPARISON:  Chest x-ray dated September 07, 2016. FINDINGS: The patient is rotated to the left. Mild cardiomegaly. Atherosclerotic calcification of the aortic arch. Stable linear scarring/atelectasis at the lung bases. No focal consolidation, pleural effusion, or pneumothorax. No acute osseous abnormality. IMPRESSION: No active cardiopulmonary disease. Electronically Signed   By: Obie Dredge M.D.   On: 07/13/2017 17:06      TODAY-DAY OF  DISCHARGE:  Subjective:   Paulla Dolly today has no headache,no chest abdominal pain,no new weakness tingling or numbness, feels much better wants to go home today.  Objective:   Blood pressure 125/65, pulse 63, temperature 98.4 F (36.9 C), temperature source Oral, resp. rate 20, height 6' (1.829 m), weight 95 kg (209 lb 7 oz), SpO2 99 %.  Intake/Output Summary (Last 24 hours) at 07/28/2017 0950 Last data filed at 07/28/2017 0703 Gross per 24 hour  Intake 408 ml  Output 1550 ml  Net -1142 ml   Filed Weights   07/25/17 1100  Weight: 95 kg (209 lb 7 oz)    Exam: Awake Alert, Oriented *3, No new F.N deficits, Normal affect [place].AT,PERRAL Supple Neck,No JVD, No cervical lymphadenopathy appriciated.  Symmetrical Chest wall movement, Good air movement bilaterally, CTAB RRR,No Gallops,Rubs or new Murmurs, No Parasternal Heave +ve B.Sounds, Abd Soft, Non tender, No organomegaly appriciated, No rebound -guarding or rigidity. No Cyanosis, Clubbing or edema, No new Rash or bruise   PERTINENT RADIOLOGIC STUDIES: Ct Angio Head W Or Wo Contrast  Result Date: 07/25/2017 CLINICAL DATA:  Stroke follow-up EXAM: CT ANGIOGRAPHY HEAD AND NECK TECHNIQUE: Multidetector CT imaging of the head and neck was performed using the standard protocol during bolus administration of intravenous contrast. Multiplanar CT image reconstructions and MIPs were obtained to evaluate the vascular anatomy. Carotid stenosis measurements (when applicable) are obtained utilizing NASCET criteria, using the distal internal carotid diameter as the denominator. CONTRAST:  50 cc Isovue 370 intravenous COMPARISON:  Head CT from earlier today FINDINGS: CT HEAD FINDINGS Brain: Remote infarcts in the posterior left cerebrum and right cerebellum. Small acute infarct in the right cerebrum on previous brain MRI is not resolved. There is a small right lateral frontal infarct that is remote by MRI. Advanced chronic small vessel ischemia in  the cerebral white matter. No hemorrhage, hydrocephalus, or masslike finding. Vascular: See below Skull: No acute or aggressive finding Sinuses: Clear Orbits: Bilateral cataract resection Review of the MIP images confirms the above findings CTA NECK FINDINGS Aortic arch: Atherosclerotic calcification.  No acute finding. Right carotid system: Moderate primarily calcified plaque at the common carotid bifurcation and ICA bulb without flow  limiting stenosis or ulceration. Left carotid system: Moderate proximal ICA calcified plaque with stenosis measuring up to 50% on coronal reformats. No ulceration or dissection. Vertebral arteries: Proximal subclavian plaque without flow limiting stenosis. Moderate atheromatous narrowing of the right vertebral origin. Both vertebral arteries are patent to the dura. Skeleton: Degenerative changes without acute or aggressive finding. Other neck: No acute finding Upper chest: Right lobar and multiple bilateral segmental to subsegmental pulmonary emboli as marked on series 13 these appear recent/acute. Review of the MIP images confirms the above findings CTA HEAD FINDINGS Anterior circulation: Extensive atherosclerotic plaque on the carotid siphons. Negative for branch occlusion. High-grade left M2 branch stenosis, see axial MIPS. Hypoplastic left A1 segment. Negative for aneurysm Posterior circulation: Vertebrobasilar arteries are smooth and diffusely patent. Fetal type left PCA. Mild-to-moderate bilateral PCA atheromatous irregularity. Venous sinuses: Patent on the delayed phase. Anatomic variants: As above Delayed phase: No abnormal intracranial enhancement. Critical Value/emergent results were called by telephone at the time of interpretation on 07/25/2017 at 1:19 pm to Dr. Junious Silk , who verbally acknowledged these results. Review of the MIP images confirms the above findings IMPRESSION: 1. Multiple acute pulmonary emboli bilaterally. 2. No emergent large vessel occlusion. 3.  Cervical carotid atherosclerosis with up to 50% ICA stenosis on the left. 4. Moderate right vertebral origin stenosis. 5. High-grade left M2 branch stenosis. Electronically Signed   By: Marnee Spring M.D.   On: 07/25/2017 13:27   Ct Head Wo Contrast  Result Date: 07/25/2017 CLINICAL DATA:  Syncopal episode, loss of consciousness. Recently discharged from hospital for urinary tract infection. History of stroke, atrial fibrillation. EXAM: CT HEAD WITHOUT CONTRAST TECHNIQUE: Contiguous axial images were obtained from the base of the skull through the vertex without intravenous contrast. COMPARISON:  CT HEAD September 07, 2016 FINDINGS: BRAIN: No intraparenchymal hemorrhage, mass effect nor midline shift. Slight blurring of the RIGHT parietal gray-white matter junction. Smaller RIGHT frontal lobe encephalomalacia. Confluent LEFT temporoparietal encephalomalacia with ex vacuo dilatation subjacent ventricle. Moderate to severe parenchymal brain volume loss. Confluent supratentorial white matter hypodensities. Old RIGHT cerebellar infarct. Old LEFT basal ganglia infarct. Old small LEFT occipital lobe infarct. No abnormal extra-axial fluid collections. VASCULAR: Moderate calcific atherosclerosis of the carotid siphons. SKULL: No skull fracture. Osteopenia. No significant scalp soft tissue swelling. SINUSES/ORBITS: Atretic RIGHT maxillary sinus with bony remodeling consistent with chronic sinusitis. Mild maxillary sinus mucosal thickening. Mastoid air cells are well aerated. Status post bilateral ocular lens implants. Enophthalmos. OTHER: None. IMPRESSION: 1. Acute small RIGHT parietal/MCA territory nonhemorrhagic infarct. Old RIGHT frontal/MCA territory infarct. 2. Old large LEFT MCA territory and small LEFT PCA territory infarct. Old RIGHT cerebellar infarcts. Old LEFT basal ganglia infarct. 3. Moderate to severe chronic small vessel ischemic changes. 4. Moderate to severe parenchymal brain volume loss. 5. Acute findings  discussed with and reconfirmed by Dr.CHRISTOPHER POLLINA on 07/25/2017 at 2:24 am. Electronically Signed   By: Awilda Metro M.D.   On: 07/25/2017 02:26   Ct Angio Neck W Or Wo Contrast  Result Date: 07/25/2017 CLINICAL DATA:  Stroke follow-up EXAM: CT ANGIOGRAPHY HEAD AND NECK TECHNIQUE: Multidetector CT imaging of the head and neck was performed using the standard protocol during bolus administration of intravenous contrast. Multiplanar CT image reconstructions and MIPs were obtained to evaluate the vascular anatomy. Carotid stenosis measurements (when applicable) are obtained utilizing NASCET criteria, using the distal internal carotid diameter as the denominator. CONTRAST:  50 cc Isovue 370 intravenous COMPARISON:  Head CT from earlier today FINDINGS: CT  HEAD FINDINGS Brain: Remote infarcts in the posterior left cerebrum and right cerebellum. Small acute infarct in the right cerebrum on previous brain MRI is not resolved. There is a small right lateral frontal infarct that is remote by MRI. Advanced chronic small vessel ischemia in the cerebral white matter. No hemorrhage, hydrocephalus, or masslike finding. Vascular: See below Skull: No acute or aggressive finding Sinuses: Clear Orbits: Bilateral cataract resection Review of the MIP images confirms the above findings CTA NECK FINDINGS Aortic arch: Atherosclerotic calcification.  No acute finding. Right carotid system: Moderate primarily calcified plaque at the common carotid bifurcation and ICA bulb without flow limiting stenosis or ulceration. Left carotid system: Moderate proximal ICA calcified plaque with stenosis measuring up to 50% on coronal reformats. No ulceration or dissection. Vertebral arteries: Proximal subclavian plaque without flow limiting stenosis. Moderate atheromatous narrowing of the right vertebral origin. Both vertebral arteries are patent to the dura. Skeleton: Degenerative changes without acute or aggressive finding. Other neck: No  acute finding Upper chest: Right lobar and multiple bilateral segmental to subsegmental pulmonary emboli as marked on series 13 these appear recent/acute. Review of the MIP images confirms the above findings CTA HEAD FINDINGS Anterior circulation: Extensive atherosclerotic plaque on the carotid siphons. Negative for branch occlusion. High-grade left M2 branch stenosis, see axial MIPS. Hypoplastic left A1 segment. Negative for aneurysm Posterior circulation: Vertebrobasilar arteries are smooth and diffusely patent. Fetal type left PCA. Mild-to-moderate bilateral PCA atheromatous irregularity. Venous sinuses: Patent on the delayed phase. Anatomic variants: As above Delayed phase: No abnormal intracranial enhancement. Critical Value/emergent results were called by telephone at the time of interpretation on 07/25/2017 at 1:19 pm to Dr. Junious Silk , who verbally acknowledged these results. Review of the MIP images confirms the above findings IMPRESSION: 1. Multiple acute pulmonary emboli bilaterally. 2. No emergent large vessel occlusion. 3. Cervical carotid atherosclerosis with up to 50% ICA stenosis on the left. 4. Moderate right vertebral origin stenosis. 5. High-grade left M2 branch stenosis. Electronically Signed   By: Marnee Spring M.D.   On: 07/25/2017 13:27   Mr Brain Wo Contrast  Result Date: 07/25/2017 CLINICAL DATA:  84 year old male post fall. Possible syncope. Subsequent encounter. EXAM: MRI HEAD WITHOUT CONTRAST TECHNIQUE: Multiplanar, multiecho pulse sequences of the brain and surrounding structures were obtained without intravenous contrast. COMPARISON:  07/25/2017 head CT.  03/25/2010 brain MR. FINDINGS: Brain: Tiny acute nonhemorrhagic posterior right frontal lobe infarct. Moderately large remote left frontal-parietal-posterior temporal lobe infarct with blood-stained encephalomalacia. Subsequent mild dilation left lateral ventricle. Remote posterior right temporal-frontal lobe infarct. Remote  left occipital lobe infarct with blood-stained encephalomalacia. Remote inferior right cerebellar infarct with blood-stained encephalomalacia. Remote partially hemorrhagic left cerebellar infarct. Scattered tiny blood breakdown products consistent with areas of prior hemorrhagic ischemia. Prominent chronic microvascular changes. Moderate global atrophy. No intracranial mass lesion noted on this unenhanced exam. Vascular: Major intracranial vascular structures are patent. Atherosclerotic changes suspected without large vessel occlusion. Skull and upper cervical spine: Motion degraded. Transverse ligament hypertrophy. Degenerative changes upper cervical spine. Sinuses/Orbits: Post lens replacement without acute orbital abnormality. Mild mucosal thickening inferior maxillary sinuses. Minimal mucosal thickening ethmoid sinus air cells. Other: Negative. IMPRESSION: Tiny acute nonhemorrhagic posterior right frontal lobe infarct. Multiple remote infarcts as detailed above. Chronic microvascular changes. Atrophy. Electronically Signed   By: Lacy Duverney M.D.   On: 07/25/2017 08:14   Ct Abdomen Pelvis W Contrast  Result Date: 07/25/2017 CLINICAL DATA:  Syncopal episode after standing up. Vomiting. On antibiotics for urinary tract  infection. History of appendectomy, hemorrhoid surgery. EXAM: CT ABDOMEN AND PELVIS WITH CONTRAST TECHNIQUE: Multidetector CT imaging of the abdomen and pelvis was performed using the standard protocol following bolus administration of intravenous contrast. CONTRAST:  OMNIPAQUE IOHEXOL 300 MG/ML  SOLN COMPARISON:  Renal ultrasound July 13, 2017 FINDINGS: LOWER CHEST: Bilateral lower lobe atelectasis/scarring. 3 mm RIGHT lower lobe ground-glass pulmonary nodule, no routine indicated follow-up. The heart size is mildly enlarged. No pericardial effusion. Mild coronary artery calcification. Patchy hypodensities LEFT atrium. Enlarged heterogeneous RIGHT atrium. HEPATOBILIARY: Liver and  gallbladder are normal. PANCREAS: Normal. SPLEEN: Splenosis. ADRENALS/URINARY TRACT: Kidneys are orthotopic, demonstrating symmetric enhancement. No nephrolithiasis, hydronephrosis or solid renal masses. Complex lobulated 5 cm RIGHT lower pole renal cyst with thin calcified septations. Bilateral simple renal cysts measuring to 2.6 cm. Exophytic dense 1 cm LEFT lower pole proteinaceous cysts. The unopacified ureters are normal in course and caliber. Delayed imaging through the kidneys demonstrates symmetric prompt contrast excretion within the proximal urinary collecting system. Urinary bladder is decompressed by Foley catheter. 19 mm LEFT adrenal nodule (20 Hounsfield on early phase, 15 Hounsfield units on delayed phase). STOMACH/BOWEL: The stomach, small and large bowel are normal in course and caliber without inflammatory changes. Mild colonic diverticulosis. VASCULAR/LYMPHATIC: Mildly ectatic infrarenal aorta with moderate calcific atherosclerosis. No lymphadenopathy by CT size criteria. REPRODUCTIVE: In transaxial Prostatomegaly, prostate dimension is. 7.4 cm OTHER: Minimal free fluid in the pelvis may be physiologic. MUSCULOSKELETAL: Nonacute. Small fat containing inguinal hernias. RIGHT anterior abdominal wall scarring. Osteopenia. Ankylosis of the sacroiliac joints. Old RIGHT L3 transverse process fracture. Multiple Schmorl's nodes. Advanced degenerative changes spine. IMPRESSION: 1. Possible filling defects LEFT atrium. Given probable recent stroke, these are suspicious for clot. 2. Prostatomegaly. Decompressed urinary bladder with Foley catheter. No obstructive uropathy. 3. **An incidental finding of potential clinical significance has been found. 19 mm LEFT adrenal nodule, likely benign. Consider 12 month follow-up CT adrenal protocol. At the time recommend close attention to complex RIGHT renal cyst.** Aortic Atherosclerosis (ICD10-I70.0). Electronically Signed   By: Awilda Metro M.D.   On:  07/25/2017 05:11   US Renal  Result Date: 07/13/2017 CLINICAL DATA:  Acute onset of renal insufficiency. EXAM: RENAL / URINARY TRACT ULTRASOUND COMPLETE COMPARISON:  None. FINDINGS: Right Kidney: Length: 10.9 cm. Echogenicity within normal limits. A mildly complex cystic lesion is noted at the lower pole of the right kidney, measuring 5.0 x 3.6 x 3.6 cm. This contains a few septations. Additional cysts are noted at the right kidney, measuring up to 4.6 cm in size. No hydronephrosis is seen. Left Kidney: Length: 11.3 cm. Echogenicity within normal limits. Left renal cysts measure up to 1.9 cm in size. No hydronephrosis visualized. Bladder: A Foley catheter is noted within the bladder. Debris is seen dependently within the bladder. Bladder wall thickening may reflect cystitis or chronic inflammation. IMPRESSION: 1. Bladder wall thickening may reflect cystitis or chronic inflammation. Debris noted dependently within the bladder. 2. Mildly complex cystic lesion at the lower pole of the right kidney, measuring 5.0 cm. This contains a few septations. Would correlate with any prior available imaging. 3. No evidence of hydronephrosis. 4. Bilateral renal cysts noted. Electronically Signed   By: Roanna Raider M.D.   On: 07/13/2017 23:31   Dg Chest Port 1 View  Result Date: 07/25/2017 CLINICAL DATA:  Larey Seat hit head, shortness of breath tonight. History of CHF. EXAM: PORTABLE CHEST 1 VIEW COMPARISON:  Chest radiograph July 14, 2017 FINDINGS: Cardiac silhouette is moderately enlarged. Calcified  aortic arch. Chronic interstitial changes LEFT pleural effusion focal consolidation. No pneumothorax. Osteopenia. Soft tissue planes are non suspicious. IMPRESSION: Stable cardiomegaly and mild chronic interstitial changes. Aortic Atherosclerosis (ICD10-I70.0). Electronically Signed   By: Awilda Metro M.D.   On: 07/25/2017 00:53   Dg Chest Port 1 View  Result Date: 07/13/2017 CLINICAL DATA:  Fever, congestion, shortness of  breath. EXAM: PORTABLE CHEST 1 VIEW COMPARISON:  Chest x-ray dated September 07, 2016. FINDINGS: The patient is rotated to the left. Mild cardiomegaly. Atherosclerotic calcification of the aortic arch. Stable linear scarring/atelectasis at the lung bases. No focal consolidation, pleural effusion, or pneumothorax. No acute osseous abnormality. IMPRESSION: No active cardiopulmonary disease. Electronically Signed   By: Obie Dredge M.D.   On: 07/13/2017 17:06     PERTINENT LAB RESULTS: CBC: Recent Labs    07/27/17 0647 07/28/17 0502  WBC 13.0* 13.1*  HGB 12.9* 12.1*  HCT 39.8 37.5*  PLT 198 186   CMET CMP     Component Value Date/Time   NA 141 07/28/2017 0502   K 3.9 07/28/2017 0502   CL 108 07/28/2017 0502   CO2 26 07/28/2017 0502   GLUCOSE 123 (H) 07/28/2017 0502   BUN 30 (H) 07/28/2017 0502   CREATININE 1.42 (H) 07/28/2017 0502   CREATININE 1.14 (H) 12/22/2015 1500   CALCIUM 10.2 07/28/2017 0502   PROT 6.8 07/27/2017 0647   ALBUMIN 3.2 (L) 07/27/2017 0647   AST 26 07/27/2017 0647   ALT 21 07/27/2017 0647   ALKPHOS 57 07/27/2017 0647   BILITOT 1.0 07/27/2017 0647   GFRNONAA 42 (L) 07/28/2017 0502   GFRAA 49 (L) 07/28/2017 0502    GFR Estimated Creatinine Clearance: 42.2 mL/min (A) (by C-G formula based on SCr of 1.42 mg/dL (H)). No results for input(s): LIPASE, AMYLASE in the last 72 hours. No results for input(s): CKTOTAL, CKMB, CKMBINDEX, TROPONINI in the last 72 hours. Invalid input(s): POCBNP No results for input(s): DDIMER in the last 72 hours. Recent Labs    07/26/17 0640  HGBA1C 5.8*   Recent Labs    07/26/17 0640  CHOL 159  HDL 43  LDLCALC 103*  TRIG 65  CHOLHDL 3.7   Recent Labs    07/25/17 1549  TSH 3.617   No results for input(s): VITAMINB12, FOLATE, FERRITIN, TIBC, IRON, RETICCTPCT in the last 72 hours. Coags: Recent Labs    07/26/17 0640  INR 1.23   Microbiology: Recent Results (from the past 240 hour(s))  Urine Culture     Status:  Abnormal   Collection Time: 07/25/17 10:08 PM  Result Value Ref Range Status   Specimen Description URINE, RANDOM  Final   Special Requests   Final    NONE Performed at [facility], [address], [place], [place] [address]    Culture 40,000 COLONIES/mL PSEUDOMONAS AERUGINOSA (A)  Final   Report Status 07/28/2017 FINAL  Final   Organism ID, Bacteria PSEUDOMONAS AERUGINOSA (A)  Final      Susceptibility   Pseudomonas aeruginosa - MIC*    CEFTAZIDIME 4 SENSITIVE Sensitive     CIPROFLOXACIN >=4 RESISTANT Resistant     GENTAMICIN >=16 RESISTANT Resistant     IMIPENEM >=16 RESISTANT Resistant     PIP/TAZO 32 SENSITIVE Sensitive     CEFEPIME 8 SENSITIVE Sensitive     * 40,000 COLONIES/mL PSEUDOMONAS AERUGINOSA  Culture, blood (routine x 2)     Status: None (Preliminary result)   Collection Time: 07/26/17  6:40 PM  Result  Value Ref Range Status   Specimen Description BLOOD LEFT ANTECUBITAL  Final   Special Requests   Final    BOTTLES DRAWN AEROBIC AND ANAEROBIC Blood Culture adequate volume   Culture NO GROWTH < 24 HOURS  Final   Report Status PENDING  Incomplete  Culture, blood (routine x 2)     Status: None (Preliminary result)   Collection Time: 07/26/17  6:50 PM  Result Value Ref Range Status   Specimen Description BLOOD RIGHT HAND  Final   Special Requests   Final    BOTTLES DRAWN AEROBIC AND ANAEROBIC Blood Culture results may not be optimal due to an inadequate volume of blood received in culture bottles   Culture NO GROWTH < 24 HOURS  Final   Report Status PENDING  Incomplete    FURTHER DISCHARGE INSTRUCTIONS:  Get Medicines reviewed and adjusted: Please take all your medications with you for your next visit with your Primary MD  Laboratory/radiological data: Please request your Primary MD to go over all hospital tests and procedure/radiological results at the follow up, please ask your Primary MD to get all Hospital records sent to his/her office.  In some  cases, they will be blood work, cultures and biopsy results pending at the time of your discharge. Please request that your primary care M.D. goes through all the records of your hospital data and follows up on these results.  Also Note the following: If you experience worsening of your admission symptoms, develop shortness of breath, life threatening emergency, suicidal or homicidal thoughts you must seek medical attention immediately by calling 911 or calling your MD immediately  if symptoms less severe.  You must read complete instructions/literature along with all the possible adverse reactions/side effects for all the Medicines you take and that have been prescribed to you. Take any new Medicines after you have completely understood and accpet all the possible adverse reactions/side effects.   Do not drive when taking Pain medications or sleeping medications (Benzodaizepines)  Do not take more than prescribed Pain, Sleep and Anxiety Medications. It is not advisable to combine anxiety,sleep and pain medications without talking with your primary care practitioner  Special Instructions: If you have smoked or chewed Tobacco  in the last 2 yrs please stop smoking, stop any regular Alcohol  and or any Recreational drug use.  Wear Seat belts while driving.  Please note: You were cared for by a hospitalist during your hospital stay. Once you are discharged, your primary care physician will handle any further medical issues. Please note that NO REFILLS for any discharge medications will be authorized once you are discharged, as it is imperative that you return to your primary care physician (or establish a relationship with a primary care physician if you do not have one) for your post hospital discharge needs so that they can reassess your need for medications and monitor your lab values.  Total Time spent coordinating discharge including counseling, education and face to face time equals 35  minutes.  SignedJeoffrey Massed 07/28/2017 9:50 AM

## 2017-07-28 NOTE — Progress Notes (Signed)
CSW following for discharge plan. CSW has initiated insurance authorization request through [employer]. CSW contacted earlier to ensure that it was received and being worked on; it hadn't even been assigned. CSW received voicemail requesting additional information. CSW to send information and continue to follow.  Patient will need prior authorization in order to admit to SNF. CSW alerted MD of barrier to discharge.  CSW to follow.  Blenda NicelyElizabeth Yosef Krogh, [place] Clinical Social Worker [phone]

## 2017-07-28 NOTE — Discharge Instructions (Signed)
Information on my medicine - ELIQUIS (apixaban)  This medication education was reviewed with me or my healthcare representative as part of my discharge preparation.  Why was Eliquis prescribed for you? Eliquis was prescribed to treat blood clots that may have been found in the veins of your legs (deep vein thrombosis) or in your lungs (pulmonary embolism) and to reduce the risk of them occurring again.  What do You need to know about Eliquis ? The starting dose is 10 mg (two 5 mg tablets) taken TWICE daily for the FIRST SEVEN (7) DAYS, then on 08/04/2017  the dose is reduced to ONE 5 mg tablet taken TWICE daily.  Eliquis may be taken with or without food.   Try to take the dose about the same time in the morning and in the evening. If you have difficulty swallowing the tablet whole please discuss with your pharmacist how to take the medication safely.  Take Eliquis exactly as prescribed and DO NOT stop taking Eliquis without talking to the doctor who prescribed the medication.  Stopping may increase your risk of developing a new blood clot.  Refill your prescription before you run out.  After discharge, you should have regular check-up appointments with your healthcare provider that is prescribing your Eliquis.    What do you do if you miss a dose? If a dose of ELIQUIS is not taken at the scheduled time, take it as soon as possible on the same day and twice-daily administration should be resumed. The dose should not be doubled to make up for a missed dose.  Important Safety Information A possible side effect of Eliquis is bleeding. You should call your healthcare provider right away if you experience any of the following: ? Bleeding from an injury or your nose that does not stop. ? Unusual colored urine (red or dark brown) or unusual colored stools (red or black). ? Unusual bruising for unknown reasons. ? A serious fall or if you hit your head (even if there is no bleeding).  Some  medicines may interact with Eliquis and might increase your risk of bleeding or clotting while on Eliquis. To help avoid this, consult your healthcare provider or pharmacist prior to using any new prescription or non-prescription medications, including herbals, vitamins, non-steroidal anti-inflammatory drugs (NSAIDs) and supplements.  This website has more information on Eliquis (apixaban): http://www.eliquis.com/eliquis/home

## 2017-07-28 NOTE — Progress Notes (Signed)
Physical Therapy Treatment Patient Details Name: [name] MRN: [id number] DOB: October 12, 1925 Today's Date: 07/28/2017    History of Present Illness Patient is a 84 y/o male admitted on 07/25/17 after a syncopal episode at home + LOC. MRI done on 07/25/2017 revealed tiny acute nonhemorrhagic posterior right frontal lobe infarct and multiple remote infarcts.  Admitted for acute CVA. Of note patient with recent diagnosis of R DVT adn B PE's. 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     PT Comments    Patient doing well today - family present for session and providing motivation for patient. Does continue to require Min A/+2 for transfers and mobility with ability to take forward steps today within room with RW. Min A +2 for RW management, upright posturing and safety required for successful gait. Fatigue with short ambulation on RA - SaO2 remaining at 94% and above, but with supplemental O2 reapplied. Recommendations remain appropriate.    Follow Up Recommendations  SNF;Supervision/Assistance - 24 hour     Equipment Recommendations  Other (comment)(TBD)    Recommendations for Other Services       Precautions / Restrictions Precautions Precautions: Fall Precaution Comments: monitor vitals - DVT, PE Restrictions Weight Bearing Restrictions: No    Mobility  Bed Mobility Overal bed mobility: Needs Assistance Bed Mobility: Supine to Sit     Supine to sit: Min assist     General bed mobility comments: goo initiation of bed mobility with only Min A for LE management and upright posturing  Transfers Overall transfer level: Needs assistance Equipment used: Rolling walker (2 wheeled) Transfers: Sit to/from Stand Sit to Stand: Min assist;+2 physical assistance;From elevated surface         General transfer comment: VC for hand placement and  safety  Ambulation/Gait Ambulation/Gait assistance: Min assist;+2 physical assistance Gait Distance (Feet): 12 Feet Assistive device: Rolling walker (2 wheeled) Gait Pattern/deviations: Step-to pattern;Decreased stride length;Shuffle;Trunk flexed;Narrow base of support Gait velocity: decreased   General Gait Details: heavy VC for posturing, and safety with RW   Stairs             Wheelchair Mobility    Modified Rankin (Stroke Patients Only) Modified Rankin (Stroke Patients Only) Pre-Morbid Rankin Score: Moderate disability Modified Rankin: Severe disability     Balance Overall balance assessment: Needs assistance Sitting-balance support: Bilateral upper extremity supported;Feet supported Sitting balance-Leahy Scale: Fair     Standing balance support: Bilateral upper extremity supported;During functional activity Standing balance-Leahy Scale: Poor                              Cognition Arousal/Alertness: Awake/alert Behavior During Therapy: WFL for tasks assessed/performed Overall Cognitive Status: Impaired/Different from baseline Area of Impairment: Orientation;Following commands;Safety/judgement;Problem solving                 Orientation Level: Disoriented to;Place;Situation;Time     Following Commands: Follows one step commands with increased time Safety/Judgement: Decreased awareness of safety;Decreased awareness of deficits   Problem Solving: Slow processing;Decreased initiation;Difficulty sequencing;Requires verbal cues;Requires tactile cues General Comments: increased time and multimodal cues for following commands       Exercises General Exercises - Upper Extremity Shoulder Flexion: AROM;10 reps;AAROM;Both;Seated Shoulder Extension: AROM;AAROM;10 reps;Both;Seated Elbow Flexion: AROM;10 reps;Both;Seated Elbow Extension: AROM;10 reps;Seated;Both General Exercises - Lower Extremity Long Arc Quad: AROM;5 reps;Both;Seated  General Comments General comments (skin integrity, edema, etc.): family present during session; VSS       Pertinent Vitals/Pain Pain Assessment: No/denies pain Faces Pain Scale: Hurts a little bit Pain Location: generalized, pt reports no pain but noted intermittent grimacing  Pain Intervention(s): Monitored during session    Home Living                      Prior Function            PT Goals (current goals can now be found in the care plan section) Acute Rehab PT Goals Patient Stated Goal: to keep getting stronger  PT Goal Formulation: With patient Time For Goal Achievement: 08/10/17 Potential to Achieve Goals: Good Progress towards PT goals: Progressing toward goals    Frequency    Min 3X/week      PT Plan Current plan remains appropriate    Co-evaluation              AM-PAC PT "6 Clicks" Daily Activity  Outcome Measure  Difficulty turning over in bed (including adjusting bedclothes, sheets and blankets)?: A Little Difficulty moving from lying on back to sitting on the side of the bed? : Unable Difficulty sitting down on and standing up from a chair with arms (e.g., wheelchair, bedside commode, etc,.)?: Unable Help needed moving to and from a bed to chair (including a wheelchair)?: A Little Help needed walking in hospital room?: A Little Help needed climbing 3-5 steps with a railing? : A Lot 6 Click Score: 13    End of Session Equipment Utilized During Treatment: Gait belt Activity Tolerance: Patient tolerated treatment well Patient left: in chair;with call bell/phone within reach;with family/visitor present Nurse Communication: Mobility status PT Visit Diagnosis: Unsteadiness on feet (R26.81);Other abnormalities of gait and mobility (R26.89);Muscle weakness (generalized) (M62.81)     Time: 1012-1029 PT Time Calculation (min) (ACUTE ONLY): 17 min  Charges:  $Gait Training: 8-22 mins                    G Codes:       Kipp Laurence,  PT, DPT 07/28/17 11:42 AM

## 2017-07-28 NOTE — Clinical Social Work Note (Signed)
Clinical Social Work Assessment  Patient Details  Name: [name] MRN: [id number] Date of Birth: Nov 05, 1925  Date of referral:  07/28/17               Reason for consult:  Facility Placement                Permission sought to share information with:  Chartered certified accountant granted to share information::  Yes, Verbal Permission Granted  Name::     Karrie Meres  Agency::  [facility]  Relationship::  Spouse, Niece  Contact Information:     Housing/Transportation Living arrangements for the past 2 months:  Single Family Home Source of Information:  Spouse, Other (Comment Required) Patient Interpreter Needed:  None Criminal Activity/Legal Involvement Pertinent to Current Situation/Hospitalization:  No - Comment as needed Significant Relationships:  Other Family Members, Adult Children, Spouse Lives with:  Self, Spouse Do you feel safe going back to the place where you live?  Yes Need for family participation in patient care:  Yes (Comment)  Care giving concerns:  Patient from home with spouse but will need short term rehab at discharge.   Social Worker assessment / plan:  CSW met with patient's spouse and niece at bedside to discuss recommendation for SNF. CSW discussed facility options and obtained preference. CSW obtained permission to fax out referral and follow up with [facility] for placement. CSW to fax out insurance authorization request.  Employment status:  Retired Nurse, adult PT Recommendations:  [place] / Referral to community resources:  [place]  Patient/Family's Response to care:  Patient's family agreeable to SNF placement.  Patient/Family's Understanding of and Emotional Response to Diagnosis, Current Treatment, and Prognosis:  Patient's family discussed how they were not happy with the patient needing SNF at discharge because they've heard mixed reviews about some of the  facilities, but that they knew it would be the best idea for him. Patient's family hopeful that patient will recover and be able to go home soon.  Emotional Assessment Appearance:  Appears stated age Attitude/Demeanor/Rapport:  Unable to Assess Affect (typically observed):  Unable to Assess Orientation:  Oriented to Self Alcohol / Substance use:  Not Applicable Psych involvement (Current and /or in the community):  No (Comment)  Discharge Needs  Concerns to be addressed:  Care Coordination Readmission within the last 30 days:  Yes Current discharge risk:  Dependent with Mobility Barriers to Discharge:  Continued Medical Work up, [place], [place] 07/28/2017, 3:36 PM

## 2017-07-28 NOTE — Progress Notes (Addendum)
ADDENDUM  Consult received to transition patient to Eliquis for AFib as well as DVT/PE.  Based on age, SCr, and weight- patient does not meet dose reduction parameters (only meets age reduction currently), though noted that he is close to requiring a dose reduction given baseline SCr 1.3-1.5.  Plan: Stop heparin Start Eliquis 10mg  PO BID x7 full days, then transition to 5mg  PO BID starting on 6/27 If patient's SCr is consistently >1.5, Eliquis will need to be decreased to 2.5mg  PO BID. Recommend not doing this until a 3 month course has been completed for acute VTE treatment (ie use decreased dose for AFib treatment only)  [name], PharmD, BCPS Clinical Pharmacist [phone] Please check AMION for all [facility] numbers 07/28/2017 9:08 AM     ANTICOAGULATION CONSULT NOTE - Follow Up Consult  Pharmacy Consult for Heparin Indication: atrial fibrillation  Allergies  Allergen Reactions  . Benazepril Hcl Other (See Comments)    Lethargic, nervous, nauseated    Patient Measurements: Height: 6' (182.9 cm) Weight: 209 lb 7 oz (95 kg) IBW/kg (Calculated) : 77.6 Heparin Dosing Weight:  95kg  Vital Signs: Temp: 98.2 F (36.8 C) (06/20 0705) Temp Source: Oral (06/20 0705) BP: 146/62 (06/20 0705) Pulse Rate: 62 (06/20 0705)  Labs: Recent Labs    07/26/17 0640 07/27/17 0647 07/28/17 0502  HGB  --  12.9* 12.1*  HCT  --  39.8 37.5*  PLT  --  198 186  LABPROT 15.4*  --   --   INR 1.23  --   --   HEPARINUNFRC 0.57 0.41 0.29*  CREATININE  --  1.51* 1.42*    Estimated Creatinine Clearance: 42.2 mL/min (A) (by C-G formula based on SCr of 1.42 mg/dL (H)).  Assessment: 89 YOM on warfarin pta for afib, had been on hold until 6/19 d/t hematuria- patient had a follow up appointment on that day.  Started on heparin for DVT and PE. Heparin level is just below goal this morning at 0.29units/mL.  CBC wnl, no bleeding noted.  Goal of Therapy:  Heparin level 0.3-0.7  units/ml Monitor platelets by anticoagulation protocol: Yes   Plan:  Increase IV heparin to 1500 units/hr Recheck heparin level in 8h Daily Heparin level and CBC Follow for transition back to warfarin- noted DOAC options would be too expensive for patient  [name], PharmD, BCPS Clinical Pharmacist [phone] Please check AMION for all [facility] numbers 07/28/2017 8:46 AM

## 2017-07-28 NOTE — Progress Notes (Addendum)
Occupational Therapy Treatment Patient Details Name: [name] MRN: [id number] DOB: January 29, 1926 Today's Date: 07/28/2017    History of present illness Patient is a 84 y/o male admitted on 07/25/17 after a syncopal episode at home + LOC. MRI done on 07/25/2017 revealed tiny acute nonhemorrhagic posterior right frontal lobe infarct and multiple remote infarcts.  Admitted for acute CVA. Of note patient with recent diagnosis of R DVT adn B PE's. 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    OT comments  Pt progressing towards OT goals, presents sitting up in recliner agreeable to OT tx session. Pt completed simple grooming ADLs in sitting with setup assist; engaged in bil UE/LE exercises for additional strengthening and endurance. Pt notably fatigued with minimal activity during session requiring intermittent rest breaks throughout. Feel POC remains appropriate at this time. Will continue to follow acutely to progress pt towards established OT goals.   Follow Up Recommendations  SNF;Supervision/Assistance - 24 hour    Equipment Recommendations  Other (comment)(TBD in next venue )          Precautions / Restrictions Precautions Precautions: Fall Precaution Comments: monitor vitals - DVT, PE Restrictions Weight Bearing Restrictions: No       Mobility Bed Mobility               General bed mobility comments: OOB in recliner upon entry                                                                    ADL either performed or assessed with clinical judgement   ADL Overall ADL's : Needs assistance/impaired     Grooming: Set up;Sitting;Wash/dry face Grooming Details (indicate cue type and reason): seated in recliner                                General ADL Comments: Pt sitting up in recliner, agreeable  to tx session but appearing very fatigued; engaged in bil UE/LE exercise within pt tolerance and simple grooming ADLs                        Cognition Arousal/Alertness: Awake/alert Behavior During Therapy: WFL for tasks assessed/performed Overall Cognitive Status: Impaired/Different from baseline Area of Impairment: Orientation;Following commands;Safety/judgement;Problem solving                       Following Commands: Follows one step commands with increased time Safety/Judgement: Decreased awareness of safety;Decreased awareness of deficits   Problem Solving: Slow processing;Decreased initiation;Difficulty sequencing;Requires verbal cues;Requires tactile cues General Comments: increased time and multimodal cues for following commands         Exercises General Exercises - Upper Extremity Shoulder Flexion: AROM;10 reps;AAROM;Both;Seated Shoulder Extension: AROM;AAROM;10 reps;Both;Seated Elbow Flexion: AROM;10 reps;Both;Seated Elbow Extension: AROM;10 reps;Seated;Both General Exercises - Lower Extremity Long Arc Quad: AROM;5 reps;Both;Seated          General Comments family present during session; VSS     Pertinent Vitals/ Pain       Pain Assessment: Faces Faces Pain Scale: Hurts a  little bit Pain Location: generalized, pt reports no pain but noted intermittent grimacing  Pain Intervention(s): Monitored during session                                            Prior Functioning/Environment              Frequency  Min 2X/week        Progress Toward Goals  OT Goals(current goals can now be found in the care plan section)  Progress towards OT goals: Progressing toward goals  Acute Rehab OT Goals Patient Stated Goal: to keep getting stronger  OT Goal Formulation: With patient Time For Goal Achievement: 08/10/17 Potential to Achieve Goals: Good  Plan Discharge plan remains appropriate    Co-evaluation                  AM-PAC PT "6 Clicks" Daily Activity     Outcome Measure   Help from another person eating meals?: A Little Help from another person taking care of personal grooming?: A Little Help from another person toileting, which includes using toliet, bedpan, or urinal?: A Lot Help from another person bathing (including washing, rinsing, drying)?: A Lot Help from another person to put on and taking off regular upper body clothing?: A Little Help from another person to put on and taking off regular lower body clothing?: A Lot 6 Click Score: 15    End of Session Equipment Utilized During Treatment: Oxygen  OT Visit Diagnosis: Unsteadiness on feet (R26.81);Muscle weakness (generalized) (M62.81)   Activity Tolerance Patient tolerated treatment well;Patient limited by fatigue   Patient Left with family/visitor present;in chair;with call bell/phone within reach   Nurse Communication Mobility status        Time: 1610-9604 OT Time Calculation (min): 23 min  Charges: OT General Charges $OT Visit: 1 Visit OT Treatments $Therapeutic Activity: 8-22 mins  Marcy Siren, OT Pager [phone] 07/28/2017    [name] 07/28/2017, 11:29 AM

## 2017-07-28 NOTE — Plan of Care (Addendum)
Patient remained injury free throughout the shift. Patient's call bell remained within arms reach. His bed in the lowest position. His bed alarm remained set during the shift. Hourly rounding ensured that the patient remained safe. Will continue to monitor for safety.  Attempted to educate the patient but unsuccessful because the patient remained alert and oriented to self only. His cognition isn't clear therefore making it difficult to comprehend education.

## 2017-07-28 NOTE — Care Management Important Message (Signed)
Important Message  Patient Details  Name: [name] MRN: [id number] Date of Birth: 09-04-1925   Medicare Important Message Given:  Yes    Pollyann Roa P Lessly Stigler 07/28/2017, 3:10 PM

## 2017-07-28 NOTE — Evaluation (Signed)
Clinical/Bedside Swallow Evaluation Patient Details  Name: [name] MRN: [id number] Date of Birth: Apr 21, 1925  Today's Date: 07/28/2017 Time: SLP Start Time (ACUTE ONLY): 1350 SLP Stop Time (ACUTE ONLY): 1410 SLP Time Calculation (min) (ACUTE ONLY): 20 min  Past Medical History:  Past Medical History:  Diagnosis Date  . Atrial fibrillation (HCC)    chronic  . BPH (benign prostatic hyperplasia)   . CAD (coronary artery disease)    s/p MI, unsure when  . Chronic diastolic heart failure (HCC)   . Chronic indwelling Foley catheter   . CKD (chronic kidney disease), stage III (HCC)   . Cognitive impairment    from stroke  . 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 DOE (dyspnea on exertion)   . Gout   . HTN (hypertension)   . Hyperlipidemia    Past Surgical History:  Past Surgical History:  Procedure Laterality Date  . APPENDECTOMY  1994  . CARDIOVASCULAR STRESS TEST  01/11/2008   evidence of an old scar of the septum and a larger apical scar but no reversible ischemia, EF 48%  . CATARACT EXTRACTION Left 2008   [place]  . EXCISION MORTON'S NEUROMA  1995   x 2  . HEMORRHOID SURGERY    . TONSILLECTOMY  1964  . US ECHOCARDIOGRAPHY  12/19/2007   EF 55-60%, mild aortic stenosis and mild pulmonary hypertension and mild mitral regurgitation   HPI:  [name] is a 84 y.o. 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  He was in the kitchen with his wife and turned around and passed out.  LOC for a few seconds.  His neuro exam is difficult due to underlying dementia but he reports decreased sensation of the right face and leg and may have mild right facial droop.  He does have some dysarthria and slowing of his speech.  MRI shows a tiny acute CVA.   Assessment / Plan  / Recommendation Clinical Impression   Bedside swallow evaluation was ordered due to coughing on thin liquids noticed by family and nursing during meals.  Pt is afebrile and lungs are documented to be clear to auscultation per chart review.  Pt continues to have labored breathing as he did on cognitive evaluation (6/18).  Pt had no coughing with cup sips of thin liquids but had explosive coughing when consuming thins via straw.  Family reports that coughing this morning also appeared to coincide with straw sips of liquids.  As a result, recommend that pt remain on regular textures, thin liquids, no straws, meds whole in puree.  Provided extensive education regarding rationale for recommendations with pt's niece and wife who were present.  All questions were answered to their satisfaction at this time.  SLP will follow up for management of safe diet progression.   SLP Visit Diagnosis: Dysphagia, oropharyngeal phase (R13.12)    Aspiration Risk  Mild aspiration risk;Moderate aspiration risk    Diet Recommendation Regular;Thin liquid   Liquid Administration via: No straw Medication Administration: Whole meds with puree Supervision: Intermittent supervision to cue for compensatory strategies Compensations: Minimize environmental distractions;Slow rate;Small sips/bites Postural Changes: Seated upright at 90 degrees    Other  Recommendations Oral Care Recommendations: Oral care BID   Follow up Recommendations Skilled Nursing facility;24 hour supervision/assistance  Frequency and Duration min 2x/week          Prognosis Prognosis for Safe Diet Advancement: Good Barriers to Reach Goals: Cognitive deficits      Swallow Study   General Date of Onset: (see HPI) HPI: [name] is a 84 y.o. 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   He was in the kitchen with his wife and turned around and passed out.  LOC for a few seconds.  His neuro exam is difficult due to underlying dementia but he reports decreased sensation of the right face and leg and may have mild right facial droop.  He does have some dysarthria and slowing of his speech.  MRI shows a tiny acute CVA. Type of Study: Bedside Swallow Evaluation Previous Swallow Assessment: none on record Diet Prior to this Study: Regular;Thin liquids Temperature Spikes Noted: No Respiratory Status: Nasal cannula History of Recent Intubation: No Behavior/Cognition: Alert;Cooperative;Pleasant mood;Confused Oral Cavity Assessment: Within Functional Limits Oral Care Completed by SLP: No Oral Cavity - Dentition: Adequate natural dentition Vision: Functional for self-feeding Self-Feeding Abilities: Able to feed self Patient Positioning: Upright in chair Baseline Vocal Quality: Low vocal intensity;Breathy Volitional Cough: Strong Volitional Swallow: Unable to elicit    Oral/Motor/Sensory Function Overall Oral Motor/Sensory Function: Within functional limits   Ice Chips     Thin Liquid Thin Liquid: Impaired Presentation: Cup;Straw Pharyngeal  Phase Impairments: Cough - Immediate    Nectar Thick     Honey Thick     Puree     Solid   GO            [name], Joni Reining L 07/28/2017,2:20 PM

## 2017-07-28 NOTE — [place] FL2 (Signed)
[place] MEDICAID FL2 LEVEL OF CARE SCREENING TOOL     IDENTIFICATION  Patient Name: [name] File Birthdate: 06/11/1925 Sex: male Admission Date (Current Location): 07/25/2017  [facility] [place]   Facility and Address:  [facility], [address], [place], [place] [address]      Provider Number: [id number]  Attending Physician Name and Address:  Maretta BeesGhimire, Shanker M, MD  Relative Name and Phone Number:       Current Level of Care: Hospital Recommended Level of Care: Skilled Nursing Facility Prior Approval Number:    Date Approved/Denied:   PASRR Number: [id number]  Discharge Plan: SNF    Current Diagnoses: Patient Active Problem List   Diagnosis Date Noted  . Acute embolic stroke (HCC) 07/25/2017  . Atrial fibrillation (HCC) 07/25/2017  . History of cardioembolic cerebrovascular accident (CVA) 07/25/2017  . Hyperlipidemia 07/25/2017  . HTN (hypertension) 07/25/2017  . CKD (chronic kidney disease), stage III (HCC) 07/25/2017  . Chronic indwelling Foley catheter 07/25/2017  . History of BPH 07/25/2017  . Chronic diastolic heart failure (HCC) 07/25/2017  . ? Mural thrombus of left atrium 07/25/2017  . Gram-negative bacteremia 07/25/2017  . Bilateral pulmonary embolism (HCC) 07/25/2017  . Urinary tract infection associated with indwelling urethral catheter (HCC)   . Sepsis (HCC) 07/13/2017  . Acute lower UTI 07/13/2017  . AKI (acute kidney injury) (HCC) 07/13/2017  . Renal insufficiency 02/27/2017  . Health maintenance examination 10/06/2016  . Primary hyperparathyroidism (HCC) 10/06/2016  . Sacral pressure sore 10/06/2016  . Urinary incontinence 10/06/2016  . Tinea pedis 03/26/2016  . Medicare annual wellness visit, subsequent 10/31/2014  . Advanced care planning/counseling discussion 10/31/2014  . DNR (do not resuscitate) 10/31/2014  . Dyspnea on exertion 09/03/2013  . Encounter for therapeutic drug  monitoring 05/04/2013  . Gout 09/01/2012  . Lesion of skin of cheek 06/23/2012  . Cognitive impairment   . Permanent atrial fibrillation (HCC) 06/08/2010  . Long term (current) use of anticoagulants 06/08/2010  . Right bundle branch block 06/08/2010  . Aphasia, post-stroke 06/08/2010  . Aortic stenosis, mild 06/08/2010  . Chronic diastolic congestive heart failure (HCC) 06/08/2010  . Hypercholesterolemia 06/08/2010  . BPH (benign prostatic hyperplasia) 06/08/2010  . Essential hypertension 06/08/2010    Orientation RESPIRATION BLADDER Height & Weight     Self  O2([place] 2L) Incontinent Weight: 209 lb 7 oz (95 kg) Height:  6' (182.9 cm)  BEHAVIORAL SYMPTOMS/MOOD NEUROLOGICAL BOWEL NUTRITION STATUS      Incontinent Diet(heart healthy)  AMBULATORY STATUS COMMUNICATION OF NEEDS Skin   Limited Assist Verbally Normal                       Personal Care Assistance Level of Assistance  Bathing, Feeding, Dressing Bathing Assistance: Limited assistance Feeding assistance: Independent Dressing Assistance: Limited assistance     Functional Limitations Info  Sight, Hearing, Speech Sight Info: Adequate Hearing Info: Adequate Speech Info: Adequate    SPECIAL CARE FACTORS FREQUENCY  PT (By licensed PT), OT (By licensed OT)     PT Frequency: 5x/wk OT Frequency: 5x/wk            Contractures Contractures Info: Not present    Additional Factors Info  Code Status, Allergies Code Status Info: Full Allergies Info: Benazepril Hcl           Current Medications (07/28/2017):  This is the current hospital active medication list Current Facility-Administered Medications  Medication Dose Route Frequency  Provider Last Rate Last Dose  . acetaminophen (TYLENOL) tablet 650 mg  650 mg Oral Q4H PRN Russella Dar, NP       Or  . acetaminophen (TYLENOL) solution 650 mg  650 mg Per Tube Q4H PRN Russella Dar, NP       Or  . acetaminophen (TYLENOL) suppository 650 mg  650 mg Rectal  Q4H PRN Russella Dar, NP      . allopurinol (ZYLOPRIM) tablet 100 mg  100 mg Oral Daily Russella Dar, NP   100 mg at 07/28/17 0909  . apixaban (ELIQUIS) tablet 10 mg  10 mg Oral BID Bajbus, Lauren D, RPH       Followed by  . [START ON 08/04/2017] apixaban (ELIQUIS) tablet 5 mg  5 mg Oral BID Bajbus, Lauren D, RPH      . atorvastatin (LIPITOR) tablet 80 mg  80 mg Oral q1800 Russella Dar, NP   80 mg at 07/27/17 1702  . ceFEPIme (MAXIPIME) 2 g in sodium chloride 0.9 % 100 mL IVPB  2 g Intravenous Daily Rudisill, Toniann Fail, RPH 200 mL/hr at 07/27/17 0951 2 g at 07/27/17 0951  . digoxin (LANOXIN) tablet 125 mcg  125 mcg Oral QHS Russella Dar, NP   125 mcg at 07/27/17 2134  . finasteride (PROSCAR) tablet 5 mg  5 mg Oral Daily Russella Dar, NP   5 mg at 07/28/17 0909  . omega-3 acid ethyl esters (LOVAZA) capsule 1 g  1 g Oral q1800 Jonah Blue, MD   1 g at 07/27/17 1702     Discharge Medications: Please see discharge summary for a list of discharge medications.  Relevant Imaging Results:  Relevant Lab Results:   Additional Information SS#: [id number]  Baldemar Lenis, LCSW

## 2017-07-29 DIAGNOSIS — I2699 Other pulmonary embolism without acute cor pulmonale: Secondary | ICD-10-CM | POA: Diagnosis not present

## 2017-07-29 DIAGNOSIS — M6281 Muscle weakness (generalized): Secondary | ICD-10-CM | POA: Diagnosis not present

## 2017-07-29 DIAGNOSIS — I2510 Atherosclerotic heart disease of native coronary artery without angina pectoris: Secondary | ICD-10-CM | POA: Diagnosis not present

## 2017-07-29 DIAGNOSIS — I252 Old myocardial infarction: Secondary | ICD-10-CM | POA: Diagnosis not present

## 2017-07-29 DIAGNOSIS — Z960 Presence of urogenital implants: Secondary | ICD-10-CM | POA: Diagnosis not present

## 2017-07-29 DIAGNOSIS — N400 Enlarged prostate without lower urinary tract symptoms: Secondary | ICD-10-CM | POA: Diagnosis not present

## 2017-07-29 DIAGNOSIS — R740 Nonspecific elevation of levels of transaminase and lactic acid dehydrogenase [LDH]: Secondary | ICD-10-CM | POA: Diagnosis not present

## 2017-07-29 DIAGNOSIS — G459 Transient cerebral ischemic attack, unspecified: Secondary | ICD-10-CM | POA: Diagnosis not present

## 2017-07-29 DIAGNOSIS — I482 Chronic atrial fibrillation: Secondary | ICD-10-CM | POA: Diagnosis not present

## 2017-07-29 DIAGNOSIS — Z79899 Other long term (current) drug therapy: Secondary | ICD-10-CM | POA: Diagnosis not present

## 2017-07-29 DIAGNOSIS — I82401 Acute embolism and thrombosis of unspecified deep veins of right lower extremity: Secondary | ICD-10-CM | POA: Diagnosis not present

## 2017-07-29 DIAGNOSIS — Z7901 Long term (current) use of anticoagulants: Secondary | ICD-10-CM | POA: Diagnosis not present

## 2017-07-29 DIAGNOSIS — Z8673 Personal history of transient ischemic attack (TIA), and cerebral infarction without residual deficits: Secondary | ICD-10-CM | POA: Diagnosis not present

## 2017-07-29 DIAGNOSIS — Z888 Allergy status to other drugs, medicaments and biological substances status: Secondary | ICD-10-CM | POA: Diagnosis not present

## 2017-07-29 DIAGNOSIS — M2550 Pain in unspecified joint: Secondary | ICD-10-CM | POA: Diagnosis not present

## 2017-07-29 DIAGNOSIS — I639 Cerebral infarction, unspecified: Secondary | ICD-10-CM | POA: Diagnosis not present

## 2017-07-29 DIAGNOSIS — I130 Hypertensive heart and chronic kidney disease with heart failure and stage 1 through stage 4 chronic kidney disease, or unspecified chronic kidney disease: Secondary | ICD-10-CM | POA: Diagnosis not present

## 2017-07-29 DIAGNOSIS — I10 Essential (primary) hypertension: Secondary | ICD-10-CM | POA: Diagnosis not present

## 2017-07-29 DIAGNOSIS — Z515 Encounter for palliative care: Secondary | ICD-10-CM | POA: Diagnosis not present

## 2017-07-29 DIAGNOSIS — N390 Urinary tract infection, site not specified: Secondary | ICD-10-CM | POA: Diagnosis not present

## 2017-07-29 DIAGNOSIS — Z66 Do not resuscitate: Secondary | ICD-10-CM | POA: Diagnosis not present

## 2017-07-29 DIAGNOSIS — Z87891 Personal history of nicotine dependence: Secondary | ICD-10-CM | POA: Diagnosis not present

## 2017-07-29 DIAGNOSIS — R319 Hematuria, unspecified: Secondary | ICD-10-CM | POA: Diagnosis not present

## 2017-07-29 DIAGNOSIS — N029 Recurrent and persistent hematuria with unspecified morphologic changes: Secondary | ICD-10-CM | POA: Diagnosis not present

## 2017-07-29 DIAGNOSIS — I513 Intracardiac thrombosis, not elsewhere classified: Secondary | ICD-10-CM | POA: Diagnosis not present

## 2017-07-29 DIAGNOSIS — R338 Other retention of urine: Secondary | ICD-10-CM | POA: Diagnosis not present

## 2017-07-29 DIAGNOSIS — Z7401 Bed confinement status: Secondary | ICD-10-CM | POA: Diagnosis not present

## 2017-07-29 DIAGNOSIS — R2689 Other abnormalities of gait and mobility: Secondary | ICD-10-CM | POA: Diagnosis not present

## 2017-07-29 DIAGNOSIS — M109 Gout, unspecified: Secondary | ICD-10-CM | POA: Diagnosis not present

## 2017-07-29 DIAGNOSIS — E1122 Type 2 diabetes mellitus with diabetic chronic kidney disease: Secondary | ICD-10-CM | POA: Diagnosis not present

## 2017-07-29 DIAGNOSIS — N281 Cyst of kidney, acquired: Secondary | ICD-10-CM | POA: Diagnosis not present

## 2017-07-29 DIAGNOSIS — E785 Hyperlipidemia, unspecified: Secondary | ICD-10-CM | POA: Diagnosis not present

## 2017-07-29 DIAGNOSIS — R55 Syncope and collapse: Secondary | ICD-10-CM | POA: Diagnosis not present

## 2017-07-29 LAB — CBC
HCT: 36.9 % — ABNORMAL LOW (ref 39.0–52.0)
HEMOGLOBIN: 12 g/dL — AB (ref 13.0–17.0)
MCH: 32.3 pg (ref 26.0–34.0)
MCHC: 32.5 g/dL (ref 30.0–36.0)
MCV: 99.2 fL (ref 78.0–100.0)
Platelets: 191 10*3/uL (ref 150–400)
RBC: 3.72 MIL/uL — AB (ref 4.22–5.81)
RDW: 14 % (ref 11.5–15.5)
WBC: 13.7 10*3/uL — ABNORMAL HIGH (ref 4.0–10.5)

## 2017-07-29 NOTE — Progress Notes (Signed)
Charge Nurse Amanda agreed to complete patient's NIH because I'm not qualified to complete it.  

## 2017-07-29 NOTE — Progress Notes (Signed)
  Speech Language Pathology Treatment: Dysphagia  Patient Details Name: [name] MRN: [id number] DOB: 1926/02/03 Today's Date: 07/29/2017 Time: [id number] SLP Time Calculation (min) (ACUTE ONLY): 10 min  Assessment / Plan / Recommendation Clinical Impression  Pt was seen for skilled ST targeting dysphagia goals.  SLP completed skilled observations during presentations of thin liquids both with and without straws.  Pt demonstrated no overt s/s of aspiration with cup sips of thin liquids but had delayed coughing with straw sips.  Coughing episode was delayed and less intense in comparison to previous therapy session.  Discussed continuing with no straws recommendation at SNF and following up with speech therapy to continue to address goals for dysphagia and cognition.  All questions were answered to their satisfaction at this time.  Acute ST to sign off at this time.    HPI HPI: [name] is a 84 y.o. 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  He was in the kitchen with his wife and turned around and passed out.  LOC for a few seconds.  His neuro exam is difficult due to underlying dementia but he reports decreased sensation of the right face and leg and may have mild right facial droop.  He does have some dysarthria and slowing of his speech.  MRI shows a tiny acute CVA.      SLP Plan  Other (Comment)(pt discharging to SNF this evening, f/u with ST at SNF)       Recommendations  Diet recommendations: Regular;Thin liquid Liquids provided via: No straw Medication Administration: Whole meds with puree Supervision: Patient able to self feed Compensations: Minimize environmental distractions;Slow rate;Small sips/bites Postural Changes and/or Swallow Maneuvers: Out of bed for meals;Seated upright 90 degrees;Upright 30-60 min  after meal                Oral Care Recommendations: Oral care BID Follow up Recommendations: Skilled Nursing facility;24 hour supervision/assistance SLP Visit Diagnosis: Dysphagia, oropharyngeal phase (R13.12) Plan: Other (Comment)(pt discharging to SNF this evening, f/u with ST at SNF)       GO                Mickel Schreur, Melanee SpryNicole L 07/29/2017, 2:12 PM

## 2017-07-29 NOTE — Clinical Social Work Placement (Signed)
Nurse to call report to [phone], Room 907  Transport set for 4:00 PM.     CLINICAL SOCIAL WORK PLACEMENT  NOTE  Date:  07/29/2017  Patient Details  Name: [name] MRN: [id number] Date of Birth: 1925-12-28  Clinical Social Work is seeking post-discharge placement for this patient at the Skilled  Nursing Facility level of care (*CSW will initial, date and re-position this form in  chart as items are completed):  Yes   Patient/family provided with [place] Clinical Social Work Department's list of facilities offering this level of care within the geographic area requested by the patient (or if unable, by the patient's family).  Yes   Patient/family informed of their freedom to choose among providers that offer the needed level of care, that participate in Medicare, Medicaid or managed care program needed by the patient, have an available bed and are willing to accept the patient.  Yes   Patient/family informed of [place]'s ownership interest in [facility] and [facility], as well as of the fact that they are under no obligation to receive care at these facilities.  PASRR submitted to EDS on       PASRR number received on 07/28/17     Existing PASRR number confirmed on       FL2 transmitted to all facilities in geographic area requested by pt/family on 07/28/17     FL2 transmitted to all facilities within larger geographic area on       Patient informed that his/her managed care company has contracts with or will negotiate with certain facilities, including the following:        Yes   Patient/family informed of bed offers received.  [facility]     Physician recommends and patient chooses bed at      Patient to be transferred to [facility] on 07/29/17.  Patient to be transferred to facility by [facility]     Patient family notified on 07/29/17 of transfer.  Name of family member notified:  Wife, Niece     PHYSICIAN        Additional Comment:    _______________________________________________ Baldemar LenisElizabeth M Gilliam Hawkes, LCSW 07/29/2017, 2:17 PM

## 2017-07-29 NOTE — Care Management Note (Signed)
Case Management Note  Patient Details  Name: [name] MRN: [id number] Date of Birth: 12-Oct-1925  Subjective/Objective:                    Action/Plan: Pt discharging to [facility] today. CM signing off.    Expected Discharge Date:  07/28/17               Expected Discharge Plan:  Skilled Nursing Facility  In-House Referral:  Clinical Social Work  Discharge planning Services  CM Consult  Post Acute Care Choice:    Choice offered to:     DME Arranged:    DME Agency:     HH Arranged:    HH Agency:     Status of Service:  Completed, signed off  If discussed at [employer] of [employer] Meetings, dates discussed:    Additional Comments:  Kermit BaloKelli F Bertrand Vowels, RN 07/29/2017, 1:26 PM

## 2017-07-29 NOTE — Progress Notes (Signed)
Patient seen and examined, no major issues he denies any chest pain or shortness of breath.  He was sitting up by his chair and eating breakfast.  Awaiting SNF bed-okay to be discharged today-discharge summary done yesterday reviewed-still up-to-date.

## 2017-07-29 NOTE — Plan of Care (Signed)
Adequate for discharge.

## 2017-07-29 NOTE — Plan of Care (Signed)
Patient remained in the bedside recliner the first 2 hours of the shift. This is an improve from the previous day. Patient tolerated it well and transferred from bedside chair to the bed with 2 person assist.

## 2017-07-29 NOTE — Care Management Note (Signed)
Case Management Note  Patient Details  Name: [name] MRN: [id number] Date of Birth: May 17, 1925  Subjective/Objective:                    Action/Plan: Plan is for patient to d/c to SNF for rehab. CM provided the patient's wife with 30 day free card for Eliquis for after his rehab stay. Family is working to get medication insurance coverage under his medicare to assist with the cost of medications. CM following.  Expected Discharge Date:  07/28/17               Expected Discharge Plan:  Skilled Nursing Facility  In-House Referral:  Clinical Social Work  Discharge planning Services  CM Consult  Post Acute Care Choice:    Choice offered to:     DME Arranged:    DME Agency:     HH Arranged:    HH Agency:     Status of Service:  In process, will continue to follow  If discussed at Long Length of Stay Meetings, dates discussed:    Additional Comments:  Kermit BaloKelli F Terrill Wauters, RN 07/29/2017, 8:30 AM

## 2017-07-31 LAB — CULTURE, BLOOD (ROUTINE X 2)
CULTURE: NO GROWTH
CULTURE: NO GROWTH
Special Requests: ADEQUATE

## 2017-08-01 ENCOUNTER — Inpatient Hospital Stay (HOSPITAL_COMMUNITY)
Admission: EM | Admit: 2017-08-01 | Discharge: 2017-08-04 | DRG: 689 | Disposition: A | Discharge status: Skilled Nursing Facility | Payer: Medicare Other | Attending: Internal Medicine | Admitting: Internal Medicine

## 2017-08-01 ENCOUNTER — Encounter (HOSPITAL_COMMUNITY): Payer: Self-pay | Admitting: Emergency Medicine

## 2017-08-01 ENCOUNTER — Other Ambulatory Visit: Payer: Self-pay

## 2017-08-01 DIAGNOSIS — Z66 Do not resuscitate: Secondary | ICD-10-CM | POA: Diagnosis present

## 2017-08-01 DIAGNOSIS — E1122 Type 2 diabetes mellitus with diabetic chronic kidney disease: Secondary | ICD-10-CM | POA: Diagnosis not present

## 2017-08-01 DIAGNOSIS — B965 Pseudomonas (aeruginosa) (mallei) (pseudomallei) as the cause of diseases classified elsewhere: Secondary | ICD-10-CM | POA: Diagnosis not present

## 2017-08-01 DIAGNOSIS — Z87891 Personal history of nicotine dependence: Secondary | ICD-10-CM

## 2017-08-01 DIAGNOSIS — R338 Other retention of urine: Secondary | ICD-10-CM

## 2017-08-01 DIAGNOSIS — L299 Pruritus, unspecified: Secondary | ICD-10-CM | POA: Diagnosis not present

## 2017-08-01 DIAGNOSIS — I482 Chronic atrial fibrillation: Secondary | ICD-10-CM | POA: Diagnosis present

## 2017-08-01 DIAGNOSIS — I10 Essential (primary) hypertension: Secondary | ICD-10-CM | POA: Diagnosis not present

## 2017-08-01 DIAGNOSIS — Z79899 Other long term (current) drug therapy: Secondary | ICD-10-CM | POA: Diagnosis not present

## 2017-08-01 DIAGNOSIS — R740 Nonspecific elevation of levels of transaminase and lactic acid dehydrogenase [LDH]: Secondary | ICD-10-CM | POA: Diagnosis not present

## 2017-08-01 DIAGNOSIS — Z515 Encounter for palliative care: Secondary | ICD-10-CM | POA: Diagnosis present

## 2017-08-01 DIAGNOSIS — E785 Hyperlipidemia, unspecified: Secondary | ICD-10-CM | POA: Diagnosis not present

## 2017-08-01 DIAGNOSIS — M2550 Pain in unspecified joint: Secondary | ICD-10-CM | POA: Diagnosis not present

## 2017-08-01 DIAGNOSIS — M6281 Muscle weakness (generalized): Secondary | ICD-10-CM | POA: Diagnosis not present

## 2017-08-01 DIAGNOSIS — N281 Cyst of kidney, acquired: Secondary | ICD-10-CM | POA: Diagnosis present

## 2017-08-01 DIAGNOSIS — Z7901 Long term (current) use of anticoagulants: Secondary | ICD-10-CM | POA: Diagnosis not present

## 2017-08-01 DIAGNOSIS — R509 Fever, unspecified: Secondary | ICD-10-CM | POA: Diagnosis not present

## 2017-08-01 DIAGNOSIS — Z452 Encounter for adjustment and management of vascular access device: Secondary | ICD-10-CM

## 2017-08-01 DIAGNOSIS — N029 Recurrent and persistent hematuria with unspecified morphologic changes: Secondary | ICD-10-CM | POA: Diagnosis present

## 2017-08-01 DIAGNOSIS — N400 Enlarged prostate without lower urinary tract symptoms: Secondary | ICD-10-CM | POA: Diagnosis present

## 2017-08-01 DIAGNOSIS — I252 Old myocardial infarction: Secondary | ICD-10-CM

## 2017-08-01 DIAGNOSIS — I2510 Atherosclerotic heart disease of native coronary artery without angina pectoris: Secondary | ICD-10-CM | POA: Diagnosis not present

## 2017-08-01 DIAGNOSIS — I513 Intracardiac thrombosis, not elsewhere classified: Secondary | ICD-10-CM | POA: Diagnosis not present

## 2017-08-01 DIAGNOSIS — R488 Other symbolic dysfunctions: Secondary | ICD-10-CM | POA: Diagnosis not present

## 2017-08-01 DIAGNOSIS — R1312 Dysphagia, oropharyngeal phase: Secondary | ICD-10-CM | POA: Diagnosis not present

## 2017-08-01 DIAGNOSIS — I2699 Other pulmonary embolism without acute cor pulmonale: Secondary | ICD-10-CM | POA: Diagnosis present

## 2017-08-01 DIAGNOSIS — A419 Sepsis, unspecified organism: Secondary | ICD-10-CM | POA: Diagnosis not present

## 2017-08-01 DIAGNOSIS — I130 Hypertensive heart and chronic kidney disease with heart failure and stage 1 through stage 4 chronic kidney disease, or unspecified chronic kidney disease: Secondary | ICD-10-CM | POA: Diagnosis not present

## 2017-08-01 DIAGNOSIS — Z888 Allergy status to other drugs, medicaments and biological substances status: Secondary | ICD-10-CM | POA: Diagnosis not present

## 2017-08-01 DIAGNOSIS — G459 Transient cerebral ischemic attack, unspecified: Secondary | ICD-10-CM | POA: Diagnosis not present

## 2017-08-01 DIAGNOSIS — N390 Urinary tract infection, site not specified: Secondary | ICD-10-CM

## 2017-08-01 DIAGNOSIS — R7401 Elevation of levels of liver transaminase levels: Secondary | ICD-10-CM

## 2017-08-01 DIAGNOSIS — R319 Hematuria, unspecified: Secondary | ICD-10-CM

## 2017-08-01 DIAGNOSIS — R310 Gross hematuria: Secondary | ICD-10-CM | POA: Diagnosis not present

## 2017-08-01 DIAGNOSIS — Z960 Presence of urogenital implants: Secondary | ICD-10-CM

## 2017-08-01 DIAGNOSIS — Z7401 Bed confinement status: Secondary | ICD-10-CM | POA: Diagnosis not present

## 2017-08-01 DIAGNOSIS — I5030 Unspecified diastolic (congestive) heart failure: Secondary | ICD-10-CM | POA: Diagnosis not present

## 2017-08-01 DIAGNOSIS — Z8673 Personal history of transient ischemic attack (TIA), and cerebral infarction without residual deficits: Secondary | ICD-10-CM | POA: Diagnosis not present

## 2017-08-01 DIAGNOSIS — I4821 Permanent atrial fibrillation: Secondary | ICD-10-CM | POA: Diagnosis present

## 2017-08-01 DIAGNOSIS — M109 Gout, unspecified: Secondary | ICD-10-CM | POA: Diagnosis present

## 2017-08-01 DIAGNOSIS — R55 Syncope and collapse: Secondary | ICD-10-CM | POA: Diagnosis not present

## 2017-08-01 DIAGNOSIS — I82401 Acute embolism and thrombosis of unspecified deep veins of right lower extremity: Secondary | ICD-10-CM | POA: Diagnosis not present

## 2017-08-01 DIAGNOSIS — Z978 Presence of other specified devices: Secondary | ICD-10-CM

## 2017-08-01 DIAGNOSIS — R2689 Other abnormalities of gait and mobility: Secondary | ICD-10-CM | POA: Diagnosis not present

## 2017-08-01 LAB — COMPREHENSIVE METABOLIC PANEL
ALK PHOS: 186 U/L — AB (ref 38–126)
ALT: 94 U/L — ABNORMAL HIGH (ref 17–63)
ANION GAP: 8 (ref 5–15)
AST: 97 U/L — ABNORMAL HIGH (ref 15–41)
Albumin: 3 g/dL — ABNORMAL LOW (ref 3.5–5.0)
BUN: 31 mg/dL — ABNORMAL HIGH (ref 6–20)
CALCIUM: 9.9 mg/dL (ref 8.9–10.3)
CHLORIDE: 107 mmol/L (ref 101–111)
CO2: 22 mmol/L (ref 22–32)
CREATININE: 1.23 mg/dL (ref 0.61–1.24)
GFR calc non Af Amer: 50 mL/min — ABNORMAL LOW (ref >60)
GFR, EST AFRICAN AMERICAN: 58 mL/min — AB (ref >60)
Glucose, Bld: 149 mg/dL — ABNORMAL HIGH (ref 65–99)
Potassium: 3.8 mmol/L (ref 3.5–5.1)
SODIUM: 137 mmol/L (ref 135–145)
Total Bilirubin: 1.1 mg/dL (ref 0.3–1.2)
Total Protein: 7.2 g/dL (ref 6.5–8.1)

## 2017-08-01 LAB — I-STAT CG4 LACTIC ACID, ED: LACTIC ACID, VENOUS: 1.1 mmol/L (ref 0.5–1.9)

## 2017-08-01 LAB — PROTIME-INR
INR: 1.59
Prothrombin Time: 18.8 seconds — ABNORMAL HIGH (ref 11.4–15.2)

## 2017-08-01 MED ORDER — SODIUM CHLORIDE 0.9 % IV SOLN
2.0000 g | INTRAVENOUS | Status: DC
  Administered 2017-08-01: 2 g via INTRAVENOUS
  Filled 2017-08-01 (×2): qty 20

## 2017-08-01 MED ORDER — SODIUM CHLORIDE 0.9 % IV BOLUS
1000.0000 mL | Freq: Once | INTRAVENOUS | Status: AC
  Administered 2017-08-01: 1000 mL via INTRAVENOUS

## 2017-08-01 NOTE — ED Triage Notes (Signed)
Pt arriving via EMS from [facility]. Staff reports pt has been unable to urinate all day, catheter was replaced. Still no urine output. Pt recently seen for UTI. Pt also recently taken off blood thinners which staff says may have caused blood clots in his bladder. Wears 2L O2 at all times. Pt A&O per baseline

## 2017-08-01 NOTE — ED Provider Notes (Signed)
[facility]-EMERGENCY DEPT Provider Note   CSN: [id number] Arrival date & time: 08/01/17  2152     History   Chief Complaint Chief Complaint  Patient presents with  . Urinary Retention    HPI [name] is a 84 y.o. male.  HPI Patient presents to the emergency room for evaluation of possible urinary retention.  Patient was recently diagnosed with a urinary tract infection.  He also must have had hematuria because nursing notes indicate that he was taken off his blood thinners.  Patient is a resident of [facility] nursing facility.  Staff at the nursing facility noted the patient had not been able to urinate all day.  They placed a Foley catheter but there was still no urine output.  Patient complains of pain in his lower abdomen.  Here in the emergency room he also has a fever.  He denies any vomiting or diarrhea Past Medical History:  Diagnosis Date  . Atrial fibrillation (HCC)    chronic  . BPH (benign prostatic hyperplasia)   . CAD (coronary artery disease)    s/p MI, unsure when  . Chronic diastolic heart failure (HCC)   . Chronic indwelling Foley catheter   . CKD (chronic kidney disease), stage III (HCC)   . Cognitive impairment    from stroke  . 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 DOE (dyspnea on exertion)   . Gout   . HTN (hypertension)   . Hyperlipidemia     Patient Active Problem List   Diagnosis Date Noted  . Acute embolic stroke (HCC) 07/25/2017  . Atrial fibrillation (HCC) 07/25/2017  . History of cardioembolic cerebrovascular accident (CVA) 07/25/2017  . Hyperlipidemia 07/25/2017  . HTN (hypertension) 07/25/2017  . CKD (chronic kidney disease), stage III (HCC) 07/25/2017  . Chronic indwelling Foley catheter 07/25/2017  . History of BPH 07/25/2017  . Chronic diastolic heart failure (HCC) 07/25/2017  . ? Mural thrombus of left atrium 07/25/2017  . Gram-negative  bacteremia 07/25/2017  . Bilateral pulmonary embolism (HCC) 07/25/2017  . Urinary tract infection associated with indwelling urethral catheter (HCC)   . Sepsis (HCC) 07/13/2017  . Acute lower UTI 07/13/2017  . AKI (acute kidney injury) (HCC) 07/13/2017  . Renal insufficiency 02/27/2017  . Health maintenance examination 10/06/2016  . Primary hyperparathyroidism (HCC) 10/06/2016  . Sacral pressure sore 10/06/2016  . Urinary incontinence 10/06/2016  . Tinea pedis 03/26/2016  . Medicare annual wellness visit, subsequent 10/31/2014  . Advanced care planning/counseling discussion 10/31/2014  . DNR (do not resuscitate) 10/31/2014  . Dyspnea on exertion 09/03/2013  . Encounter for therapeutic drug monitoring 05/04/2013  . Gout 09/01/2012  . Lesion of skin of cheek 06/23/2012  . Cognitive impairment   . Permanent atrial fibrillation (HCC) 06/08/2010  . Long term (current) use of anticoagulants 06/08/2010  . Right bundle branch block 06/08/2010  . Aphasia, post-stroke 06/08/2010  . Aortic stenosis, mild 06/08/2010  . Chronic diastolic congestive heart failure (HCC) 06/08/2010  . Hypercholesterolemia 06/08/2010  . BPH (benign prostatic hyperplasia) 06/08/2010  . Essential hypertension 06/08/2010    Past Surgical History:  Procedure Laterality Date  . APPENDECTOMY  1994  . CARDIOVASCULAR STRESS TEST  01/11/2008   evidence of an old scar of the septum and a larger apical scar but no reversible ischemia, EF 48%  . CATARACT EXTRACTION Left 2008   [place]  . EXCISION MORTON'S NEUROMA  1995   x 2  .  HEMORRHOID SURGERY    . TONSILLECTOMY  1964  . US ECHOCARDIOGRAPHY  12/19/2007   EF 55-60%, mild aortic stenosis and mild pulmonary hypertension and mild mitral regurgitation        Home Medications    Prior to Admission medications   Medication Sig Start Date End Date Taking? Authorizing Provider  allopurinol (ZYLOPRIM) 100 MG tablet Take 1 tablet (100 mg total) by mouth daily. 02/21/17   Yes Eustaquio BoydenGutierrez, Javier, MD  apixaban (ELIQUIS) 5 MG TABS tablet Take 2 tablets (10 mg total) by mouth 2 (two) times daily. For 7 days starting 6/20 07/28/17  Yes Ghimire, Werner LeanShanker M, MD  atorvastatin (LIPITOR) 80 MG tablet Take 1 tablet (80 mg total) by mouth daily at 6 PM. 07/28/17  Yes Ghimire, Werner LeanShanker M, MD  digoxin (LANOXIN) 0.125 MG tablet Take 1 tablet (125 mcg total) by mouth every other day. 01/31/17  Yes Nahser, Deloris PingPhilip J, MD  finasteride (PROSCAR) 5 MG tablet Take 5 mg by mouth daily.   Yes [provider]  furosemide (LASIX) 40 MG tablet TAKEONE TABLETS BY MOUTH ONCE DAILY IN THE MORNING 07/28/17  Yes Ghimire, Werner LeanShanker M, MD  metoprolol succinate (TOPROL-XL) 25 MG 24 hr tablet TAKE ONE-HALF TABLET BY MOUTH ONCE DAILY Patient taking differently: Take 12.5 mg by mouth daily. TAKE ONE-HALF TABLET BY MOUTH ONCE DAILY 01/06/17  Yes Nahser, Deloris PingPhilip J, MD  nitroGLYCERIN (NITROSTAT) 0.4 MG SL tablet Place 0.4 mg under the tongue every 5 (five) minutes as needed (for chest pain).    Yes [provider]  Omega-3 Fatty Acids (FISH OIL) 1000 MG CAPS Take 1,000 mg by mouth daily.    Yes [provider]  apixaban (ELIQUIS) 5 MG TABS tablet Take 1 tablet (5 mg total) by mouth 2 (two) times daily. Starting ON 6/27 AT 10 AM Patient not taking: Reported on 08/01/2017 08/04/17   Maretta BeesGhimire, Shanker M, MD    Family History Family History  Problem Relation Age of Onset  . Cancer Brother        brain  . Cancer Sister        colon  . CAD Sister   . Alcohol abuse Brother     Social History Social History   Tobacco Use  . Smoking status: Former Smoker    Last attempt to quit: 02/08/1950    Years since quitting: 67.5  . Smokeless tobacco: Never Used  Substance Use Topics  . Alcohol use: No  . Drug use: No     Allergies   Benazepril hcl   Review of Systems Review of Systems  All other systems reviewed and are negative.    Physical Exam Updated Vital Signs BP (!) 141/69  (BP Location: Left Arm)   Pulse 80   Temp (!) 101.2 F (38.4 C) (Rectal) Comment: RN notified  Resp (!) 25   Ht 1.829 m (6')   Wt 99.8 kg (220 lb)   SpO2 95%   BMI 29.84 kg/m   Physical Exam  HENT:  Head: Normocephalic and atraumatic.  Right Ear: External ear normal.  Left Ear: External ear normal.  Eyes: Conjunctivae are normal. Right eye exhibits no discharge. Left eye exhibits no discharge. No scleral icterus.  Neck: Neck supple. No tracheal deviation present.  Cardiovascular: Normal rate and intact distal pulses. An irregularly irregular rhythm present.  Pulmonary/Chest: Effort normal and breath sounds normal. No stridor. No respiratory distress. He has no wheezes. He has no rales.  Abdominal: Soft. Bowel sounds are normal.  He exhibits mass. He exhibits no distension. There is tenderness. There is no rebound and no guarding.  Firm in the lower abdomen, tenderness palpation  Musculoskeletal: He exhibits no edema or tenderness.  Neurological: He is alert. He has normal strength. No cranial nerve deficit (no facial droop, extraocular movements intact, no slurred speech) or sensory deficit. He exhibits normal muscle tone. He displays no seizure activity. Coordination normal.  Skin: Skin is warm and dry. No rash noted. He is not diaphoretic.  Psychiatric: He has a normal mood and affect.  Nursing note and vitals reviewed.    ED Treatments / Results  Labs (all labs ordered are listed, but only abnormal results are displayed) Labs Reviewed  COMPREHENSIVE METABOLIC PANEL - Abnormal; Notable for the following components:      Result Value   Glucose, Bld 149 (*)    BUN 31 (*)    Albumin 3.0 (*)    AST 97 (*)    ALT 94 (*)    Alkaline Phosphatase 186 (*)    GFR calc non Af Amer 50 (*)    GFR calc Af Amer 58 (*)    All other components within normal limits  CBC WITH DIFFERENTIAL/PLATELET - Abnormal; Notable for the following components:   WBC 10.6 (*)    RBC 3.96 (*)     Hemoglobin 12.8 (*)    HCT 38.6 (*)    Neutro Abs 8.4 (*)    Monocytes Absolute 1.4 (*)    All other components within normal limits  PROTIME-INR - Abnormal; Notable for the following components:   Prothrombin Time 18.8 (*)    All other components within normal limits  CULTURE, BLOOD (ROUTINE X 2)  CULTURE, BLOOD (ROUTINE X 2)  URINALYSIS, ROUTINE W REFLEX MICROSCOPIC  I-STAT CG4 LACTIC ACID, ED  I-STAT CG4 LACTIC ACID, ED    EKG EKG Interpretation  Date/Time:  Monday August 01 2017 22:46:21 EDT Ventricular Rate:  96 PR Interval:    QRS Duration: 142 QT Interval:  374 QTC Calculation: 473 R Axis:   -73 Text Interpretation:  Atrial fibrillation RBBB and LAFB No significant change since last tracing Confirmed by Linwood Dibbles [phone]) on 08/01/2017 10:49:39 PM   Radiology No results found.  Procedures .Critical Care Performed by: Linwood Dibbles, MD Authorized by: Linwood Dibbles, MD   Critical care provider statement:    Critical care time (minutes):  35   Critical care was time spent personally by me on the following activities:  Discussions with consultants, evaluation of patient's response to treatment, examination of patient, ordering and performing treatments and interventions, ordering and review of laboratory studies, ordering and review of radiographic studies, pulse oximetry, re-evaluation of patient's condition, obtaining history from patient or surrogate and review of old charts   (including critical care time)  Medications Ordered in ED Medications  cefTRIAXone (ROCEPHIN) 2 g in sodium chloride 0.9 % 100 mL IVPB (2 g Intravenous New Bag/Given 08/01/17 2312)  sodium chloride 0.9 % bolus 1,000 mL (1,000 mLs Intravenous New Bag/Given 08/01/17 2307)     Initial Impression / Assessment and Plan / ED Course  I have reviewed the triage vital signs and the nursing notes.  Pertinent labs & imaging results that were available during my care of the patient were reviewed by me and  considered in my medical decision making (see chart for details).   Patient presented to the emergency room for evaluation of fever and urinary retention.  Patient's vital signs have remained stable.  No evidence of hypotension.  Lactic acid levels not elevated.  I doubt severe sepsis.  Slight increase in lfts without elevated bilirubin.  Recent abd ct scan in the last week without acute abnormality.   Pt was started on IV abx, fluid resuscitation.   Will continue to monitor.  Admit to the medical service for further treatment.  Final Clinical Impressions(s) / ED Diagnoses   Final diagnoses:  Acute urinary retention  Urinary tract infection with hematuria, site unspecified     Linwood Dibbles, MD 08/02/17 [phone]

## 2017-08-01 NOTE — ED Notes (Signed)
Bed: WA14 Expected date:  Expected time:  Means of arrival:  Comments: EMS 

## 2017-08-02 ENCOUNTER — Inpatient Hospital Stay (HOSPITAL_COMMUNITY): Payer: Medicare Other

## 2017-08-02 ENCOUNTER — Emergency Department (HOSPITAL_COMMUNITY): Payer: Medicare Other

## 2017-08-02 DIAGNOSIS — Z960 Presence of urogenital implants: Secondary | ICD-10-CM

## 2017-08-02 DIAGNOSIS — R319 Hematuria, unspecified: Secondary | ICD-10-CM

## 2017-08-02 DIAGNOSIS — Z79899 Other long term (current) drug therapy: Secondary | ICD-10-CM | POA: Diagnosis not present

## 2017-08-02 DIAGNOSIS — E785 Hyperlipidemia, unspecified: Secondary | ICD-10-CM | POA: Diagnosis present

## 2017-08-02 DIAGNOSIS — Z515 Encounter for palliative care: Secondary | ICD-10-CM | POA: Diagnosis present

## 2017-08-02 DIAGNOSIS — N390 Urinary tract infection, site not specified: Secondary | ICD-10-CM | POA: Diagnosis not present

## 2017-08-02 DIAGNOSIS — M109 Gout, unspecified: Secondary | ICD-10-CM | POA: Diagnosis present

## 2017-08-02 DIAGNOSIS — R338 Other retention of urine: Secondary | ICD-10-CM

## 2017-08-02 DIAGNOSIS — R7401 Elevation of levels of liver transaminase levels: Secondary | ICD-10-CM | POA: Diagnosis present

## 2017-08-02 DIAGNOSIS — I2699 Other pulmonary embolism without acute cor pulmonale: Secondary | ICD-10-CM

## 2017-08-02 DIAGNOSIS — I252 Old myocardial infarction: Secondary | ICD-10-CM | POA: Diagnosis not present

## 2017-08-02 DIAGNOSIS — Z888 Allergy status to other drugs, medicaments and biological substances status: Secondary | ICD-10-CM | POA: Diagnosis not present

## 2017-08-02 DIAGNOSIS — Z7901 Long term (current) use of anticoagulants: Secondary | ICD-10-CM | POA: Diagnosis not present

## 2017-08-02 DIAGNOSIS — E1122 Type 2 diabetes mellitus with diabetic chronic kidney disease: Secondary | ICD-10-CM | POA: Diagnosis present

## 2017-08-02 DIAGNOSIS — I513 Intracardiac thrombosis, not elsewhere classified: Secondary | ICD-10-CM

## 2017-08-02 DIAGNOSIS — Z66 Do not resuscitate: Secondary | ICD-10-CM | POA: Diagnosis present

## 2017-08-02 DIAGNOSIS — Z452 Encounter for adjustment and management of vascular access device: Secondary | ICD-10-CM | POA: Diagnosis not present

## 2017-08-02 DIAGNOSIS — Z8673 Personal history of transient ischemic attack (TIA), and cerebral infarction without residual deficits: Secondary | ICD-10-CM | POA: Diagnosis not present

## 2017-08-02 DIAGNOSIS — I482 Chronic atrial fibrillation: Secondary | ICD-10-CM | POA: Diagnosis present

## 2017-08-02 DIAGNOSIS — R509 Fever, unspecified: Secondary | ICD-10-CM | POA: Diagnosis not present

## 2017-08-02 DIAGNOSIS — M2550 Pain in unspecified joint: Secondary | ICD-10-CM | POA: Diagnosis not present

## 2017-08-02 DIAGNOSIS — Z7401 Bed confinement status: Secondary | ICD-10-CM | POA: Diagnosis not present

## 2017-08-02 DIAGNOSIS — N400 Enlarged prostate without lower urinary tract symptoms: Secondary | ICD-10-CM | POA: Diagnosis present

## 2017-08-02 DIAGNOSIS — I130 Hypertensive heart and chronic kidney disease with heart failure and stage 1 through stage 4 chronic kidney disease, or unspecified chronic kidney disease: Secondary | ICD-10-CM | POA: Diagnosis present

## 2017-08-02 DIAGNOSIS — R740 Nonspecific elevation of levels of transaminase and lactic acid dehydrogenase [LDH]: Secondary | ICD-10-CM

## 2017-08-02 DIAGNOSIS — Z87891 Personal history of nicotine dependence: Secondary | ICD-10-CM | POA: Diagnosis not present

## 2017-08-02 DIAGNOSIS — N281 Cyst of kidney, acquired: Secondary | ICD-10-CM | POA: Diagnosis present

## 2017-08-02 DIAGNOSIS — A419 Sepsis, unspecified organism: Secondary | ICD-10-CM | POA: Diagnosis not present

## 2017-08-02 DIAGNOSIS — I2510 Atherosclerotic heart disease of native coronary artery without angina pectoris: Secondary | ICD-10-CM | POA: Diagnosis present

## 2017-08-02 DIAGNOSIS — R310 Gross hematuria: Secondary | ICD-10-CM | POA: Diagnosis not present

## 2017-08-02 DIAGNOSIS — N029 Recurrent and persistent hematuria with unspecified morphologic changes: Secondary | ICD-10-CM | POA: Diagnosis present

## 2017-08-02 LAB — CBC WITH DIFFERENTIAL/PLATELET
BASOS ABS: 0 10*3/uL (ref 0.0–0.1)
Basophils Relative: 0 %
EOS PCT: 1 %
Eosinophils Absolute: 0.1 10*3/uL (ref 0.0–0.7)
HEMATOCRIT: 38.6 % — AB (ref 39.0–52.0)
Hemoglobin: 12.8 g/dL — ABNORMAL LOW (ref 13.0–17.0)
LYMPHS ABS: 0.8 10*3/uL (ref 0.7–4.0)
LYMPHS PCT: 7 %
MCH: 32.3 pg (ref 26.0–34.0)
MCHC: 33.2 g/dL (ref 30.0–36.0)
MCV: 97.5 fL (ref 78.0–100.0)
MONO ABS: 1.4 10*3/uL — AB (ref 0.1–1.0)
Monocytes Relative: 13 %
NEUTROS ABS: 8.4 10*3/uL — AB (ref 1.7–7.7)
Neutrophils Relative %: 79 %
Platelets: 297 10*3/uL (ref 150–400)
RBC: 3.96 MIL/uL — ABNORMAL LOW (ref 4.22–5.81)
RDW: 14.1 % (ref 11.5–15.5)
WBC: 10.6 10*3/uL — ABNORMAL HIGH (ref 4.0–10.5)

## 2017-08-02 LAB — COMPREHENSIVE METABOLIC PANEL
ALK PHOS: 160 U/L — AB (ref 38–126)
ALT: 79 U/L — ABNORMAL HIGH (ref 0–44)
ANION GAP: 7 (ref 5–15)
AST: 69 U/L — ABNORMAL HIGH (ref 15–41)
Albumin: 2.7 g/dL — ABNORMAL LOW (ref 3.5–5.0)
BILIRUBIN TOTAL: 0.6 mg/dL (ref 0.3–1.2)
BUN: 28 mg/dL — AB (ref 8–23)
CO2: 25 mmol/L (ref 22–32)
Calcium: 9.7 mg/dL (ref 8.9–10.3)
Chloride: 108 mmol/L (ref 98–111)
Creatinine, Ser: 1.14 mg/dL (ref 0.61–1.24)
GFR calc Af Amer: >60 mL/min (ref >60)
GFR calc non Af Amer: 55 mL/min — ABNORMAL LOW (ref >60)
Glucose, Bld: 115 mg/dL — ABNORMAL HIGH (ref 70–99)
POTASSIUM: 4.2 mmol/L (ref 3.5–5.1)
Sodium: 140 mmol/L (ref 135–145)
TOTAL PROTEIN: 6.8 g/dL (ref 6.5–8.1)

## 2017-08-02 LAB — URINALYSIS, ROUTINE W REFLEX MICROSCOPIC
Bilirubin Urine: NEGATIVE
GLUCOSE, UA: NEGATIVE mg/dL
Ketones, ur: NEGATIVE mg/dL
Nitrite: NEGATIVE
Protein, ur: 30 mg/dL — AB
RBC / HPF: >50 RBC/hpf — ABNORMAL HIGH (ref 0–5)
SPECIFIC GRAVITY, URINE: 1.014 (ref 1.005–1.030)
pH: 6 (ref 5.0–8.0)

## 2017-08-02 LAB — CBC
HCT: 35.3 % — ABNORMAL LOW (ref 39.0–52.0)
Hemoglobin: 11.9 g/dL — ABNORMAL LOW (ref 13.0–17.0)
MCH: 33.1 pg (ref 26.0–34.0)
MCHC: 33.7 g/dL (ref 30.0–36.0)
MCV: 98.3 fL (ref 78.0–100.0)
PLATELETS: 283 10*3/uL (ref 150–400)
RBC: 3.59 MIL/uL — AB (ref 4.22–5.81)
RDW: 14.3 % (ref 11.5–15.5)
WBC: 10.7 10*3/uL — ABNORMAL HIGH (ref 4.0–10.5)

## 2017-08-02 LAB — I-STAT CG4 LACTIC ACID, ED: LACTIC ACID, VENOUS: 0.75 mmol/L (ref 0.5–1.9)

## 2017-08-02 MED ORDER — ONDANSETRON HCL 4 MG/2ML IJ SOLN
4.0000 mg | Freq: Four times a day (QID) | INTRAMUSCULAR | Status: DC | PRN
  Administered 2017-08-02: 4 mg via INTRAVENOUS
  Filled 2017-08-02: qty 2

## 2017-08-02 MED ORDER — FINASTERIDE 5 MG PO TABS
5.0000 mg | ORAL_TABLET | Freq: Every day | ORAL | Status: DC
  Administered 2017-08-02 – 2017-08-04 (×3): 5 mg via ORAL
  Filled 2017-08-02 (×3): qty 1

## 2017-08-02 MED ORDER — ALLOPURINOL 100 MG PO TABS
100.0000 mg | ORAL_TABLET | Freq: Every day | ORAL | Status: DC
  Administered 2017-08-02 – 2017-08-04 (×3): 100 mg via ORAL
  Filled 2017-08-02 (×3): qty 1

## 2017-08-02 MED ORDER — SODIUM CHLORIDE 0.9 % IV SOLN
2.0000 g | Freq: Once | INTRAVENOUS | Status: AC
  Administered 2017-08-02: 2 g via INTRAVENOUS
  Filled 2017-08-02: qty 2

## 2017-08-02 MED ORDER — ONDANSETRON HCL 4 MG PO TABS: 4.0000 mg | ORAL_TABLET | Freq: Four times a day (QID) | ORAL | Status: DC | PRN

## 2017-08-02 MED ORDER — ACETAMINOPHEN 650 MG RE SUPP: 650.0000 mg | Freq: Four times a day (QID) | RECTAL | Status: DC | PRN

## 2017-08-02 MED ORDER — ATORVASTATIN CALCIUM 40 MG PO TABS
80.0000 mg | ORAL_TABLET | Freq: Every day | ORAL | Status: DC
  Administered 2017-08-02 – 2017-08-03 (×2): 80 mg via ORAL
  Filled 2017-08-02 (×2): qty 2

## 2017-08-02 MED ORDER — DIGOXIN 125 MCG PO TABS
125.0000 ug | ORAL_TABLET | ORAL | Status: DC
  Administered 2017-08-02 – 2017-08-04 (×2): 125 ug via ORAL
  Filled 2017-08-02 (×2): qty 1

## 2017-08-02 MED ORDER — APIXABAN 5 MG PO TABS
5.0000 mg | ORAL_TABLET | Freq: Two times a day (BID) | ORAL | Status: DC
  Administered 2017-08-04: 5 mg via ORAL
  Filled 2017-08-02: qty 1

## 2017-08-02 MED ORDER — SODIUM CHLORIDE 0.9 % IV SOLN: 2.0000 g | INTRAVENOUS | Status: DC

## 2017-08-02 MED ORDER — ACETAMINOPHEN 325 MG PO TABS: 650.0000 mg | ORAL_TABLET | Freq: Four times a day (QID) | ORAL | Status: DC | PRN

## 2017-08-02 MED ORDER — SODIUM CHLORIDE 0.9 % IV SOLN
2.0000 g | Freq: Two times a day (BID) | INTRAVENOUS | Status: DC
  Administered 2017-08-02 – 2017-08-04 (×5): 2 g via INTRAVENOUS
  Filled 2017-08-02 (×6): qty 2

## 2017-08-02 MED ORDER — APIXABAN 5 MG PO TABS
10.0000 mg | ORAL_TABLET | Freq: Two times a day (BID) | ORAL | Status: AC
  Administered 2017-08-02 – 2017-08-03 (×4): 10 mg via ORAL
  Filled 2017-08-02 (×4): qty 2

## 2017-08-02 MED ORDER — METOPROLOL SUCCINATE ER 25 MG PO TB24
12.5000 mg | ORAL_TABLET | Freq: Every day | ORAL | Status: DC
  Administered 2017-08-02 – 2017-08-04 (×3): 12.5 mg via ORAL
  Filled 2017-08-02 (×3): qty 1

## 2017-08-02 NOTE — Progress Notes (Signed)
Pharmacy Antibiotic Note  [name] is a 84 y.o. male admitted on 08/01/2017 with recurrent UTI, urinary retention with foley catheter. Pharmacy has been consulted for cefepime dosing for hx of recent MDRO pseudomonas UTI.  UA with rare bacteria.   SCr improved to 1.14 Tm 101.2 WBC remains elevated at 10.7 Cxt pending  Plan: Increase to Cefepime 2 gm IV q12 F/u renal function and culture data  Height: 6' (182.9 cm) Weight: 220 lb (99.8 kg) IBW/kg (Calculated) : 77.6  Temp (24hrs), Avg:98.7 F (37.1 C), Min:97.9 F (36.6 C), Max:101.2 F (38.4 C)  Recent Labs  Lab 07/27/17 0647 07/28/17 0502 07/29/17 0353 08/01/17 2230 08/01/17 2236 08/02/17 0033 08/02/17 0710  WBC 13.0* 13.1* 13.7* 10.6*  --   --  10.7*  CREATININE 1.51* 1.42*  --  1.23  --   --  1.14  LATICACIDVEN  --   --   --   --  1.10 0.75  --     Estimated Creatinine Clearance: 53.7 mL/min (by C-G formula based on SCr of 1.14 mg/dL).    Allergies  Allergen Reactions  . Benazepril Hcl Other (See Comments)    Lethargic, nervous, nauseated    Antimicrobials this admission:  6/25 cefepime >>  Dose adjustments this admission:  Microbiology results: 6/24 BCx2>> 6/25 Ucx >> 6/17 Ucx> 40 K pseudomonas R cipro, gent,impenem, S to cefepime,ceftazidime, pip/tazo  Thank you for allowing pharmacy to be a part of this patient's care.  Lynann Beaverhristine Chimere Klingensmith PharmD, BCPS Pager [phone] 08/02/2017 11:13 AM

## 2017-08-02 NOTE — Progress Notes (Signed)
Patient ID: [name], male   DOB: 04/28/1925, 84 y.o.   MRN: [id number] Patient was admitted early this morning for fever and urinary retention and started on broad-spectrum antibiotics.  Patient also had hematuria/blood clots in the Foley catheter and Foley catheter was replaced in the ED on admission.  Patient seen and examined at bedside and plan of care discussed with multiple family members present in the room including [name] and son.  I have reviewed this morning's H&P and recent medical records myself.  Family requested urology evaluation as well.  I have spoken to Dr. Manny/urology about the patient and urology will see the patient in consultation.  Family requested that patient's CODE STATUS is DNR.  I have changed his CODE STATUS to DNR.  I have also requested palliative care consult for goals of care given his recurrent hospitalizations and overall poor prognosis.  Repeat a.m. labs.

## 2017-08-02 NOTE — Progress Notes (Signed)
Pharmacy Antibiotic Note  [name] is a 84 y.o. male admitted on 08/01/2017 with UTI.  Pharmacy has been consulted for cefepime dosing for " UTI - had MDRO pseudomonas in urine last week".  UA with rare bacteria.   Plan: Cefepime 2 gm IV q24 F/u renal function and culture data  Height: 6' (182.9 cm) Weight: 220 lb (99.8 kg) IBW/kg (Calculated) : 77.6  Temp (24hrs), Avg:99.8 F (37.7 C), Min:98.3 F (36.8 C), Max:101.2 F (38.4 C)  Recent Labs  Lab 07/27/17 0647 07/28/17 0502 07/29/17 0353 08/01/17 2230 08/01/17 2236 08/02/17 0033  WBC 13.0* 13.1* 13.7* 10.6*  --   --   CREATININE 1.51* 1.42*  --  1.23  --   --   LATICACIDVEN  --   --   --   --  1.10 0.75    Estimated Creatinine Clearance: 49.8 mL/min (by C-G formula based on SCr of 1.23 mg/dL).    Allergies  Allergen Reactions  . Benazepril Hcl Other (See Comments)    Lethargic, nervous, nauseated    Antimicrobials this admission:  6/25 cefepime >>  Dose adjustments this admission:  Microbiology results: 6/24 BCx2>> 6/25 Ucx >> 6/17 Ucx> 40 K pseudomonas R cipro, gent,impenem, S to cefepime,ceftazidime, pip/tazo  Thank you for allowing pharmacy to be a part of this patient's care.  Herby AbrahamMichelle T. Reiner Loewen, Pharm.D. [phone] 08/02/2017 1:38 AM

## 2017-08-02 NOTE — Progress Notes (Signed)
Pt admitted from [facility]- had been there for 3 days for short term rehab following DC from [facility] 07/29/17. Has dementia, lives with wife who assists him in completing ADLs, and at baseline pt uses walker to ambulate and sometimes needs assistance to transfer but can sometimes accomplish this independently. Met with pt, wife, son, and niece at bedside today- Informed them CSW will follow during hospitalization for needs and assist with care needs at DC, potentially returning to [facility] to complete rehab if desired and appropriate. Family consented to have CSW keep [facility] updated as well- spoke with facility who is aware of pt's admission status. Will follow. See complete assessment 07/28/17 during previous admission.  Sharren Bridge, MSW, LCSW Clinical Social Work 08/02/2017 [phone]    Clinical Social Work Assessment  Patient Details  Name: [name] MRN: [id number] Date of Birth: 04/30/1925  Date of referral:  07/28/17               Reason for consult:  Facility Placement                 Permission sought to share information with:  Chartered certified accountant granted to share information::  Yes, Verbal Permission Granted             Name::     Karrie Meres             Agency::  [facility]             Relationship::  Spouse, Niece             Contact Information:     Housing/Transportation Living arrangements for the past 2 months:  Single Family Home Source of Information:  Spouse, Other (Comment Required) Patient Interpreter Needed:  None Criminal Activity/Legal Involvement Pertinent to Current Situation/Hospitalization:  No - Comment as needed Significant Relationships:  Other Family Members, Adult Children, Spouse Lives with:  Self, Spouse Do you feel safe going back to the place where you live?  Yes Need for family participation in patient care:  Yes (Comment)  Care giving concerns:  Patient from home with spouse but will need short term rehab  at discharge.   Social Worker assessment / plan:  CSW met with patient's spouse and niece at bedside to discuss recommendation for SNF. CSW discussed facility options and obtained preference. CSW obtained permission to fax out referral and follow up with [facility] for placement. CSW to fax out insurance authorization request.  Employment status:  Retired Nurse, adult PT Recommendations:  [place] / Referral to community resources:  [place]  Patient/Family's Response to care:  Patient's family agreeable to SNF placement.  Patient/Family's Understanding of and Emotional Response to Diagnosis, Current Treatment, and Prognosis:  Patient's family discussed how they were not happy with the patient needing SNF at discharge because they've heard mixed reviews about some of the facilities, but that they knew it would be the best idea for him. Patient's family hopeful that patient will recover and be able to go home soon.  Emotional Assessment Appearance:  Appears stated age Attitude/Demeanor/Rapport:  Unable to Assess Affect (typically observed):  Unable to Assess Orientation:  Oriented to Self Alcohol / Substance use:  Not Applicable Psych involvement (Current and /or in the community):  No (Comment)  Discharge Needs  Concerns to be addressed:  Care Coordination Readmission within the last 30 days:  Yes Current discharge risk:  Dependent with Mobility Barriers to Discharge:  Continued Medical Work up, [place], [place] 07/28/2017, 3:36 PM

## 2017-08-02 NOTE — Consult Note (Signed)
Reason for Consult: Gross Hematuria, Recurrent Urinary Infections, Urinary Retention, Bilateral Renal Cysts  Referring Physician: Aline August MD  [name] is an 84 y.o. male.   HPI:   1 - Gross Hematuria - on / off gross hematuria x years from prostate source. Prior CT and cysto w/o neoplasia. He is on coumadin for cardiac indication. On finasteride at baseline.   2 - Recurrent Urinary Infections - multiple admissions for complex UTI with fevers, mental status changes. Most recetn UCX 6/17 psudomonas REC cipro / gent SENS to cefipime, ceftaz, zosyn, and placed on cefipime. Imaging x several 2019 w/o stones or hydro. He is diabetic.   3 - Urinary Retention - long h/o med-refracotry retetion with prostate level obstruction proven by prior cysto and Urodynamics. Not surgical canddiate. Manages with 57F foley changed at Urol office or by home health.   4 - Bilateral Renal Cysts - bilateral <5cm renal cysts on contrast imaging x several. No mass effect or large enhancidng nodules.   Today [name] is seen in consultation for above. He is admitted again with compels GU infection. He continues to have functional decline in / out of hospital. Becoming much harder for wife to care for at home.   Past Medical History:  Diagnosis Date  . Atrial fibrillation (HCC)    chronic  . BPH (benign prostatic hyperplasia)   . CAD (coronary artery disease)    s/p MI, unsure when  . Chronic diastolic heart failure ([place])   . Chronic indwelling Foley catheter   . CKD (chronic kidney disease), stage III ([place])   . Cognitive impairment    from stroke  . CVA (cerebrovascular accident) ([place]) x4 (latest 4944)   embolic, residual cognitive impairment  . DOE (dyspnea on exertion)   . Gout   . HTN (hypertension)   . Hyperlipidemia     Past Surgical History:  Procedure Laterality Date  . APPENDECTOMY  1994  . CARDIOVASCULAR STRESS TEST  01/11/2008   evidence of an old scar of the septum and a larger  apical scar but no reversible ischemia, EF 48%  . CATARACT EXTRACTION Left 2008   [place]  . [place]   x 2  . HEMORRHOID SURGERY    . TONSILLECTOMY  1964  . US ECHOCARDIOGRAPHY  12/19/2007   EF 55-60%, mild aortic stenosis and mild pulmonary hypertension and mild mitral regurgitation    Family History  Problem Relation Age of Onset  . Cancer Brother        brain  . Cancer Sister        colon  . CAD Sister   . Alcohol abuse Brother     Social History:  reports that he quit smoking about 67 years ago. He has never used smokeless tobacco. He reports that he does not drink alcohol or use drugs.  Allergies:  Allergies  Allergen Reactions  . Benazepril Hcl Other (See Comments)    Lethargic, nervous, nauseated    Medications: I have reviewed the patient's current medications.  Results for orders placed or performed during the hospital encounter of 08/01/17 (from the past 48 hour(s))  Urinalysis, Routine w reflex microscopic     Status: Abnormal   Collection Time: 08/01/17 10:12 PM  Result Value Ref Range   Color, Urine YELLOW YELLOW   APPearance HAZY (A) CLEAR   Specific Gravity, Urine 1.014 1.005 - 1.030   pH 6.0 5.0 - 8.0   Glucose, UA NEGATIVE NEGATIVE mg/dL  Hgb urine dipstick LARGE (A) NEGATIVE   Bilirubin Urine NEGATIVE NEGATIVE   Ketones, ur NEGATIVE NEGATIVE mg/dL   Protein, ur 30 (A) NEGATIVE mg/dL   Nitrite NEGATIVE NEGATIVE   Leukocytes, UA SMALL (A) NEGATIVE   RBC / HPF >50 (H) 0 - 5 RBC/hpf   WBC, UA 6-10 0 - 5 WBC/hpf   Bacteria, UA RARE (A) NONE SEEN    Comment: Performed at [facility], [place] [address] [place], [place] [address]  Comprehensive metabolic panel     Status: Abnormal   Collection Time: 08/01/17 10:30 PM  Result Value Ref Range   Sodium 137 135 - 145 mmol/L   Potassium 3.8 3.5 - 5.1 mmol/L   Chloride 107 101 - 111 mmol/L   CO2 22 22 - 32 mmol/L   Glucose, Bld 149 (H) 65 - 99 mg/dL   BUN 31 (H) 6  - 20 mg/dL   Creatinine, Ser 1.23 0.61 - 1.24 mg/dL   Calcium 9.9 8.9 - 10.3 mg/dL   Total Protein 7.2 6.5 - 8.1 g/dL   Albumin 3.0 (L) 3.5 - 5.0 g/dL   AST 97 (H) 15 - 41 U/L   ALT 94 (H) 17 - 63 U/L   Alkaline Phosphatase 186 (H) 38 - 126 U/L   Total Bilirubin 1.1 0.3 - 1.2 mg/dL   GFR calc non Af Amer 50 (L) >60 mL/min   GFR calc Af Amer 58 (L) >60 mL/min    Comment: (NOTE) The eGFR has been calculated using the CKD EPI equation. This calculation has not been validated in all clinical situations. eGFR's persistently <60 mL/min signify possible Chronic Kidney Disease.    Anion gap 8 5 - 15    Comment: Performed at [facility], [place] [address], [place], [place] [address]  CBC WITH DIFFERENTIAL     Status: Abnormal   Collection Time: 08/01/17 10:30 PM  Result Value Ref Range   WBC 10.6 (H) 4.0 - 10.5 K/uL   RBC 3.96 (L) 4.22 - 5.81 MIL/uL   Hemoglobin 12.8 (L) 13.0 - 17.0 g/dL   HCT 38.6 (L) 39.0 - 52.0 %   MCV 97.5 78.0 - 100.0 fL   MCH 32.3 26.0 - 34.0 pg   MCHC 33.2 30.0 - 36.0 g/dL   RDW 14.1 11.5 - 15.5 %   Platelets 297 150 - 400 K/uL   Neutrophils Relative % 79 %   Neutro Abs 8.4 (H) 1.7 - 7.7 K/uL   Lymphocytes Relative 7 %   Lymphs Abs 0.8 0.7 - 4.0 K/uL   Monocytes Relative 13 %   Monocytes Absolute 1.4 (H) 0.1 - 1.0 K/uL   Eosinophils Relative 1 %   Eosinophils Absolute 0.1 0.0 - 0.7 K/uL   Basophils Relative 0 %   Basophils Absolute 0.0 0.0 - 0.1 K/uL    Comment: Performed at [facility], [place] [address] [place], [place] [address]  I-Stat CG4 Lactic Acid, ED  (not at  [facility])     Status: None   Collection Time: 08/01/17 10:36 PM  Result Value Ref Range   Lactic Acid, Venous 1.10 0.5 - 1.9 mmol/L  Protime-INR     Status: Abnormal   Collection Time: 08/01/17 11:04 PM  Result Value Ref Range   Prothrombin Time 18.8 (H) 11.4 - 15.2 seconds   INR 1.59     Comment: Performed at [facility], [place]  [address] [place], [place] [address]  I-Stat CG4 Lactic  Acid, ED  (not at  [facility])     Status: None   Collection Time: 08/02/17 12:33 AM  Result Value Ref Range   Lactic Acid, Venous 0.75 0.5 - 1.9 mmol/L  CBC     Status: Abnormal   Collection Time: 08/02/17  7:10 AM  Result Value Ref Range   WBC 10.7 (H) 4.0 - 10.5 K/uL   RBC 3.59 (L) 4.22 - 5.81 MIL/uL   Hemoglobin 11.9 (L) 13.0 - 17.0 g/dL   HCT 35.3 (L) 39.0 - 52.0 %   MCV 98.3 78.0 - 100.0 fL   MCH 33.1 26.0 - 34.0 pg   MCHC 33.7 30.0 - 36.0 g/dL   RDW 14.3 11.5 - 15.5 %   Platelets 283 150 - 400 K/uL    Comment: Performed at [facility], [place] [address] [place], [place] [address]  Comprehensive metabolic panel     Status: Abnormal   Collection Time: 08/02/17  7:10 AM  Result Value Ref Range   Sodium 140 135 - 145 mmol/L   Potassium 4.2 3.5 - 5.1 mmol/L   Chloride 108 98 - 111 mmol/L   CO2 25 22 - 32 mmol/L   Glucose, Bld 115 (H) 70 - 99 mg/dL   BUN 28 (H) 8 - 23 mg/dL   Creatinine, Ser 1.14 0.61 - 1.24 mg/dL   Calcium 9.7 8.9 - 10.3 mg/dL   Total Protein 6.8 6.5 - 8.1 g/dL   Albumin 2.7 (L) 3.5 - 5.0 g/dL   AST 69 (H) 15 - 41 U/L   ALT 79 (H) 0 - 44 U/L   Alkaline Phosphatase 160 (H) 38 - 126 U/L   Total Bilirubin 0.6 0.3 - 1.2 mg/dL   GFR calc non Af Amer 55 (L) >60 mL/min   GFR calc Af Amer >60 >60 mL/min    Comment: (NOTE) The eGFR has been calculated using the CKD EPI equation. This calculation has not been validated in all clinical situations. eGFR's persistently <60 mL/min signify possible Chronic Kidney Disease.    Anion gap 7 5 - 15    Comment: Performed at [facility], [place] [address] [place], [place] [address]    US Abdomen Complete  Result Date: 08/02/2017 CLINICAL DATA:  Transaminitis and sepsis. EXAM: ABDOMEN ULTRASOUND COMPLETE COMPARISON:  CT 07/25/2017 FINDINGS: Gallbladder: No gallstones or wall thickening visualized. No sonographic Murphy sign noted by  sonographer. Common bile duct: Diameter: 3.6 mm and normal. Liver: No focal lesion identified. Within normal limits in parenchymal echogenicity. No intrahepatic ductal dilatation. Portal vein is patent on color Doppler imaging with normal direction of blood flow towards the liver. IVC: No abnormality visualized. Pancreas: Visualized portion unremarkable. Spleen: Size and appearance within normal limits. Right Kidney: Length: 12.3 cm. Echogenicity within normal limits. Exophytic cyst arising from the interpolar and lower pole of the right kidney measuring 5.7 x 2.4 x 3.3 cm and 4.3 x 2.3 x 3.6 cm. These contain low-level internal echoes. No focal mural nodules. No septations. Left Kidney: Length: 10.7 cm. Echogenicity within normal limits. No mass or hydronephrosis visualized. Small cysts measuring 1.8 x 1.4 x 1 cm in the upper pole and 1.4 x 1.4 x 1.2 cm are noted simple in appearance. Abdominal aorta: No aneurysm visualized. Limited assessment due to overlying bowel. Other findings: Urinary bladder is decompressed by Foley catheter. IMPRESSION: 1. Unremarkable appearance of the liver and gallbladder. No findings for the patient's transaminitis. 2. Decompressed urinary bladder with Foley catheter. Bilateral renal  cysts. Electronically Signed   By: Ashley Royalty M.D.   On: 08/02/2017 03:34   Dg Chest Portable 1 View  Result Date: 08/02/2017 CLINICAL DATA:  Fever with urinary retention EXAM: PORTABLE CHEST 1 VIEW COMPARISON:  07/25/2017 FINDINGS: Stable cardiomegaly with aortic atherosclerosis. Chronic mild interstitial prominence without alveolar confluence or overt pulmonary edema. Slight blunting the right lateral costophrenic angle may reflect a tiny right effusion. No acute osseous abnormality. The patient's chin obscures the right lung apex. IMPRESSION: Stable cardiomegaly with aortic atherosclerosis. Chronic mild interstitial prominence probable trace right effusion. Electronically Signed   By: Ashley Royalty  M.D.   On: 08/02/2017 02:03    Review of Systems  Constitutional: Positive for fever, malaise/fatigue and weight loss.  HENT: Negative.   Eyes: Negative.   Respiratory: Negative.   Cardiovascular: Negative.   Gastrointestinal: Negative.   Genitourinary: Positive for hematuria. Negative for flank pain.  Musculoskeletal: Negative.   Skin: Negative.   Endo/Heme/Allergies: Negative.   Psychiatric/Behavioral: Positive for memory loss.   Blood pressure 136/73, pulse 63, temperature 98.5 F (36.9 C), temperature source Oral, resp. rate 20, height 6' (1.829 m), weight 99.8 kg (220 lb), SpO2 95 %. Physical Exam  Constitutional:  Very frail. IN and out of consiosness. AO x1. Wife at bedside who provides nearly all history and meaningful interaction.   Eyes: Pupils are equal, round, and reactive to light.  Neck: Normal range of motion.  Cardiovascular: Normal rate.  Respiratory: Effort normal.  GI: Soft.  Genitourinary: Penis normal.  Genitourinary Comments: Foley in place with yellow urine.   Musculoskeletal: Normal range of motion.  Neurological:  AOx1  Skin: Skin is warm.    Assessment/Plan:  1 - Gross Hematuria - from large prostate. He is already on finasteride (best medical therapy to minimize), fortunately this has resolved and is not complicated by clots or anemia.   2 - Recurrent Urinary Infections - No hydro / stones / surgically modifiable factors. Agree with current therapy.   3 - Urinary Retention - continue current foley, without he will wind up in retention as he has many times before which would actually increase his infection risk.    4 - Bilateral Renal Cysts - no indication for further evaluation.  5- DC Planning - pt becoming more functionally debilitated I doubt his wife will be able to meet his care needs at home. Rehab / Assisted / Nursing placement may be most appropriate.      Teofila Bowery 08/02/2017, 6:03 PM

## 2017-08-02 NOTE — Progress Notes (Signed)
ED TO INPATIENT HANDOFF REPORT  Name/Age/Gender [name] 84 y.o. male  Code Status    Code Status Orders  (From admission, onward)        Start     Ordered   08/02/17 0132  Full code  Continuous     08/02/17 0133    Code Status History    Date Active Date Inactive Code Status Order ID Comments User Context   07/25/2017 1023 07/29/2017 2130 Full Code [id number]  Samella Parr, NP ED   07/13/2017 1855 07/16/2017 1718 Full Code [id number]  Mendel Corning, MD Inpatient    Advance Directive Documentation     Most Recent Value  Type of Advance Directive  Healthcare Power of Attorney  Pre-existing out of facility DNR order (yellow form or pink MOST form)  -  "MOST" Form in Place?  -      Home/SNF/Other Skilled nursing facility  Chief Complaint Urinary Retention  Level of Care/Admitting Diagnosis ED Disposition    ED Disposition Condition [place]: [place] [100102]  Level of Care: Med-Surg [16]  Diagnosis: Complicated UTI (urinary tract infection) [366294]  Admitting Physician: Doreatha Massed  Attending Physician: Etta Quill [phone]  Estimated length of stay: past midnight tomorrow  Certification:: I certify this patient will need inpatient services for at least 2 midnights  PT Class (Do Not Modify): Inpatient [101]  PT Acc Code (Do Not Modify): Private [1]       Medical History Past Medical History:  Diagnosis Date  . Atrial fibrillation (HCC)    chronic  . BPH (benign prostatic hyperplasia)   . CAD (coronary artery disease)    s/p MI, unsure when  . Chronic diastolic heart failure ([place])   . Chronic indwelling Foley catheter   . CKD (chronic kidney disease), stage III ([place])   . Cognitive impairment    from stroke  . CVA (cerebrovascular accident) ([place]) x4 (latest 6503)   embolic, residual cognitive impairment  . DOE (dyspnea on exertion)   . Gout   . HTN (hypertension)   . Hyperlipidemia      Allergies Allergies  Allergen Reactions  . Benazepril Hcl Other (See Comments)    Lethargic, nervous, nauseated    IV Location/Drains/Wounds Patient Lines/Drains/Airways Status   Active Line/Drains/Airways    Name:   Placement date:   Placement time:   Site:   Days:   Peripheral IV 08/01/17 Right Antecubital   08/01/17    2230    Antecubital   1   Peripheral IV 08/01/17 Left Hand   08/01/17    2245    Hand   1   Urethral Catheter Lanell Matar, RN Latex;Triple-lumen 22 Fr.   08/01/17    2300    Latex;Triple-lumen   1          Labs/Imaging Results for orders placed or performed during the hospital encounter of 08/01/17 (from the past 48 hour(s))  Urinalysis, Routine w reflex microscopic     Status: Abnormal   Collection Time: 08/01/17 10:12 PM  Result Value Ref Range   Color, Urine YELLOW YELLOW   APPearance HAZY (A) CLEAR   Specific Gravity, Urine 1.014 1.005 - 1.030   pH 6.0 5.0 - 8.0   Glucose, UA NEGATIVE NEGATIVE mg/dL   Hgb urine dipstick LARGE (A) NEGATIVE   Bilirubin Urine NEGATIVE NEGATIVE   Ketones, ur NEGATIVE NEGATIVE mg/dL   Protein, ur 30 (A) NEGATIVE  mg/dL   Nitrite NEGATIVE NEGATIVE   Leukocytes, UA SMALL (A) NEGATIVE   RBC / HPF >50 (H) 0 - 5 RBC/hpf   WBC, UA 6-10 0 - 5 WBC/hpf   Bacteria, UA RARE (A) NONE SEEN    Comment: Performed at [facility], [place] [address] [place], [place] [address]  Comprehensive metabolic panel     Status: Abnormal   Collection Time: 08/01/17 10:30 PM  Result Value Ref Range   Sodium 137 135 - 145 mmol/L   Potassium 3.8 3.5 - 5.1 mmol/L   Chloride 107 101 - 111 mmol/L   CO2 22 22 - 32 mmol/L   Glucose, Bld 149 (H) 65 - 99 mg/dL   BUN 31 (H) 6 - 20 mg/dL   Creatinine, Ser 1.23 0.61 - 1.24 mg/dL   Calcium 9.9 8.9 - 10.3 mg/dL   Total Protein 7.2 6.5 - 8.1 g/dL   Albumin 3.0 (L) 3.5 - 5.0 g/dL   AST 97 (H) 15 - 41 U/L   ALT 94 (H) 17 - 63 U/L   Alkaline Phosphatase 186 (H) 38 - 126 U/L   Total  Bilirubin 1.1 0.3 - 1.2 mg/dL   GFR calc non Af Amer 50 (L) >60 mL/min   GFR calc Af Amer 58 (L) >60 mL/min    Comment: (NOTE) The eGFR has been calculated using the CKD EPI equation. This calculation has not been validated in all clinical situations. eGFR's persistently <60 mL/min signify possible Chronic Kidney Disease.    Anion gap 8 5 - 15    Comment: Performed at [facility], [place] [address], [place], [place] [address]  CBC WITH DIFFERENTIAL     Status: Abnormal   Collection Time: 08/01/17 10:30 PM  Result Value Ref Range   WBC 10.6 (H) 4.0 - 10.5 K/uL   RBC 3.96 (L) 4.22 - 5.81 MIL/uL   Hemoglobin 12.8 (L) 13.0 - 17.0 g/dL   HCT 38.6 (L) 39.0 - 52.0 %   MCV 97.5 78.0 - 100.0 fL   MCH 32.3 26.0 - 34.0 pg   MCHC 33.2 30.0 - 36.0 g/dL   RDW 14.1 11.5 - 15.5 %   Platelets 297 150 - 400 K/uL   Neutrophils Relative % 79 %   Neutro Abs 8.4 (H) 1.7 - 7.7 K/uL   Lymphocytes Relative 7 %   Lymphs Abs 0.8 0.7 - 4.0 K/uL   Monocytes Relative 13 %   Monocytes Absolute 1.4 (H) 0.1 - 1.0 K/uL   Eosinophils Relative 1 %   Eosinophils Absolute 0.1 0.0 - 0.7 K/uL   Basophils Relative 0 %   Basophils Absolute 0.0 0.0 - 0.1 K/uL    Comment: Performed at [facility], [place] [address], [place], [place] [address]  I-Stat CG4 Lactic Acid, ED  (not at  [facility])     Status: None   Collection Time: 08/01/17 10:36 PM  Result Value Ref Range   Lactic Acid, Venous 1.10 0.5 - 1.9 mmol/L  Protime-INR     Status: Abnormal   Collection Time: 08/01/17 11:04 PM  Result Value Ref Range   Prothrombin Time 18.8 (H) 11.4 - 15.2 seconds   INR 1.59     Comment: Performed at [facility], [place] [address], [place],  [address]  I-Stat CG4 Lactic Acid, ED  (not at  [facility])     Status: None   Collection Time: 08/02/17 12:33 AM  Result Value Ref Range  Lactic Acid, Venous 0.75 0.5 - 1.9 mmol/L   US Abdomen Complete  Result Date: 08/02/2017 CLINICAL  DATA:  Transaminitis and sepsis. EXAM: ABDOMEN ULTRASOUND COMPLETE COMPARISON:  CT 07/25/2017 FINDINGS: Gallbladder: No gallstones or wall thickening visualized. No sonographic Murphy sign noted by sonographer. Common bile duct: Diameter: 3.6 mm and normal. Liver: No focal lesion identified. Within normal limits in parenchymal echogenicity. No intrahepatic ductal dilatation. Portal vein is patent on color Doppler imaging with normal direction of blood flow towards the liver. IVC: No abnormality visualized. Pancreas: Visualized portion unremarkable. Spleen: Size and appearance within normal limits. Right Kidney: Length: 12.3 cm. Echogenicity within normal limits. Exophytic cyst arising from the interpolar and lower pole of the right kidney measuring 5.7 x 2.4 x 3.3 cm and 4.3 x 2.3 x 3.6 cm. These contain low-level internal echoes. No focal mural nodules. No septations. Left Kidney: Length: 10.7 cm. Echogenicity within normal limits. No mass or hydronephrosis visualized. Small cysts measuring 1.8 x 1.4 x 1 cm in the upper pole and 1.4 x 1.4 x 1.2 cm are noted simple in appearance. Abdominal aorta: No aneurysm visualized. Limited assessment due to overlying bowel. Other findings: Urinary bladder is decompressed by Foley catheter. IMPRESSION: 1. Unremarkable appearance of the liver and gallbladder. No findings for the patient's transaminitis. 2. Decompressed urinary bladder with Foley catheter. Bilateral renal cysts. Electronically Signed   By: Ashley Royalty M.D.   On: 08/02/2017 03:34   Dg Chest Portable 1 View  Result Date: 08/02/2017 CLINICAL DATA:  Fever with urinary retention EXAM: PORTABLE CHEST 1 VIEW COMPARISON:  07/25/2017 FINDINGS: Stable cardiomegaly with aortic atherosclerosis. Chronic mild interstitial prominence without alveolar confluence or overt pulmonary edema. Slight blunting the right lateral costophrenic angle may reflect a tiny right effusion. No acute osseous abnormality. The patient's chin  obscures the right lung apex. IMPRESSION: Stable cardiomegaly with aortic atherosclerosis. Chronic mild interstitial prominence probable trace right effusion. Electronically Signed   By: Ashley Royalty M.D.   On: 08/02/2017 02:03    Pending Labs Unresulted Labs (From admission, onward)   Start     Ordered   08/02/17 0500  CBC  Tomorrow morning,   R     08/02/17 0133   08/02/17 0500  Comprehensive metabolic panel  Tomorrow morning,   R     08/02/17 0133   08/02/17 0139  Urine Culture  Once,   R     08/02/17 0138   08/01/17 2212  Blood Culture (routine x 2)  BLOOD CULTURE X 2,   STAT     08/01/17 2212      Vitals/Pain Today's Vitals   08/02/17 0100 08/02/17 0144 08/02/17 0230 08/02/17 0300  BP: (!) 157/76 (!) 150/80 131/66 (!) 154/65  Pulse:  75 79 82  Resp: 19 18 (!) 25 (!) 26  Temp:      TempSrc:      SpO2:  92% 93% 95%  Weight:      Height:        Isolation Precautions No active isolations  Medications Medications  allopurinol (ZYLOPRIM) tablet 100 mg (has no administration in time range)  atorvastatin (LIPITOR) tablet 80 mg (has no administration in time range)  digoxin (LANOXIN) tablet 125 mcg (has no administration in time range)  finasteride (PROSCAR) tablet 5 mg (has no administration in time range)  metoprolol succinate (TOPROL-XL) 24 hr tablet 12.5 mg (has no administration in time range)  apixaban (ELIQUIS) tablet 5 mg (has no administration in time range)  acetaminophen (TYLENOL) tablet 650 mg (has no administration in time range)    Or  acetaminophen (TYLENOL) suppository 650 mg (has no administration in time range)  ondansetron (ZOFRAN) tablet 4 mg (has no administration in time range)    Or  ondansetron (ZOFRAN) injection 4 mg (has no administration in time range)  ceFEPIme (MAXIPIME) 2 g in sodium chloride 0.9 % 100 mL IVPB (has no administration in time range)  sodium chloride 0.9 % bolus 1,000 mL (0 mLs Intravenous Stopped 08/01/17 2337)  ceFEPIme  (MAXIPIME) 2 g in sodium chloride 0.9 % 100 mL IVPB (0 g Intravenous Stopped 08/02/17 0207)    Mobility walks with device

## 2017-08-02 NOTE — H&P (Signed)
History and Physical    [name]:[id number] DOB: 05-Jan-1926 DOA: 08/01/2017  PCP: Eustaquio Boyden, MD  Patient coming from: SNF  I have personally briefly reviewed patient's old medical records in [facility]  Chief Complaint: Fever, urinary retention  HPI: [name] is a 84 y.o. male with medical history significant of a.fib on eliquis, vascular dementia, prior strokes.  Patient recently admitted to our service from 6/6 to 6/8 and then from 6/17 to 6/21.  Patient had UTI with E.Coli, klebsellia, and pseudomonas bacteremia during the 6/6 admit.  Pseudomonas UTI during the 6/17 admit with an MDRO organism.  He also had small R posterior frontal infarct, likely embolic from a ? L atrial mural thrombus secondary to stopping anticoagulation for a period of time due to hematuria.  Additionally he had acute RLE DVT and B PEs demonstrated on CT scan.  He was discharged on 6/21 on eliquis (for the numerous thromboembolic issues noted above).  Today he had urinary retention at the SNF.  They were unable to clear foley.  Sent in for this.   ED Course: Fever 101.x.  Foley replaced, blood clot noted on foley, able to get UOP after replacement.  WBC 10.6k.  UA with trace LE, 6-10WBC, and > 50 RBC / HPF.  Also has mild transaminitis.   Review of Systems: As per HPI otherwise 10 point review of systems negative.   Past Medical History:  Diagnosis Date  . Atrial fibrillation (HCC)    chronic  . BPH (benign prostatic hyperplasia)   . CAD (coronary artery disease)    s/p MI, unsure when  . Chronic diastolic heart failure (HCC)   . Chronic indwelling Foley catheter   . CKD (chronic kidney disease), stage III (HCC)   . Cognitive impairment    from stroke  . 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 DOE (dyspnea on exertion)   . Gout   . HTN (hypertension)   . Hyperlipidemia     Past Surgical History:    Procedure Laterality Date  . APPENDECTOMY  1994  . CARDIOVASCULAR STRESS TEST  01/11/2008   evidence of an old scar of the septum and a larger apical scar but no reversible ischemia, EF 48%  . CATARACT EXTRACTION Left 2008   Epps  . EXCISION MORTON'S NEUROMA  1995   x 2  . HEMORRHOID SURGERY    . TONSILLECTOMY  1964  . US ECHOCARDIOGRAPHY  12/19/2007   EF 55-60%, mild aortic stenosis and mild pulmonary hypertension and mild mitral regurgitation     reports that he quit smoking about 67 years ago. He has never used smokeless tobacco. He reports that he does not drink alcohol or use drugs.  Allergies  Allergen Reactions  . Benazepril Hcl Other (See Comments)    Lethargic, nervous, nauseated    Family History  Problem Relation Age of Onset  . Cancer Brother        brain  . Cancer Sister        colon  . CAD Sister   . Alcohol abuse Brother      Prior to Admission medications   Medication Sig Start Date End Date Taking? Authorizing Provider  allopurinol (ZYLOPRIM) 100 MG tablet Take 1 tablet (100 mg total) by mouth daily. 02/21/17  Yes Eustaquio Boyden, MD  atorvastatin (LIPITOR) 80 MG tablet Take 1 tablet (80 mg total) by mouth daily at 6 PM. 07/28/17  Yes Ghimire, Werner LeanShanker M, MD  digoxin (LANOXIN) 0.125 MG tablet Take 1 tablet (125 mcg total) by mouth every other day. 01/31/17  Yes Nahser, Deloris PingPhilip J, MD  finasteride (PROSCAR) 5 MG tablet Take 5 mg by mouth daily.   Yes [provider]  furosemide (LASIX) 40 MG tablet TAKEONE TABLETS BY MOUTH ONCE DAILY IN THE MORNING 07/28/17  Yes Ghimire, Werner LeanShanker M, MD  metoprolol succinate (TOPROL-XL) 25 MG 24 hr tablet TAKE ONE-HALF TABLET BY MOUTH ONCE DAILY Patient taking differently: Take 12.5 mg by mouth daily. TAKE ONE-HALF TABLET BY MOUTH ONCE DAILY 01/06/17  Yes Nahser, Deloris PingPhilip J, MD  nitroGLYCERIN (NITROSTAT) 0.4 MG SL tablet Place 0.4 mg under the tongue every 5 (five) minutes as needed (for chest pain).    Yes [provider]  Omega-3 Fatty Acids (FISH OIL) 1000 MG CAPS Take 1,000 mg by mouth daily.    Yes [provider]  apixaban (ELIQUIS) 5 MG TABS tablet Take 1 tablet (5 mg total) by mouth 2 (two) times daily. Starting ON 6/27 AT 10 AM Patient not taking: Reported on 08/01/2017 08/04/17   Maretta BeesGhimire, Shanker M, MD    Physical Exam: Vitals:   08/02/17 0006 08/02/17 0030 08/02/17 0100 08/02/17 0144  BP: (!) 141/69 (!) 149/83 (!) 157/76 (!) 150/80  Pulse: 80 87  75  Resp: (!) 25 (!) 26 19 18   Temp:      TempSrc:      SpO2: 95% 91%  92%  Weight:      Height:        Constitutional: NAD, calm, comfortable, Sleepy due to time of night (2am). Eyes: PERRL, lids and conjunctivae normal ENMT: Mucous membranes are moist. Posterior pharynx clear of any exudate or lesions.Normal dentition.  Neck: normal, supple, no masses, no thyromegaly Respiratory: clear to auscultation bilaterally, no wheezing, no crackles. Normal respiratory effort. No accessory muscle use.  Cardiovascular: IRR, IRR Abdomen: no tenderness, no masses palpated. No hepatosplenomegaly. Bowel sounds positive.  Musculoskeletal: no clubbing / cyanosis. No joint deformity upper and lower extremities. Good ROM, no contractures. Normal muscle tone.  Skin: no rashes, lesions, ulcers. No induration Neurologic: CN 2-12 grossly intact. Sensation intact, DTR normal. Strength 5/5 in all 4.  Psychiatric: Normal judgment and insight. Alert and oriented x 3. Normal mood.    Labs on Admission: I have personally reviewed following labs and imaging studies  CBC: Recent Labs  Lab 07/27/17 0647 07/28/17 0502 07/29/17 0353 08/01/17 2230  WBC 13.0* 13.1* 13.7* 10.6*  NEUTROABS  --   --   --  8.4*  HGB 12.9* 12.1* 12.0* 12.8*  HCT 39.8 37.5* 36.9* 38.6*  MCV 98.3 100.0 99.2 97.5  PLT 198 186 191 297   Basic Metabolic Panel: Recent Labs  Lab 07/27/17 0647 07/28/17 0502 08/01/17 2230  NA 143 141 137  K 3.7 3.9 3.8  CL 108 108 107    CO2 26 26 22   GLUCOSE 118* 123* 149*  BUN 24* 30* 31*  CREATININE 1.51* 1.42* 1.23  CALCIUM 10.4* 10.2 9.9  MG 2.1  --   --    GFR: Estimated Creatinine Clearance: 49.8 mL/min (by C-G formula based on SCr of 1.23 mg/dL). Liver Function Tests: Recent Labs  Lab 07/27/17 0647 08/01/17 2230  AST 26 97*  ALT 21 94*  ALKPHOS 57 186*  BILITOT 1.0 1.1  PROT 6.8 7.2  ALBUMIN 3.2* 3.0*   No results for input(s): LIPASE, AMYLASE in the last 168 hours. No results  for input(s): AMMONIA in the last 168 hours. Coagulation Profile: Recent Labs  Lab 07/26/17 0640 08/01/17 2304  INR 1.23 1.59   Cardiac Enzymes: No results for input(s): CKTOTAL, CKMB, CKMBINDEX, TROPONINI in the last 168 hours. BNP (last 3 results) No results for input(s): PROBNP in the last 8760 hours. HbA1C: No results for input(s): HGBA1C in the last 72 hours. CBG: No results for input(s): GLUCAP in the last 168 hours. Lipid Profile: No results for input(s): CHOL, HDL, LDLCALC, TRIG, CHOLHDL, LDLDIRECT in the last 72 hours. Thyroid Function Tests: No results for input(s): TSH, T4TOTAL, FREET4, T3FREE, THYROIDAB in the last 72 hours. Anemia Panel: No results for input(s): VITAMINB12, FOLATE, FERRITIN, TIBC, IRON, RETICCTPCT in the last 72 hours. Urine analysis:    Component Value Date/Time   COLORURINE YELLOW 08/01/2017 2212   APPEARANCEUR HAZY (A) 08/01/2017 2212   LABSPEC 1.014 08/01/2017 2212   PHURINE 6.0 08/01/2017 2212   GLUCOSEU NEGATIVE 08/01/2017 2212   HGBUR LARGE (A) 08/01/2017 2212   BILIRUBINUR NEGATIVE 08/01/2017 2212   KETONESUR NEGATIVE 08/01/2017 2212   PROTEINUR 30 (A) 08/01/2017 2212   UROBILINOGEN 0.2 03/24/2010 1755   NITRITE NEGATIVE 08/01/2017 2212   LEUKOCYTESUR SMALL (A) 08/01/2017 2212    Radiological Exams on Admission: No results found.  EKG: Independently reviewed.  Assessment/Plan Principal Problem:   Complicated UTI (urinary tract infection) Active Problems:    Permanent atrial fibrillation (HCC)   History of cardioembolic cerebrovascular accident (CVA)   Chronic indwelling Foley catheter   ? Mural thrombus of left atrium   Bilateral pulmonary embolism (HCC)   Transaminitis    1. CAUTI with recent MDRO pseudomonas just a few days ago - 1. Cefepime per pharm 2. BCx and UCx pending 3. Watch hematuria, add CBI if needed, but cant really stop anticoagulation right now (see discussion of multiple thromboembolic issues below). 4. Gentle hydration by holding lasix 5. US renal 2. A.Fib - 1. Cont eliquis 2. Cont metoprolol for rate control 3. Numerous thromboembolic issues this past month: 1. H/o recent tiny embolic CVA 2. With ? Mural thrombus of L atrium 3. Has RLE DVT 4. With B PEs too! 5. Obviously will have to continue eliquis despite hematuria. 4. Transaminitis 1. [place] RUQ 2. Repeat CMP in AM 3. No stones or gallbladder abnormalities on CT just earlier this month though.  DVT prophylaxis: Eliquis Code Status: Full Code for now - Family will discuss further Family Communication: Family at bedside Disposition Plan: SNF after admit Consults called: None Admission status: Admit to inpatient   Hillary Bow DO [facility] Pager [phone] Only works nights!  If 7AM-7PM, please contact the primary day team physician taking care of patient  [email or web address] Password [email or web address]  08/02/2017, 2:01 AM

## 2017-08-03 ENCOUNTER — Inpatient Hospital Stay: Payer: Self-pay

## 2017-08-03 ENCOUNTER — Inpatient Hospital Stay (HOSPITAL_COMMUNITY): Payer: Medicare Other

## 2017-08-03 LAB — CBC WITH DIFFERENTIAL/PLATELET
BASOS ABS: 0.1 10*3/uL (ref 0.0–0.1)
Basophils Relative: 1 %
EOS ABS: 0.9 10*3/uL — AB (ref 0.0–0.7)
Eosinophils Relative: 10 %
HCT: 37.3 % — ABNORMAL LOW (ref 39.0–52.0)
HEMOGLOBIN: 12.4 g/dL — AB (ref 13.0–17.0)
LYMPHS ABS: 0.9 10*3/uL (ref 0.7–4.0)
LYMPHS PCT: 10 %
MCH: 32.2 pg (ref 26.0–34.0)
MCHC: 33.2 g/dL (ref 30.0–36.0)
MCV: 96.9 fL (ref 78.0–100.0)
Monocytes Absolute: 1.2 10*3/uL — ABNORMAL HIGH (ref 0.1–1.0)
Monocytes Relative: 14 %
NEUTROS PCT: 65 %
Neutro Abs: 5.6 10*3/uL (ref 1.7–7.7)
PLATELETS: 311 10*3/uL (ref 150–400)
RBC: 3.85 MIL/uL — AB (ref 4.22–5.81)
RDW: 14.2 % (ref 11.5–15.5)
WBC: 8.6 10*3/uL (ref 4.0–10.5)

## 2017-08-03 LAB — COMPREHENSIVE METABOLIC PANEL
ALK PHOS: 152 U/L — AB (ref 38–126)
ALT: 85 U/L — AB (ref 0–44)
AST: 81 U/L — AB (ref 15–41)
Albumin: 2.7 g/dL — ABNORMAL LOW (ref 3.5–5.0)
Anion gap: 5 (ref 5–15)
BUN: 27 mg/dL — AB (ref 8–23)
CALCIUM: 9.8 mg/dL (ref 8.9–10.3)
CHLORIDE: 111 mmol/L (ref 98–111)
CO2: 25 mmol/L (ref 22–32)
CREATININE: 1.14 mg/dL (ref 0.61–1.24)
GFR calc Af Amer: >60 mL/min (ref >60)
GFR calc non Af Amer: 55 mL/min — ABNORMAL LOW (ref >60)
GLUCOSE: 105 mg/dL — AB (ref 70–99)
Potassium: 4.4 mmol/L (ref 3.5–5.1)
SODIUM: 141 mmol/L (ref 135–145)
Total Bilirubin: 0.9 mg/dL (ref 0.3–1.2)
Total Protein: 6.5 g/dL (ref 6.5–8.1)

## 2017-08-03 LAB — MAGNESIUM: Magnesium: 2.3 mg/dL (ref 1.7–2.4)

## 2017-08-03 MED ORDER — SODIUM CHLORIDE 0.9% FLUSH: 10.0000 mL | INTRAVENOUS | Status: DC | PRN

## 2017-08-03 MED ORDER — HYDROCORTISONE 1 % EX CREA
1.0000 "application " | TOPICAL_CREAM | Freq: Three times a day (TID) | CUTANEOUS | Status: DC | PRN
  Filled 2017-08-03: qty 28

## 2017-08-03 MED ORDER — SODIUM CHLORIDE 0.9% FLUSH
10.0000 mL | Freq: Two times a day (BID) | INTRAVENOUS | Status: DC
  Administered 2017-08-03: 10 mL
  Administered 2017-08-04: 20 mL

## 2017-08-03 NOTE — Evaluation (Addendum)
Clinical/Bedside Swallow Evaluation Patient Details  Name: [name] MRN: [id number] Date of Birth: 1925-02-11  Today's Date: 08/03/2017 Time: SLP Start Time (ACUTE ONLY): 1250 SLP Stop Time (ACUTE ONLY): 1315 SLP Time Calculation (min) (ACUTE ONLY): 25 min  Past Medical History:  Past Medical History:  Diagnosis Date  . Atrial fibrillation (HCC)    chronic  . BPH (benign prostatic hyperplasia)   . CAD (coronary artery disease)    s/p MI, unsure when  . Chronic diastolic heart failure (HCC)   . Chronic indwelling Foley catheter   . CKD (chronic kidney disease), stage III (HCC)   . Cognitive impairment    from stroke  . 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 DOE (dyspnea on exertion)   . Gout   . HTN (hypertension)   . Hyperlipidemia    Past Surgical History:  Past Surgical History:  Procedure Laterality Date  . APPENDECTOMY  1994  . CARDIOVASCULAR STRESS TEST  01/11/2008   evidence of an old scar of the septum and a larger apical scar but no reversible ischemia, EF 48%  . CATARACT EXTRACTION Left 2008   Epps  . EXCISION MORTON'S NEUROMA  1995   x 2  . HEMORRHOID SURGERY    . TONSILLECTOMY  1964  . US ECHOCARDIOGRAPHY  12/19/2007   EF 55-60%, mild aortic stenosis and mild pulmonary hypertension and mild mitral regurgitation   HPI:  Pt is an 84 yo readmitted with UTI/gross hematuria.    H/o recurrent UTIs, CVA x 4, vascular dementia, CKD, and CAD.   Per wife, pt sleeps "all the time" prior to admit -note plans for palliative referral. Pt continues with mild right facial droop, dysarthria and slowing of his speech. Wife denies pt having dysphagia prior to admit.    Assessment / Plan / Recommendation Clinical Impression  Bedside swallow evaluation was ordered due to concerns for aspiration.  Pt recently seen by SLP during prior admit.  He is afebrile and lungs are documented to be clear to auscultation per  chart review.  Pt continues to have labored breathing as he did on cognitive evaluation (6/18).  Pt had no coughing with cup sips of thin liquids but had coughing when consuming thins via straw.  Family reports that coughing this morning also appeared to continue to coincide with straw sips of liquids.  As a result, recommend that pt remain on regular textures, thin liquids, no straws, meds whole in puree.  Provided education regarding rationale for recommendations wife who were present.  All questions were answered to their satisfaction at this time.  SLP will follow up for management of safe diet progression and for indication for MBS.  Note plan for palliative referral and wife reports pt sleeping during the day frequently.   SLP Visit Diagnosis: Dysphagia, pharyngeal phase (R13.13)    Aspiration Risk  Moderate aspiration risk    Diet Recommendation Regular;Thin liquid   Liquid Administration via: Cup Medication Administration: Whole meds with puree Supervision: Patient able to self feed Compensations: Slow rate;Small sips/bites Postural Changes: Seated upright at 90 degrees    Other  Recommendations Oral Care Recommendations: Oral care BID   Follow up Recommendations None      Frequency and Duration min 2x/week  2 weeks       Prognosis Prognosis for Safe Diet Advancement: Guarded Barriers to Reach Goals: Severity of deficits;Time post onset      Swallow Study   General  Date of Onset: 08/03/17 HPI: Pt is an 84 yo readmitted with UTI/gross hematuria.    H/o recurrent UTIs, CVA x 4, vascular dementia, CKD, and CAD.   Per wife, pt sleeps "all the time" prior to admit -note plans for palliative referral. Pt continues with mild right facial droop, dysarthria and slowing of his speech. Wife denies pt having dysphagia prior to admit.  Type of Study: Bedside Swallow Evaluation Diet Prior to this Study: Regular;Thin liquids Temperature Spikes Noted: No Respiratory Status: Room  air History of Recent Intubation: No Behavior/Cognition: Lethargic/Drowsy Oral Cavity Assessment: Within Functional Limits Oral Care Completed by SLP: No Oral Cavity - Dentition: Adequate natural dentition Vision: Impaired for self-feeding Self-Feeding Abilities: Needs assist(can help hand over hand) Patient Positioning: Upright in chair Baseline Vocal Quality: Normal Volitional Cough: Weak Volitional Swallow: Able to elicit    Oral/Motor/Sensory Function Overall Oral Motor/Sensory Function: Mild impairment Facial Symmetry: Abnormal symmetry right Facial Strength: Reduced right Facial Sensation: Reduced right   Ice Chips Ice chips: Not tested   Thin Liquid Thin Liquid: Impaired Presentation: Cup;Straw Pharyngeal  Phase Impairments: Cough - Immediate    Nectar Thick Nectar Thick Liquid: Not tested   Honey Thick Honey Thick Liquid: Not tested   Puree Puree: Within functional limits Presentation: Spoon   Solid   GO   Solid: Within functional limits Presentation: Self [name]        [name] 08/03/2017,1:21 PM    Ivan Burnetamara Tywon Niday, MS [facility] SLP [phone]

## 2017-08-03 NOTE — Evaluation (Signed)
Physical Therapy Evaluation Patient Details Name: [name] MRN: [id number] DOB: [birth year] Today's Date: 08/03/2017   History of Present Illness  Pt was admitted for complicated UTI/gross hematuria.    H/o recurrent UTIs, CVA x 4, vascular dementia, CKD, and CAD  Clinical Impression  Pt admitted as above and presenting with functional mobility limitations 2* generalized weakness, limited endurance and balance deficits.  Pt would benefit from follow up rehab at SNF level to maximize IND and safety.    Follow Up Recommendations SNF;Supervision/Assistance - 24 hour    Equipment Recommendations  None recommended by PT    Recommendations for Other Services OT consult     Precautions / Restrictions Precautions Precautions: Fall Restrictions Weight Bearing Restrictions: No      Mobility  Bed Mobility Overal bed mobility: Needs Assistance Bed Mobility: Supine to Sit     Supine to sit: Mod assist;+2 for physical assistance     General bed mobility comments: Increased time  and assist for legs and trunk.    Transfers Overall transfer level: Needs assistance Equipment used: Rolling walker (2 wheeled) Transfers: Sit to/from Stand Sit to Stand: Min assist;+2 physical assistance;From elevated surface         General transfer comment: VC for hand placement and safety  Ambulation/Gait Ambulation/Gait assistance: Min assist;+2 physical assistance Gait Distance (Feet): 22 Feet Assistive device: Rolling walker (2 wheeled) Gait Pattern/deviations: Step-to pattern;Decreased stride length;Shuffle;Trunk flexed;Narrow base of support Gait velocity: decreased   General Gait Details: Increased time with cues for posture and position from RW.  Distance ltd by pt fatigue  Stairs            Wheelchair Mobility    Modified Rankin (Stroke Patients Only)       Balance Overall balance assessment: Needs assistance Sitting-balance support: Bilateral upper extremity  supported;Feet supported Sitting balance-Leahy Scale: Fair     Standing balance support: Bilateral upper extremity supported;During functional activity Standing balance-Leahy Scale: Poor                               Pertinent Vitals/Pain Pain Assessment: No/denies pain    Home Living Family/patient expects to be discharged to:: Skilled nursing facility                 Additional Comments: had been at [facility] for rehab for a few days prior to this admission    Prior Function Level of Independence: Needs assistance   Gait / Transfers Assistance Needed: using rollator for ambulating household distances; often sits on rollator and pedals with his feet for longer distance     Comments: prior to SNF, pt was independent dressing. Wife assisted with bathing     Hand Dominance        Extremity/Trunk Assessment   Upper Extremity Assessment Upper Extremity Assessment: Defer to OT evaluation    Lower Extremity Assessment Lower Extremity Assessment: Generalized weakness    Cervical / Trunk Assessment Cervical / Trunk Assessment: Kyphotic  Communication   Communication: No difficulties  Cognition Arousal/Alertness: Awake/alert Behavior During Therapy: WFL for tasks assessed/performed Overall Cognitive Status: Impaired/Different from baseline Area of Impairment: Orientation;Following commands;Safety/judgement;Problem solving                 Orientation Level: Disoriented to;Place;Situation;Time     Following Commands: Follows one step commands with increased time Safety/Judgement: Decreased awareness of safety;Decreased awareness of deficits   Problem Solving: Slow processing;Difficulty sequencing;Requires verbal cues;Requires  tactile cues General Comments: increased time and multimodal cues for following commands       General Comments      Exercises     Assessment/Plan    PT Assessment Patient needs continued PT services  PT  Problem List Decreased strength;Decreased activity tolerance;Decreased balance;Decreased mobility;Decreased coordination;Decreased knowledge of use of DME;Decreased cognition;Decreased safety awareness;Decreased knowledge of precautions       PT Treatment Interventions DME instruction;Gait training;Stair training;Functional mobility training;Therapeutic activities;Therapeutic exercise;Balance training;Neuromuscular re-education;Cognitive remediation;Patient/family education    PT Goals (Current goals can be found in the Care Plan section)  Acute Rehab PT Goals Patient Stated Goal: walk PT Goal Formulation: With patient Time For Goal Achievement: 08/17/17 Potential to Achieve Goals: Good    Frequency Min 3X/week   Barriers to discharge        Co-evaluation PT/OT/SLP Co-Evaluation/Treatment: Yes Reason for Co-Treatment: Complexity of the patient's impairments (multi-system involvement);For patient/therapist safety PT goals addressed during session: Mobility/safety with mobility OT goals addressed during session: ADL's and self-care       AM-PAC PT "6 Clicks" Daily Activity  Outcome Measure Difficulty turning over in bed (including adjusting bedclothes, sheets and blankets)?: A Little Difficulty moving from lying on back to sitting on the side of the bed? : Unable Difficulty sitting down on and standing up from a chair with arms (e.g., wheelchair, bedside commode, etc,.)?: Unable Help needed moving to and from a bed to chair (including a wheelchair)?: A Lot Help needed walking in hospital room?: A Lot Help needed climbing 3-5 steps with a railing? : A Lot 6 Click Score: 11    End of Session Equipment Utilized During Treatment: Gait belt Activity Tolerance: Patient tolerated treatment well;Patient limited by fatigue Patient left: in chair;with call bell/phone within reach;with family/visitor present Nurse Communication: Mobility status PT Visit Diagnosis: Unsteadiness on feet  (R26.81);Other abnormalities of gait and mobility (R26.89);Muscle weakness (generalized) (M62.81)    Time: 1117-1140 PT Time Calculation (min) (ACUTE ONLY): 23 min   Charges:   PT Evaluation $PT Eval Moderate Complexity: 1 Mod     PT G Codes:        Pg [phone]   Delissa Silba 08/03/2017, 1:00 PM

## 2017-08-03 NOTE — Discharge Summary (Addendum)
Physician Discharge Summary  Varun Jourdan DSK:[id number] DOB: 1925-02-22 DOA: 08/01/2017  PCP: Ria Bush, MD  Admit date: 08/01/2017 Discharge date: 08/04/2017  Time spent: 45 minutes  Recommendations for Outpatient Follow-up:  Patient will be discharged to [facility] facility with palliative care to follow.  Patient will need to follow up with primary care provider within one week of discharge.  Follow up with urology as needed. Patient should continue medications as prescribed.  Patient should follow a heart healthy diet.   Discharge Diagnoses:  CAUTI with recent MDRO pseudomonas   Atrial fibrillation Embolic CVA/ Bilateral PE/ RLE DVT Transaminitis Goals of care  Discharge Condition: Stable   Diet recommendation: heart healthy  Filed Weights   08/01/17 2203 08/01/17 2206  Weight: 94.8 kg (209 lb) 99.8 kg (220 lb)    History of present illness:  On 08/02/2017 by Dr. Jennette Kettle  Cristina Mattern is a 84 y.o. male with medical history significant of a.fib on eliquis, vascular dementia, prior strokes.  Patient recently admitted to our service from 6/6 to 6/8 and then from 6/17 to 6/21.  Patient had UTI with E.Coli, klebsellia, and pseudomonas bacteremia during the 6/6 admit.  Pseudomonas UTI during the 6/17 admit with an MDRO organism.  He also had small R posterior frontal infarct, likely embolic from a ? L atrial mural thrombus secondary to stopping anticoagulation for a period of time due to hematuria.  Additionally he had acute RLE DVT and B PEs demonstrated on CT scan. He was discharged on 6/21 on eliquis (for the numerous thromboembolic issues noted above). Today he had urinary retention at the SNF.  They were unable to clear foley.  Sent in for this.  Hospital Course:  CAUTI with recent MDRO pseudomonas  -On admission, no leukocytosis or fever -Family stated he had altered mental status at the nursing facility -Patient also with hematuria however on  Eliquis -Blood cultures show no growth to date -Urine culture 40K Pseudomonas aeruginosa -Urine culture on 07/25/2017 showed 40K pseudomonas with drug resistance -Urology consulted and appreciated -Feel that patient is likely colonized at this time however will treat given his altered mental status -PICC line ordered for IV antibiotic continuation -Palliative care consulted and appreciated for multiple admissions and patient's failure to thrive  Chronic urinary retention -foley recently changed -patient has chronic foley that is either changed in the urology office or by home health (per Dr. Zettie Pho note on 6/25) -follow up with urology   Atrial fibrillation -Continue Eliquis, metoprolol  Embolic CVA/ Bilateral PE/ RLE DVT -Continue Eliquis  Transaminitis -RUQ [place]: Unremarkable -Continue to monitor   Goals of care -Currently DNR -palliative care consulted and appreciated   Physical deconditioning -PT/OT recommended SNF  Procedures: RUQ [place]  Consultations: Palliative care Urology  Discharge Exam: Vitals:   08/04/17 0559 08/04/17 1448  BP: (!) 153/72 (!) 156/76  Pulse: 71 80  Resp: 20 16  Temp: 97.6 F (36.4 C) 98.4 F (36.9 C)  SpO2: 97% (!) 89%   Patient states he is feeling better today. No complaints and wishes to go home.    General: Well developed, well nourished, NAD, appears stated age  60: NCAT, mucous membranes moist.  Neck: Supple  Cardiovascular: S1 S2 auscultated, irregular  Respiratory: Clear to auscultation bilaterally with equal chest rise  Abdomen: Soft, nontender, nondistended, + bowel sounds  Extremities: warm dry without cyanosis clubbing or edema  Neuro: AAOx3, nonfocal  Psych: Normal affect and demeanor with intact judgement and insight  Discharge Instructions Discharge Instructions    Discharge instructions   Complete by:  As directed    Patient will be discharged to [facility] facility with palliative care to follow.   Patient will need to follow up with primary care provider within one week of discharge.  Follow up with urology as needed. Patient should continue medications as prescribed.  Patient should follow a heart healthy diet.     Allergies as of 08/04/2017      Reactions   Benazepril Hcl Other (See Comments)   Lethargic, nervous, nauseated      Medication List    STOP taking these medications   furosemide 40 MG tablet Commonly known as:  LASIX     TAKE these medications   allopurinol 100 MG tablet Commonly known as:  ZYLOPRIM Take 1 tablet (100 mg total) by mouth daily.   apixaban 5 MG Tabs tablet Commonly known as:  ELIQUIS Take 1 tablet (5 mg total) by mouth 2 (two) times daily. Starting ON 6/27 AT 10 AM   atorvastatin 80 MG tablet Commonly known as:  LIPITOR Take 1 tablet (80 mg total) by mouth daily at 6 PM.   ceFEPime IVPB Commonly known as:  MAXIPIME Inject 2 g into the vein every 12 (twelve) hours for 2 days. Indication:  MDRO UTI Last Day of Therapy:  08/06/17 Labs - Once weekly:  CBC/D and BMP, Labs - Every other week:  ESR and CRP   digoxin 0.125 MG tablet Commonly known as:  LANOXIN Take 1 tablet (125 mcg total) by mouth every other day.   finasteride 5 MG tablet Commonly known as:  PROSCAR Take 5 mg by mouth daily.   Fish Oil 1000 MG Caps Take 1,000 mg by mouth daily. Notes to patient:  08/05/2017   hydrocortisone cream 1 % Apply 1 application topically 3 (three) times daily as needed for itching (minor skin irritation).   metoprolol succinate 25 MG 24 hr tablet Commonly known as:  TOPROL-XL TAKE ONE-HALF TABLET BY MOUTH ONCE DAILY What changed:    how much to take  how to take this  when to take this  additional instructions   nitroGLYCERIN 0.4 MG SL tablet Commonly known as:  NITROSTAT Place 0.4 mg under the tongue every 5 (five) minutes as needed (for chest pain).      Allergies  Allergen Reactions  . Benazepril Hcl Other (See Comments)     Lethargic, nervous, nauseated    Contact information for follow-up providers    Ria Bush, MD. Schedule an appointment as soon as possible for a visit in 1 week(s).   Specialty:  Family Medicine Why:  Hospital follow up Contact information: [place] [address] [phone]        Nahser, Wonda Cheng, MD .   Specialty:  Cardiology Contact information: [address] [place] [address] [phone]        Irine Seal, MD. Schedule an appointment as soon as possible for a visit.   Specialty:  Urology Why:  As needed Contact information: [place] [address] [phone]            Contact information for after-discharge care    Destination    [facility] .   Service:  Skilled Chiropodist information: [address] [place] [phone]  The results of significant diagnostics from this hospitalization (including imaging, microbiology, ancillary and laboratory) are listed below for reference.    Significant Diagnostic Studies: Ct Angio Head W Or Wo Contrast  Result Date: 07/25/2017 CLINICAL DATA:  Stroke follow-up EXAM: CT ANGIOGRAPHY HEAD AND NECK TECHNIQUE: Multidetector CT imaging of the head and neck was performed using the standard protocol during bolus administration of intravenous contrast. Multiplanar CT image reconstructions and MIPs were obtained to evaluate the vascular anatomy. Carotid stenosis measurements (when applicable) are obtained utilizing NASCET criteria, using the distal internal carotid diameter as the denominator. CONTRAST:  50 cc Isovue 370 intravenous COMPARISON:  Head CT from earlier today FINDINGS: CT HEAD FINDINGS Brain: Remote infarcts in the posterior left cerebrum and right cerebellum. Small acute infarct in the right cerebrum on previous brain MRI is not resolved. There is a small right lateral frontal  infarct that is remote by MRI. Advanced chronic small vessel ischemia in the cerebral white matter. No hemorrhage, hydrocephalus, or masslike finding. Vascular: See below Skull: No acute or aggressive finding Sinuses: Clear Orbits: Bilateral cataract resection Review of the MIP images confirms the above findings CTA NECK FINDINGS Aortic arch: Atherosclerotic calcification.  No acute finding. Right carotid system: Moderate primarily calcified plaque at the common carotid bifurcation and ICA bulb without flow limiting stenosis or ulceration. Left carotid system: Moderate proximal ICA calcified plaque with stenosis measuring up to 50% on coronal reformats. No ulceration or dissection. Vertebral arteries: Proximal subclavian plaque without flow limiting stenosis. Moderate atheromatous narrowing of the right vertebral origin. Both vertebral arteries are patent to the dura. Skeleton: Degenerative changes without acute or aggressive finding. Other neck: No acute finding Upper chest: Right lobar and multiple bilateral segmental to subsegmental pulmonary emboli as marked on series 13 these appear recent/acute. Review of the MIP images confirms the above findings CTA HEAD FINDINGS Anterior circulation: Extensive atherosclerotic plaque on the carotid siphons. Negative for branch occlusion. High-grade left M2 branch stenosis, see axial MIPS. Hypoplastic left A1 segment. Negative for aneurysm Posterior circulation: Vertebrobasilar arteries are smooth and diffusely patent. Fetal type left PCA. Mild-to-moderate bilateral PCA atheromatous irregularity. Venous sinuses: Patent on the delayed phase. Anatomic variants: As above Delayed phase: No abnormal intracranial enhancement. Critical Value/emergent results were called by telephone at the time of interpretation on 07/25/2017 at 1:19 pm to Dr. Erin Hearing , who verbally acknowledged these results. Review of the MIP images confirms the above findings IMPRESSION: 1. Multiple acute  pulmonary emboli bilaterally. 2. No emergent large vessel occlusion. 3. Cervical carotid atherosclerosis with up to 50% ICA stenosis on the left. 4. Moderate right vertebral origin stenosis. 5. High-grade left M2 branch stenosis. Electronically Signed   By: Monte Fantasia M.D.   On: 07/25/2017 13:27   Ct Head Wo Contrast  Result Date: 07/25/2017 CLINICAL DATA:  Syncopal episode, loss of consciousness. Recently discharged from hospital for urinary tract infection. History of stroke, atrial fibrillation. EXAM: CT HEAD WITHOUT CONTRAST TECHNIQUE: Contiguous axial images were obtained from the base of the skull through the vertex without intravenous contrast. COMPARISON:  CT HEAD September 07, 2016 FINDINGS: BRAIN: No intraparenchymal hemorrhage, mass effect nor midline shift. Slight blurring of the RIGHT parietal gray-white matter junction. Smaller RIGHT frontal lobe encephalomalacia. Confluent LEFT temporoparietal encephalomalacia with ex vacuo dilatation subjacent ventricle. Moderate to severe parenchymal brain volume loss. Confluent supratentorial white matter hypodensities. Old RIGHT cerebellar infarct. Old LEFT basal ganglia infarct. Old small LEFT occipital lobe infarct. No abnormal extra-axial fluid collections. VASCULAR:  Moderate calcific atherosclerosis of the carotid siphons. SKULL: No skull fracture. Osteopenia. No significant scalp soft tissue swelling. SINUSES/ORBITS: Atretic RIGHT maxillary sinus with bony remodeling consistent with chronic sinusitis. Mild maxillary sinus mucosal thickening. Mastoid air cells are well aerated. Status post bilateral ocular lens implants. Enophthalmos. OTHER: None. IMPRESSION: 1. Acute small RIGHT parietal/MCA territory nonhemorrhagic infarct. Old RIGHT frontal/MCA territory infarct. 2. Old large LEFT MCA territory and small LEFT PCA territory infarct. Old RIGHT cerebellar infarcts. Old LEFT basal ganglia infarct. 3. Moderate to severe chronic small vessel ischemic changes.  4. Moderate to severe parenchymal brain volume loss. 5. Acute findings discussed with and reconfirmed by Dr.CHRISTOPHER POLLINA on 07/25/2017 at 2:24 am. Electronically Signed   By: Elon Alas M.D.   On: 07/25/2017 02:26   Ct Angio Neck W Or Wo Contrast  Result Date: 07/25/2017 CLINICAL DATA:  Stroke follow-up EXAM: CT ANGIOGRAPHY HEAD AND NECK TECHNIQUE: Multidetector CT imaging of the head and neck was performed using the standard protocol during bolus administration of intravenous contrast. Multiplanar CT image reconstructions and MIPs were obtained to evaluate the vascular anatomy. Carotid stenosis measurements (when applicable) are obtained utilizing NASCET criteria, using the distal internal carotid diameter as the denominator. CONTRAST:  50 cc Isovue 370 intravenous COMPARISON:  Head CT from earlier today FINDINGS: CT HEAD FINDINGS Brain: Remote infarcts in the posterior left cerebrum and right cerebellum. Small acute infarct in the right cerebrum on previous brain MRI is not resolved. There is a small right lateral frontal infarct that is remote by MRI. Advanced chronic small vessel ischemia in the cerebral white matter. No hemorrhage, hydrocephalus, or masslike finding. Vascular: See below Skull: No acute or aggressive finding Sinuses: Clear Orbits: Bilateral cataract resection Review of the MIP images confirms the above findings CTA NECK FINDINGS Aortic arch: Atherosclerotic calcification.  No acute finding. Right carotid system: Moderate primarily calcified plaque at the common carotid bifurcation and ICA bulb without flow limiting stenosis or ulceration. Left carotid system: Moderate proximal ICA calcified plaque with stenosis measuring up to 50% on coronal reformats. No ulceration or dissection. Vertebral arteries: Proximal subclavian plaque without flow limiting stenosis. Moderate atheromatous narrowing of the right vertebral origin. Both vertebral arteries are patent to the dura. Skeleton:  Degenerative changes without acute or aggressive finding. Other neck: No acute finding Upper chest: Right lobar and multiple bilateral segmental to subsegmental pulmonary emboli as marked on series 13 these appear recent/acute. Review of the MIP images confirms the above findings CTA HEAD FINDINGS Anterior circulation: Extensive atherosclerotic plaque on the carotid siphons. Negative for branch occlusion. High-grade left M2 branch stenosis, see axial MIPS. Hypoplastic left A1 segment. Negative for aneurysm Posterior circulation: Vertebrobasilar arteries are smooth and diffusely patent. Fetal type left PCA. Mild-to-moderate bilateral PCA atheromatous irregularity. Venous sinuses: Patent on the delayed phase. Anatomic variants: As above Delayed phase: No abnormal intracranial enhancement. Critical Value/emergent results were called by telephone at the time of interpretation on 07/25/2017 at 1:19 pm to Dr. Erin Hearing , who verbally acknowledged these results. Review of the MIP images confirms the above findings IMPRESSION: 1. Multiple acute pulmonary emboli bilaterally. 2. No emergent large vessel occlusion. 3. Cervical carotid atherosclerosis with up to 50% ICA stenosis on the left. 4. Moderate right vertebral origin stenosis. 5. High-grade left M2 branch stenosis. Electronically Signed   By: Monte Fantasia M.D.   On: 07/25/2017 13:27   Mr Brain Wo Contrast  Result Date: 07/25/2017 CLINICAL DATA:  84 year old male post fall. Possible syncope. Subsequent  encounter. EXAM: MRI HEAD WITHOUT CONTRAST TECHNIQUE: Multiplanar, multiecho pulse sequences of the brain and surrounding structures were obtained without intravenous contrast. COMPARISON:  07/25/2017 head CT.  03/25/2010 brain MR. FINDINGS: Brain: Tiny acute nonhemorrhagic posterior right frontal lobe infarct. Moderately large remote left frontal-parietal-posterior temporal lobe infarct with blood-stained encephalomalacia. Subsequent mild dilation left lateral  ventricle. Remote posterior right temporal-frontal lobe infarct. Remote left occipital lobe infarct with blood-stained encephalomalacia. Remote inferior right cerebellar infarct with blood-stained encephalomalacia. Remote partially hemorrhagic left cerebellar infarct. Scattered tiny blood breakdown products consistent with areas of prior hemorrhagic ischemia. Prominent chronic microvascular changes. Moderate global atrophy. No intracranial mass lesion noted on this unenhanced exam. Vascular: Major intracranial vascular structures are patent. Atherosclerotic changes suspected without large vessel occlusion. Skull and upper cervical spine: Motion degraded. Transverse ligament hypertrophy. Degenerative changes upper cervical spine. Sinuses/Orbits: Post lens replacement without acute orbital abnormality. Mild mucosal thickening inferior maxillary sinuses. Minimal mucosal thickening ethmoid sinus air cells. Other: Negative. IMPRESSION: Tiny acute nonhemorrhagic posterior right frontal lobe infarct. Multiple remote infarcts as detailed above. Chronic microvascular changes. Atrophy. Electronically Signed   By: Genia Del M.D.   On: 07/25/2017 08:14   US Abdomen Complete  Result Date: 08/02/2017 CLINICAL DATA:  Transaminitis and sepsis. EXAM: ABDOMEN ULTRASOUND COMPLETE COMPARISON:  CT 07/25/2017 FINDINGS: Gallbladder: No gallstones or wall thickening visualized. No sonographic Murphy sign noted by sonographer. Common bile duct: Diameter: 3.6 mm and normal. Liver: No focal lesion identified. Within normal limits in parenchymal echogenicity. No intrahepatic ductal dilatation. Portal vein is patent on color Doppler imaging with normal direction of blood flow towards the liver. IVC: No abnormality visualized. Pancreas: Visualized portion unremarkable. Spleen: Size and appearance within normal limits. Right Kidney: Length: 12.3 cm. Echogenicity within normal limits. Exophytic cyst arising from the interpolar and lower  pole of the right kidney measuring 5.7 x 2.4 x 3.3 cm and 4.3 x 2.3 x 3.6 cm. These contain low-level internal echoes. No focal mural nodules. No septations. Left Kidney: Length: 10.7 cm. Echogenicity within normal limits. No mass or hydronephrosis visualized. Small cysts measuring 1.8 x 1.4 x 1 cm in the upper pole and 1.4 x 1.4 x 1.2 cm are noted simple in appearance. Abdominal aorta: No aneurysm visualized. Limited assessment due to overlying bowel. Other findings: Urinary bladder is decompressed by Foley catheter. IMPRESSION: 1. Unremarkable appearance of the liver and gallbladder. No findings for the patient's transaminitis. 2. Decompressed urinary bladder with Foley catheter. Bilateral renal cysts. Electronically Signed   By: Ashley Royalty M.D.   On: 08/02/2017 03:34   Ct Abdomen Pelvis W Contrast  Result Date: 07/25/2017 CLINICAL DATA:  Syncopal episode after standing up. Vomiting. On antibiotics for urinary tract infection. History of appendectomy, hemorrhoid surgery. EXAM: CT ABDOMEN AND PELVIS WITH CONTRAST TECHNIQUE: Multidetector CT imaging of the abdomen and pelvis was performed using the standard protocol following bolus administration of intravenous contrast. CONTRAST:  198m OMNIPAQUE IOHEXOL 300 MG/ML  SOLN COMPARISON:  Renal ultrasound July 13, 2017 FINDINGS: LOWER CHEST: Bilateral lower lobe atelectasis/scarring. 3 mm RIGHT lower lobe ground-glass pulmonary nodule, no routine indicated follow-up. The heart size is mildly enlarged. No pericardial effusion. Mild coronary artery calcification. Patchy hypodensities LEFT atrium. Enlarged heterogeneous RIGHT atrium. HEPATOBILIARY: Liver and gallbladder are normal. PANCREAS: Normal. SPLEEN: Splenosis. ADRENALS/URINARY TRACT: Kidneys are orthotopic, demonstrating symmetric enhancement. No nephrolithiasis, hydronephrosis or solid renal masses. Complex lobulated 5 cm RIGHT lower pole renal cyst with thin calcified septations. Bilateral simple renal cysts  measuring to 2.6  cm. Exophytic dense 1 cm LEFT lower pole proteinaceous cysts. The unopacified ureters are normal in course and caliber. Delayed imaging through the kidneys demonstrates symmetric prompt contrast excretion within the proximal urinary collecting system. Urinary bladder is decompressed by Foley catheter. 19 mm LEFT adrenal nodule (20 Hounsfield on early phase, 15 Hounsfield units on delayed phase). STOMACH/BOWEL: The stomach, small and large bowel are normal in course and caliber without inflammatory changes. Mild colonic diverticulosis. VASCULAR/LYMPHATIC: Mildly ectatic infrarenal aorta with moderate calcific atherosclerosis. No lymphadenopathy by CT size criteria. REPRODUCTIVE: In transaxial Prostatomegaly, prostate dimension is. 7.4 cm OTHER: Minimal free fluid in the pelvis may be physiologic. MUSCULOSKELETAL: Nonacute. Small fat containing inguinal hernias. RIGHT anterior abdominal wall scarring. Osteopenia. Ankylosis of the sacroiliac joints. Old RIGHT L3 transverse process fracture. Multiple Schmorl's nodes. Advanced degenerative changes spine. IMPRESSION: 1. Possible filling defects LEFT atrium. Given probable recent stroke, these are suspicious for clot. 2. Prostatomegaly. Decompressed urinary bladder with Foley catheter. No obstructive uropathy. 3. **An incidental finding of potential clinical significance has been found. 19 mm LEFT adrenal nodule, likely benign. Consider 12 month follow-up CT adrenal protocol. At the time recommend close attention to complex RIGHT renal cyst.** Aortic Atherosclerosis (ICD10-I70.0). Electronically Signed   By: Elon Alas M.D.   On: 07/25/2017 05:11   US Renal  Result Date: 07/13/2017 CLINICAL DATA:  Acute onset of renal insufficiency. EXAM: RENAL / URINARY TRACT ULTRASOUND COMPLETE COMPARISON:  None. FINDINGS: Right Kidney: Length: 10.9 cm. Echogenicity within normal limits. A mildly complex cystic lesion is noted at the lower pole of the right  kidney, measuring 5.0 x 3.6 x 3.6 cm. This contains a few septations. Additional cysts are noted at the right kidney, measuring up to 4.6 cm in size. No hydronephrosis is seen. Left Kidney: Length: 11.3 cm. Echogenicity within normal limits. Left renal cysts measure up to 1.9 cm in size. No hydronephrosis visualized. Bladder: A Foley catheter is noted within the bladder. Debris is seen dependently within the bladder. Bladder wall thickening may reflect cystitis or chronic inflammation. IMPRESSION: 1. Bladder wall thickening may reflect cystitis or chronic inflammation. Debris noted dependently within the bladder. 2. Mildly complex cystic lesion at the lower pole of the right kidney, measuring 5.0 cm. This contains a few septations. Would correlate with any prior available imaging. 3. No evidence of hydronephrosis. 4. Bilateral renal cysts noted. Electronically Signed   By: Garald Balding M.D.   On: 07/13/2017 23:31   Dg Chest Port 1 View  Result Date: 08/03/2017 CLINICAL DATA:  Encounter for PICC line placement. EXAM: PORTABLE CHEST 1 VIEW COMPARISON:  August 02, 2017 FINDINGS: The mediastinal contour is normal. The heart size is enlarged. A right PICC line is identified with distal tip probably at the superior vena cava right atrial junction although evaluation is limited by under penetrated technique. Mild pulmonary interstitial prominence is unchanged. There is no focal pneumonia or pleural effusion. No pneumothorax is noted. The visualized skeletal structures are stable. IMPRESSION: Right PICC line with distal tip probably in the superior vena cava/right atrial junction. There is no pneumothorax. Electronically Signed   By: Abelardo Diesel M.D.   On: 08/03/2017 18:50   Dg Chest Portable 1 View  Result Date: 08/02/2017 CLINICAL DATA:  Fever with urinary retention EXAM: PORTABLE CHEST 1 VIEW COMPARISON:  07/25/2017 FINDINGS: Stable cardiomegaly with aortic atherosclerosis. Chronic mild interstitial prominence  without alveolar confluence or overt pulmonary edema. Slight blunting the right lateral costophrenic angle may reflect a tiny right  effusion. No acute osseous abnormality. The patient's chin obscures the right lung apex. IMPRESSION: Stable cardiomegaly with aortic atherosclerosis. Chronic mild interstitial prominence probable trace right effusion. Electronically Signed   By: Ashley Royalty M.D.   On: 08/02/2017 02:03   Dg Chest Port 1 View  Result Date: 07/25/2017 CLINICAL DATA:  [address] [address] hit head, shortness of breath tonight. History of CHF. EXAM: PORTABLE CHEST 1 VIEW COMPARISON:  Chest radiograph July 14, 2017 FINDINGS: Cardiac silhouette is moderately enlarged. Calcified aortic arch. Chronic interstitial changes LEFT pleural effusion focal consolidation. No pneumothorax. Osteopenia. Soft tissue planes are non suspicious. IMPRESSION: Stable cardiomegaly and mild chronic interstitial changes. Aortic Atherosclerosis (ICD10-I70.0). Electronically Signed   By: Elon Alas M.D.   On: 07/25/2017 00:53   Dg Chest Port 1 View  Result Date: 07/13/2017 CLINICAL DATA:  Fever, congestion, shortness of breath. EXAM: PORTABLE CHEST 1 VIEW COMPARISON:  Chest x-ray dated September 07, 2016. FINDINGS: The patient is rotated to the left. Mild cardiomegaly. Atherosclerotic calcification of the aortic arch. Stable linear scarring/atelectasis at the lung bases. No focal consolidation, pleural effusion, or pneumothorax. No acute osseous abnormality. IMPRESSION: No active cardiopulmonary disease. Electronically Signed   By: Titus Dubin M.D.   On: 07/13/2017 17:06   [place] Ekg Site Rite  Result Date: 08/03/2017 If Site Rite image not attached, placement could not be confirmed due to current cardiac rhythm.   Microbiology: Recent Results (from the past 240 hour(s))  Urine Culture     Status: Abnormal   Collection Time: 07/25/17 10:08 PM  Result Value Ref Range Status   Specimen Description URINE, RANDOM  Final   Special  Requests   Final    NONE Performed at [facility], [address] [place], [place] [address]    Culture 40,000 COLONIES/mL PSEUDOMONAS AERUGINOSA (A)  Final   Report Status 07/28/2017 FINAL  Final   Organism ID, Bacteria PSEUDOMONAS AERUGINOSA (A)  Final      Susceptibility   Pseudomonas aeruginosa - MIC*    CEFTAZIDIME 4 SENSITIVE Sensitive     CIPROFLOXACIN >=4 RESISTANT Resistant     GENTAMICIN >=16 RESISTANT Resistant     IMIPENEM >=16 RESISTANT Resistant     PIP/TAZO 32 SENSITIVE Sensitive     CEFEPIME 8 SENSITIVE Sensitive     * 40,000 COLONIES/mL PSEUDOMONAS AERUGINOSA  Culture, blood (routine x 2)     Status: None   Collection Time: 07/26/17  6:40 PM  Result Value Ref Range Status   Specimen Description BLOOD LEFT ANTECUBITAL  Final   Special Requests   Final    BOTTLES DRAWN AEROBIC AND ANAEROBIC Blood Culture adequate volume   Culture   Final    NO GROWTH 5 DAYS Performed at [facility], [address], [place], [place] [address]    Report Status 07/31/2017 FINAL  Final  Culture, blood (routine x 2)     Status: None   Collection Time: 07/26/17  6:50 PM  Result Value Ref Range Status   Specimen Description BLOOD RIGHT HAND  Final   Special Requests   Final    BOTTLES DRAWN AEROBIC AND ANAEROBIC Blood Culture results may not be optimal due to an inadequate volume of blood received in culture bottles   Culture   Final    NO GROWTH 5 DAYS Performed at [facility], [place] [address] [place], [place] [address]    Report Status 07/31/2017 FINAL  Final  Blood Culture (routine x 2)  Status: None (Preliminary result)   Collection Time: 08/01/17 10:30 PM  Result Value Ref Range Status   Specimen Description   Final    BLOOD RIGHT ANTECUBITAL Performed at [facility] [address], [place], [place] [address]    Special Requests   Final    BOTTLES DRAWN AEROBIC AND ANAEROBIC Blood Culture adequate volume Performed at [facility] [address], [place], [place] [address]    Culture   Final    NO GROWTH 2 DAYS Performed at [facility] [address], [place], [place] [address]    Report Status PENDING  Incomplete  Blood Culture (routine x 2)     Status: None (Preliminary result)   Collection Time: 08/01/17 10:30 PM  Result Value Ref Range Status   Specimen Description   Final    BLOOD LEFT HAND Performed at [facility] [address] [place], [place] [address]    Special Requests   Final    BOTTLES DRAWN AEROBIC AND ANAEROBIC Blood Culture results may not be optimal due to an excessive volume of blood received in culture bottles Performed at [facility] [address], [place], [place] [address]    Culture   Final    NO GROWTH 2 DAYS Performed at [facility] [address], [place], [place] [address]    Report Status PENDING  Incomplete  Urine Culture     Status: Abnormal (Preliminary result)   Collection Time: 08/02/17  1:39 AM  Result Value Ref Range Status   Specimen Description   Final    URINE, CATHETERIZED Performed at [facility] [address] [place], [place] [address]    Special Requests   Final    NONE Performed at [facility], [place] [address] [place], [place] [address]    Culture 40,000 COLONIES/mL PSEUDOMONAS AERUGINOSA (A)  Final   Report Status PENDING  Incomplete     Labs: Basic Metabolic Panel: Recent Labs  Lab 08/01/17 2230 08/02/17 0710 08/03/17 0355  NA 137 140 141  K 3.8 4.2 4.4  CL 107 108 111  CO2 _0 GLUCOSE 149* 115* 105*  BUN 31* 28* 27*  CREATININE 1.23 1.14 1.14  CALCIUM 9.9 9.7 9.8  MG  --   --  2.3   Liver Function Tests: Recent Labs  Lab 08/01/17 2230 08/02/17 0710 08/03/17 0355  AST 97* 69* 81*  ALT 94* 79* 85*  ALKPHOS 186* 160* 152*  BILITOT 1.1 0.6 0.9  PROT 7.2 6.8 6.5  ALBUMIN 3.0* 2.7* 2.7*   No results for  input(s): LIPASE, AMYLASE in the last 168 hours. No results for input(s): AMMONIA in the last 168 hours. CBC: Recent Labs  Lab 07/29/17 0353 08/01/17 2230 08/02/17 0710 08/03/17 0355  WBC 13.7* 10.6* 10.7* 8.6  NEUTROABS  --  8.4*  --  5.6  HGB 12.0* 12.8* 11.9* 12.4*  HCT 36.9* 38.6* 35.3* 37.3*  MCV 99.2 97.5 98.3 96.9  PLT 191 297 283 311   Cardiac Enzymes: No results for input(s): CKTOTAL, CKMB, CKMBINDEX, TROPONINI in the last 168 hours. BNP: BNP (last 3 results) Recent Labs    07/25/17 0051  BNP 165.4*    ProBNP (last 3 results) No results for input(s): PROBNP in the last 8760 hours.  CBG: No results for input(s): GLUCAP in the last 168 hours.     Signed:  Cristal Ford  [facility] 08/04/2017, 3:09 PM

## 2017-08-03 NOTE — Evaluation (Signed)
Occupational Therapy Evaluation Patient Details Name: [name] MRN: [id number] DOB: 12-19-1925 Today's Date: 08/03/2017    History of Present Illness Pt was admitted for complicated UTI/gross hematuria.    H/o recurrent UTIs, CVA x 4, vascular dementia, CKD, and CAD   Clinical Impression   This 84 year old man was admitted for the above.  He was recently admitted and d/c'd to SNF for rehab.  Will follow in acute setting with min to mod A level goals.  He needs mod +2 for mobility and up to max +2 for LB adls.  He will need to return to SNF for rehab prior to home    Follow Up Recommendations  SNF    Equipment Recommendations  3 in 1 bedside commode    Recommendations for Other Services       Precautions / Restrictions Precautions Precautions: Fall Restrictions Weight Bearing Restrictions: No      Mobility Bed Mobility         Supine to sit: Mod assist;+2 for physical assistance     General bed mobility comments: assist for legs and trunk.    Transfers                      Balance     Sitting balance-Leahy Scale: Fair       Standing balance-Leahy Scale: Poor                             ADL either performed or assessed with clinical judgement   ADL   Eating/Feeding: Set up   Grooming: Set up   Upper Body Bathing: Minimal assistance   Lower Body Bathing: Moderate assistance;+2 for physical assistance;Sit to/from stand   Upper Body Dressing : Minimal assistance;Sitting   Lower Body Dressing: Maximal assistance;+2 for physical assistance;Sit to/from stand                 General ADL Comments: +2 for sit to stand.      Vision         Perception     Praxis      Pertinent Vitals/Pain Pain Assessment: No/denies pain     Hand Dominance     Extremity/Trunk Assessment Upper Extremity Assessment Upper Extremity Assessment: Overall WFL for tasks assessed(grossly 4/5)           Communication  Communication Communication: No difficulties   Cognition Arousal/Alertness: Awake/alert Behavior During Therapy: WFL for tasks assessed/performed Overall Cognitive Status: Impaired/Different from baseline Area of Impairment: Orientation;Following commands;Safety/judgement;Problem solving                       Following Commands: Follows one step commands with increased time(multimodal cues) Safety/Judgement: Decreased awareness of safety;Decreased awareness of deficits   Problem Solving: Slow processing;Difficulty sequencing;Requires verbal cues;Requires tactile cues     General Comments       Exercises     Shoulder Instructions      Home Living Family/patient expects to be discharged to:: Skilled nursing facility                                 Additional Comments: had been at [facility] for a few days prior to this admission  Lives With: Spouse    Prior Functioning/Environment    Gait / Transfers Assistance Needed: using rollator for ambulating household  distances; often sits on rollator and pedals with his feet for longer distance     Comments: prior to SNF, pt was independent dressing. Wife assisted with bathing        OT Problem List: Decreased strength;Impaired balance (sitting and/or standing);Decreased cognition;Decreased activity tolerance;Decreased knowledge of use of DME or AE      OT Treatment/Interventions: Self-care/ADL training;DME and/or AE instruction;Therapeutic activities;Balance training;Patient/family education;Therapeutic exercise    OT Goals(Current goals can be found in the care plan section) Acute Rehab OT Goals Patient Stated Goal: none stated; agreeable to therapy OT Goal Formulation: With patient/family Time For Goal Achievement: 08/17/17 Potential to Achieve Goals: Good ADL Goals Pt Will Perform Grooming: with min assist;standing Pt Will Perform Lower Body Dressing: with mod assist;sit to/from stand Pt  Will Transfer to Toilet: with min assist;ambulating;bedside commode Pt Will Perform Toileting - Clothing Manipulation and hygiene: with min assist;sit to/from stand  OT Frequency: Min 2X/week   Barriers to D/C:            Co-evaluation PT/OT/SLP Co-Evaluation/Treatment: Yes Reason for Co-Treatment: Complexity of the patient's impairments (multi-system involvement) PT goals addressed during session: Mobility/safety with mobility OT goals addressed during session: ADL's and self-care      AM-PAC PT "6 Clicks" Daily Activity     Outcome Measure Help from another person eating meals?: A Little Help from another person taking care of personal grooming?: A Little Help from another person toileting, which includes using toliet, bedpan, or urinal?: A Lot Help from another person bathing (including washing, rinsing, drying)?: A Lot Help from another person to put on and taking off regular upper body clothing?: A Little Help from another person to put on and taking off regular lower body clothing?: A Lot 6 Click Score: 15   End of Session Nurse Communication: Mobility status  Activity Tolerance: Patient tolerated treatment well;Patient limited by fatigue Patient left: with family/visitor present;in chair;with call bell/phone within reach;with chair alarm set  OT Visit Diagnosis: Unsteadiness on feet (R26.81);Muscle weakness (generalized) (M62.81)                Time: 0981-1914 OT Time Calculation (min): 24 min Charges:  OT General Charges $OT Visit: 1 Visit OT Evaluation $OT Eval Moderate Complexity: 1 Mod G-Codes:     [place], OTR/L [phone] 08/03/2017  [name] 08/03/2017, 12:31 PM

## 2017-08-03 NOTE — Progress Notes (Signed)
PROGRESS NOTE    [name]  ZOX:[id number] DOB: Apr 12, 1925 DOA: 08/01/2017 PCP: Eustaquio Boyden, MD   Brief Narrative:  On 08/02/2017 by Dr. Lyda Perone Voyd Groft Davisis a 84 y.o.malewith medical history significant ofa.fib on eliquis, vascular dementia, prior strokes. Patient recently admitted to our service from 6/6 to 6/8 and then from 6/17 to 6/21. Patient had UTI with E.Coli, klebsellia, and pseudomonas bacteremia during the 6/6 admit. Pseudomonas UTI during the 6/17 admit with an MDRO organism. He also had small R posterior frontal infarct, likely embolic from a ? L atrial mural thrombus secondary to stopping anticoagulation for a period of time due to hematuria. Additionally he had acute RLE DVT and B PEs demonstrated on CT scan. He was discharged on 6/21 on eliquis (for the numerous thromboembolic issues noted above). Today he had urinary retention at the SNF. They were unable to clear foley. Sent in for this.  Assessment & Plan   CAUTI with recent MDRO pseudomonas   -On admission, no leukocytosis or fever -Family stated he had altered mental status at the nursing facility -Patient also with hematuria however on Eliquis -Blood cultures pending -Urine culture 40K Pseudomonas aeruginosa -Urine culture on 07/25/2017 showed 40K pseudomonas with drug resistance -Urology consulted and appreciated -Feel that patient is likely colonized at this time however will treat given his altered mental status -PICC line ordered for IV antibiotic continuation -Palliative care consulted and appreciated for multiple admissions and patient's failure to thrive  Atrial fibrillation -Continue Eliquis, metoprolol  Embolic CVA/ Bilateral PE/ RLE DVT -Continue Eliquis  Transaminitis -RUQ [place]: Unremarkable -Continue to monitor   Goals of care -Currently DNR -palliative care consulted and appreciated   Physical deconditioning -PT/OT recommended SNF  DVT  Prophylaxis  Eliquis  Code Status: DNR  Family Communication: Family at bedside  Disposition Plan: Admitted, pending SNF  Consultants Palliative care  Urology  Procedures  RUQ [place]  Antibiotics   Anti-infectives (From admission, onward)   Start     Dose/Rate Route Frequency Ordered Stop   08/02/17 2200  ceFEPIme (MAXIPIME) 2 g in sodium chloride 0.9 % 100 mL IVPB  Status:  Discontinued     2 g 200 mL/hr over 30 Minutes Intravenous Every 24 hours 08/02/17 0140 08/02/17 1115   08/02/17 1200  ceFEPIme (MAXIPIME) 2 g in sodium chloride 0.9 % 100 mL IVPB     2 g 200 mL/hr over 30 Minutes Intravenous Every 12 hours 08/02/17 1115     08/02/17 0130  ceFEPIme (MAXIPIME) 2 g in sodium chloride 0.9 % 100 mL IVPB     2 g 200 mL/hr over 30 Minutes Intravenous  Once 08/02/17 0117 08/02/17 0207   08/01/17 2315  cefTRIAXone (ROCEPHIN) 2 g in sodium chloride 0.9 % 100 mL IVPB  Status:  Discontinued     2 g 200 mL/hr over 30 Minutes Intravenous Every 24 hours 08/01/17 2303 08/02/17 0113      Subjective:   [name] seen and examined today.  Denies chest pain, shortness of breath, abdominal pain, N/V/D/C.   Objective:   Vitals:   08/02/17 1453 08/02/17 2148 08/03/17 0538 08/03/17 1300  BP: 136/73 (!) 152/86 (!) 145/84 133/62  Pulse: 63 98 65 (!) 127  Resp: 20 15 14 16   Temp: 98.5 F (36.9 C) 98.1 F (36.7 C) 99.3 F (37.4 C) 97.8 F (36.6 C)  TempSrc: Oral Oral Oral Oral  SpO2: 95% 95% 97% 100%  Weight:      Height:  Intake/Output Summary (Last 24 hours) at 08/03/2017 1628 Last data filed at 08/03/2017 1600 Gross per 24 hour  Intake 920 ml  Output 1500 ml  Net -580 ml   Filed Weights   08/01/17 2203 08/01/17 2206  Weight: 94.8 kg (209 lb) 99.8 kg (220 lb)    Exam  General: Well developed, well nourished, NAD, appears stated age  HEENT: NCAT, mucous membranes moist.   Neck: Supple  Cardiovascular: S1 S2 auscultated, irregular  Respiratory: Clear to  auscultation bilaterally with equal chest rise  Abdomen: Soft, nontender, nondistended, + bowel sounds  Extremities: warm dry without cyanosis clubbing or edema  Neuro: AAOx3, nonfocal  Psych: Normal affect and demeanor with intact judgement and insight   Data Reviewed: I have personally reviewed following labs and imaging studies  CBC: Recent Labs  Lab 07/28/17 0502 07/29/17 0353 08/01/17 2230 08/02/17 0710 08/03/17 0355  WBC 13.1* 13.7* 10.6* 10.7* 8.6  NEUTROABS  --   --  8.4*  --  5.6  HGB 12.1* 12.0* 12.8* 11.9* 12.4*  HCT 37.5* 36.9* 38.6* 35.3* 37.3*  MCV 100.0 99.2 97.5 98.3 96.9  PLT 186 191 297 283 311   Basic Metabolic Panel: Recent Labs  Lab 07/28/17 0502 08/01/17 2230 08/02/17 0710 08/03/17 0355  NA 141 137 140 141  K 3.9 3.8 4.2 4.4  CL 108 107 108 111  CO2 26 22 25 25   GLUCOSE 123* 149* 115* 105*  BUN 30* 31* 28* 27*  CREATININE 1.42* 1.23 1.14 1.14  CALCIUM 10.2 9.9 9.7 9.8  MG  --   --   --  2.3   GFR: Estimated Creatinine Clearance: 53.7 mL/min (by C-G formula based on SCr of 1.14 mg/dL). Liver Function Tests: Recent Labs  Lab 08/01/17 2230 08/02/17 0710 08/03/17 0355  AST 97* 69* 81*  ALT 94* 79* 85*  ALKPHOS 186* 160* 152*  BILITOT 1.1 0.6 0.9  PROT 7.2 6.8 6.5  ALBUMIN 3.0* 2.7* 2.7*   No results for input(s): LIPASE, AMYLASE in the last 168 hours. No results for input(s): AMMONIA in the last 168 hours. Coagulation Profile: Recent Labs  Lab 08/01/17 2304  INR 1.59   Cardiac Enzymes: No results for input(s): CKTOTAL, CKMB, CKMBINDEX, TROPONINI in the last 168 hours. BNP (last 3 results) No results for input(s): PROBNP in the last 8760 hours. HbA1C: No results for input(s): HGBA1C in the last 72 hours. CBG: No results for input(s): GLUCAP in the last 168 hours. Lipid Profile: No results for input(s): CHOL, HDL, LDLCALC, TRIG, CHOLHDL, LDLDIRECT in the last 72 hours. Thyroid Function Tests: No results for input(s): TSH,  T4TOTAL, FREET4, T3FREE, THYROIDAB in the last 72 hours. Anemia Panel: No results for input(s): VITAMINB12, FOLATE, FERRITIN, TIBC, IRON, RETICCTPCT in the last 72 hours. Urine analysis:    Component Value Date/Time   COLORURINE YELLOW 08/01/2017 2212   APPEARANCEUR HAZY (A) 08/01/2017 2212   LABSPEC 1.014 08/01/2017 2212   PHURINE 6.0 08/01/2017 2212   GLUCOSEU NEGATIVE 08/01/2017 2212   HGBUR LARGE (A) 08/01/2017 2212   BILIRUBINUR NEGATIVE 08/01/2017 2212   KETONESUR NEGATIVE 08/01/2017 2212   PROTEINUR 30 (A) 08/01/2017 2212   UROBILINOGEN 0.2 03/24/2010 1755   NITRITE NEGATIVE 08/01/2017 2212   LEUKOCYTESUR SMALL (A) 08/01/2017 2212   Sepsis Labs: @LABRCNTIP (procalcitonin:4,lacticidven:4)  ) Recent Results (from the past 240 hour(s))  Urine Culture     Status: Abnormal   Collection Time: 07/25/17 10:08 PM  Result Value Ref Range Status   Specimen Description  URINE, RANDOM  Final   Special Requests   Final    NONE Performed at [facility], [address] [place], [place] [address]    Culture 40,000 COLONIES/mL PSEUDOMONAS AERUGINOSA (A)  Final   Report Status 07/28/2017 FINAL  Final   Organism ID, Bacteria PSEUDOMONAS AERUGINOSA (A)  Final      Susceptibility   Pseudomonas aeruginosa - MIC*    CEFTAZIDIME 4 SENSITIVE Sensitive     CIPROFLOXACIN >=4 RESISTANT Resistant     GENTAMICIN >=16 RESISTANT Resistant     IMIPENEM >=16 RESISTANT Resistant     PIP/TAZO 32 SENSITIVE Sensitive     CEFEPIME 8 SENSITIVE Sensitive     * 40,000 COLONIES/mL PSEUDOMONAS AERUGINOSA  Culture, blood (routine x 2)     Status: None   Collection Time: 07/26/17  6:40 PM  Result Value Ref Range Status   Specimen Description BLOOD LEFT ANTECUBITAL  Final   Special Requests   Final    BOTTLES DRAWN AEROBIC AND ANAEROBIC Blood Culture adequate volume   Culture   Final    NO GROWTH 5 DAYS Performed at [facility], [address] [place], [place] [address]    Report Status  07/31/2017 FINAL  Final  Culture, blood (routine x 2)     Status: None   Collection Time: 07/26/17  6:50 PM  Result Value Ref Range Status   Specimen Description BLOOD RIGHT HAND  Final   Special Requests   Final    BOTTLES DRAWN AEROBIC AND ANAEROBIC Blood Culture results may not be optimal due to an inadequate volume of blood received in culture bottles   Culture   Final    NO GROWTH 5 DAYS Performed at [facility], [address] [place], [place] [address]    Report Status 07/31/2017 FINAL  Final  Blood Culture (routine x 2)     Status: None (Preliminary result)   Collection Time: 08/01/17 10:30 PM  Result Value Ref Range Status   Specimen Description   Final    BLOOD RIGHT ANTECUBITAL Performed at [facility], [address] [place], [place] [address]    Special Requests   Final    BOTTLES DRAWN AEROBIC AND ANAEROBIC Blood Culture adequate volume Performed at [facility], [address] [place], [place] [address]    Culture   Final    NO GROWTH 1 DAY Performed at [facility], [address] [place], [place] [address]    Report Status PENDING  Incomplete  Blood Culture (routine x 2)     Status: None (Preliminary result)   Collection Time: 08/01/17 10:30 PM  Result Value Ref Range Status   Specimen Description   Final    BLOOD LEFT HAND Performed at [facility], [address] [place], [place] [address]    Special Requests   Final    BOTTLES DRAWN AEROBIC AND ANAEROBIC Blood Culture results may not be optimal due to an excessive volume of blood received in culture bottles Performed at [facility], [address] [place], [place] [address]    Culture   Final    NO GROWTH 1 DAY Performed at [facility], [address] [place], [place] [address]    Report Status PENDING  Incomplete  Urine Culture     Status: Abnormal (Preliminary result)   Collection Time: 08/02/17   1:39 AM  Result Value Ref Range Status  Specimen Description   Final    URINE, CATHETERIZED Performed at [facility], [address] [place], [place] [address]    Special Requests   Final    NONE Performed at [facility], [address] [place], [place] [address]    Culture 40,000 COLONIES/mL PSEUDOMONAS AERUGINOSA (A)  Final   Report Status PENDING  Incomplete      Radiology Studies: US Abdomen Complete  Result Date: 08/02/2017 CLINICAL DATA:  Transaminitis and sepsis. EXAM: ABDOMEN ULTRASOUND COMPLETE COMPARISON:  CT 07/25/2017 FINDINGS: Gallbladder: No gallstones or wall thickening visualized. No sonographic Murphy sign noted by sonographer. Common bile duct: Diameter: 3.6 mm and normal. Liver: No focal lesion identified. Within normal limits in parenchymal echogenicity. No intrahepatic ductal dilatation. Portal vein is patent on color Doppler imaging with normal direction of blood flow towards the liver. IVC: No abnormality visualized. Pancreas: Visualized portion unremarkable. Spleen: Size and appearance within normal limits. Right Kidney: Length: 12.3 cm. Echogenicity within normal limits. Exophytic cyst arising from the interpolar and lower pole of the right kidney measuring 5.7 x 2.4 x 3.3 cm and 4.3 x 2.3 x 3.6 cm. These contain low-level internal echoes. No focal mural nodules. No septations. Left Kidney: Length: 10.7 cm. Echogenicity within normal limits. No mass or hydronephrosis visualized. Small cysts measuring 1.8 x 1.4 x 1 cm in the upper pole and 1.4 x 1.4 x 1.2 cm are noted simple in appearance. Abdominal aorta: No aneurysm visualized. Limited assessment due to overlying bowel. Other findings: Urinary bladder is decompressed by Foley catheter. IMPRESSION: 1. Unremarkable appearance of the liver and gallbladder. No findings for the patient's transaminitis. 2. Decompressed urinary bladder with Foley catheter. Bilateral renal cysts.  Electronically Signed   By: Tollie Eth M.D.   On: 08/02/2017 03:34   Dg Chest Portable 1 View  Result Date: 08/02/2017 CLINICAL DATA:  Fever with urinary retention EXAM: PORTABLE CHEST 1 VIEW COMPARISON:  07/25/2017 FINDINGS: Stable cardiomegaly with aortic atherosclerosis. Chronic mild interstitial prominence without alveolar confluence or overt pulmonary edema. Slight blunting the right lateral costophrenic angle may reflect a tiny right effusion. No acute osseous abnormality. The patient's chin obscures the right lung apex. IMPRESSION: Stable cardiomegaly with aortic atherosclerosis. Chronic mild interstitial prominence probable trace right effusion. Electronically Signed   By: Tollie Eth M.D.   On: 08/02/2017 02:03   [place] Ekg Site Rite  Result Date: 08/03/2017 If Site Rite image not attached, placement could not be confirmed due to current cardiac rhythm.    Scheduled Meds: . allopurinol  100 mg Oral Daily  . apixaban  10 mg Oral BID  . [START ON 08/04/2017] apixaban  5 mg Oral BID  . atorvastatin  80 mg Oral q1800  . digoxin  125 mcg Oral QODAY  . finasteride  5 mg Oral Daily  . metoprolol succinate  12.5 mg Oral Daily   Continuous Infusions: . ceFEPime (MAXIPIME) IV Stopped (08/03/17 1131)     LOS: 1 day   Time Spent in minutes   45 minutes  Orlandis Sanden D.O. on 08/03/2017 at 4:28 PM  Between 7am to 7pm - Pager - [phone]  After 7pm go to [email or web address] - password [email or web address]  And look for the night coverage person covering for me after hours  [facility] Office  [phone]

## 2017-08-03 NOTE — Progress Notes (Signed)
Peripherally Inserted Central Catheter/Midline Placement  The IV Nurse has discussed with the patient and/or persons authorized to consent for the patient, the purpose of this procedure and the potential benefits and risks involved with this procedure.  The benefits include less needle sticks, lab draws from the catheter, and the patient may be discharged home with the catheter. Risks include, but not limited to, infection, bleeding, blood clot (thrombus formation), and puncture of an artery; nerve damage and irregular heartbeat and possibility to perform a PICC exchange if needed/ordered by physician.  Alternatives to this procedure were also discussed.  Bard Power PICC patient education guide, fact sheet on infection prevention and patient information card has been provided to patient /or left at bedside.    PICC/Midline Placement Documentation  PICC Single Lumen 08/03/17 PICC Right Brachial 42 cm 0 cm (Active)  Indication for Insertion or Continuance of Line Home intravenous therapies (PICC only) 08/03/2017  5:00 PM  Exposed Catheter (cm) 0 cm 08/03/2017  5:00 PM  Site Assessment Clean;Dry;Intact 08/03/2017  5:00 PM  Line Status Flushed;Saline locked;Blood return noted 08/03/2017  5:00 PM  Dressing Type Transparent;Securing device 08/03/2017  5:00 PM  Dressing Status Clean;Dry;Intact;Antimicrobial disc in place 08/03/2017  5:00 PM  Dressing Change Due 08/10/17 08/03/2017  5:00 PM       Romie Jumperlford, Daren Yeagle Terry 08/03/2017, 5:21 PM

## 2017-08-03 NOTE — Consult Note (Signed)
Consultation Note Date: 08/03/2017   Patient Name: [name]  DOB: 01-17-1926  MRN: [id number]  Age / Sex: 84 y.o., male  PCP: Ria Bush, MD Referring Physician: Cristal Ford, DO  Reason for Consultation: Establishing goals of care  HPI/Patient Profile: 84 y.o. male     admitted on 08/01/2017    Clinical Assessment and Goals of Care:  84 year old gentleman with hematuria, suspected recurrent UTIs, urinary retention, history of bilateral renal cysts. The patient was living at home with his wife, he has had gradual progressive functional decline for the past few months, after a recent hospitalization, he was sent to SNF rehab.   Patient has been admitted this time with hematuria/blood clots in the Foley catheter. A palliative consult has been placed for goals of care discussions.   I met with the patient and his wife, who was at the bedside. I introduced myself and palliative care as follows: Palliative medicine is specialized medical care for people living with serious illness. It focuses on providing relief from the symptoms and stress of a serious illness. The goal is to improve quality of life for both the patient and the family.  Patient's wife is a former LPN, she is his primary caregiver. She states that after a rehab attempt is completed, she would like to take him home, she states that her biggest worry is that the patient is "giving up".   We discussed about acute issues pertaining to this hospitalization, goals, wishes and values discussed, see below.     NEXT OF KIN  wife [name] at [phone].   SUMMARY OF RECOMMENDATIONS    Agree with DNR Agree with SNF: patient was recently at [facility], recommend SNF rehab attempt with palliative care following over there.  Continue current mode of care for now. Discussed with wife at bedside.  Code Status/Advance Care  Planning:  DNR    Symptom Management:    as above   Palliative Prophylaxis:   Bowel Regimen   Psycho-social/Spiritual:   Desire for further Chaplaincy support:yes  Additional Recommendations: Caregiving  Support/Resources  Prognosis:   < 12 months  Discharge Planning: [place] for rehab with Palliative care service follow-up      Primary Diagnoses: Present on Admission: . Complicated UTI (urinary tract infection) . ? Mural thrombus of left atrium . Permanent atrial fibrillation ([place]) . Bilateral pulmonary embolism ([place]) . Transaminitis   I have reviewed the medical record, interviewed the patient and family, and examined the patient. The following aspects are pertinent.  Past Medical History:  Diagnosis Date  . Atrial fibrillation (HCC)    chronic  . BPH (benign prostatic hyperplasia)   . CAD (coronary artery disease)    s/p MI, unsure when  . Chronic diastolic heart failure ([place])   . Chronic indwelling Foley catheter   . CKD (chronic kidney disease), stage III ([place])   . Cognitive impairment    from stroke  . CVA (cerebrovascular accident) ([place]) x4 (latest 6808)   embolic,  residual cognitive impairment  . DOE (dyspnea on exertion)   . Gout   . HTN (hypertension)   . Hyperlipidemia    Social History   Socioeconomic History  . Marital status: Single    Spouse name: Not on file  . Number of children: Not on file  . Years of education: Not on file  . Highest education level: Not on file  Occupational History  . Not on file  Social Needs  . Financial resource strain: Not on file  . Food insecurity:    Worry: Not on file    Inability: Not on file  . Transportation needs:    Medical: Not on file    Non-medical: Not on file  Tobacco Use  . Smoking status: Former Smoker    Last attempt to quit: 02/08/1950    Years since quitting: 67.5  . Smokeless tobacco: Never Used  Substance and Sexual Activity  . Alcohol use: No  . Drug use:  No  . Sexual activity: Not on file  Lifestyle  . Physical activity:    Days per week: Not on file    Minutes per session: Not on file  . Stress: Not on file  Relationships  . Social connections:    Talks on phone: Not on file    Gets together: Not on file    Attends religious service: Not on file    Active member of club or organization: Not on file    Attends meetings of clubs or organizations: Not on file    Relationship status: Not on file  Other Topics Concern  . Not on file  Social History Narrative   Lives with wife   2 grown children live ([place] and [place]). Estranged from local son. Not close to sons. Sees grandchildren regularly.   Occupation: retired, prior Technical sales engineer   Activity:    Diet:    Family History  Problem Relation Age of Onset  . Cancer Brother        brain  . Cancer Sister        colon  . CAD Sister   . Alcohol abuse Brother    Scheduled Meds: . allopurinol  100 mg Oral Daily  . apixaban  10 mg Oral BID  . [START ON 08/04/2017] apixaban  5 mg Oral BID  . atorvastatin  80 mg Oral q1800  . digoxin  125 mcg Oral QODAY  . finasteride  5 mg Oral Daily  . metoprolol succinate  12.5 mg Oral Daily   Continuous Infusions: . ceFEPime (MAXIPIME) IV Stopped (08/03/17 1131)   PRN Meds:.acetaminophen **OR** acetaminophen, hydrocortisone cream, ondansetron **OR** ondansetron (ZOFRAN) IV Medications Prior to Admission:  Prior to Admission medications   Medication Sig Start Date End Date Taking? Authorizing Provider  allopurinol (ZYLOPRIM) 100 MG tablet Take 1 tablet (100 mg total) by mouth daily. 02/21/17  Yes Ria Bush, MD  atorvastatin (LIPITOR) 80 MG tablet Take 1 tablet (80 mg total) by mouth daily at 6 PM. 07/28/17  Yes Ghimire, Henreitta Leber, MD  digoxin (LANOXIN) 0.125 MG tablet Take 1 tablet (125 mcg total) by mouth every other day. 01/31/17  Yes Nahser, Wonda Cheng, MD  finasteride (PROSCAR) 5 MG tablet Take 5 mg by mouth daily.   Yes  [provider]  furosemide (LASIX) 40 MG tablet TAKEONE TABLETS BY MOUTH ONCE DAILY IN THE MORNING 07/28/17  Yes Ghimire, Henreitta Leber, MD  metoprolol succinate (TOPROL-XL) 25 MG 24 hr tablet TAKE ONE-HALF TABLET  BY MOUTH ONCE DAILY Patient taking differently: Take 12.5 mg by mouth daily. TAKE ONE-HALF TABLET BY MOUTH ONCE DAILY 01/06/17  Yes Nahser, Wonda Cheng, MD  nitroGLYCERIN (NITROSTAT) 0.4 MG SL tablet Place 0.4 mg under the tongue every 5 (five) minutes as needed (for chest pain).    Yes [provider]  Omega-3 Fatty Acids (FISH OIL) 1000 MG CAPS Take 1,000 mg by mouth daily.    Yes [provider]  apixaban (ELIQUIS) 5 MG TABS tablet Take 1 tablet (5 mg total) by mouth 2 (two) times daily. Starting ON 6/27 AT 10 AM Patient not taking: Reported on 08/01/2017 08/04/17   Jonetta Osgood, MD   Allergies  Allergen Reactions  . Benazepril Hcl Other (See Comments)    Lethargic, nervous, nauseated   Review of Systems Denies pain  Physical Exam Asleep for most of the encounter Is able to open eyes, say Hi, is able to state his name In no distress Regular S1 S2 Abdomen is soft not distended No edema Has generalized weakness  Vital Signs: BP 133/62 (BP Location: Right Arm)   Pulse (!) 127   Temp 97.8 F (36.6 C) (Oral)   Resp 16   Ht 6' (1.829 m)   Wt 99.8 kg (220 lb)   SpO2 100%   BMI 29.84 kg/m  Pain Scale: 0-10   Pain Score: 0-No pain   SpO2: SpO2: 100 % O2 Device:SpO2: 100 % O2 Flow Rate: .   IO: Intake/output summary:   Intake/Output Summary (Last 24 hours) at 08/03/2017 1612 Last data filed at 08/03/2017 1600 Gross per 24 hour  Intake 920 ml  Output 2400 ml  Net -1480 ml    LBM: Last BM Date: 08/02/17 Baseline Weight: Weight: 94.8 kg (209 lb) Most recent weight: Weight: 99.8 kg (220 lb)     Palliative Assessment/Data:   PPS 30%  Time In:  1500 Time Out:  1600 Time Total:  60 min  Greater than 50%  of this time was spent  counseling and coordinating care related to the above assessment and plan.  Signed by: Loistine Chance, MD  [phone]  Please contact Palliative Medicine Team phone at [phone] for questions and concerns.  For individual provider: See Shea Evans

## 2017-08-04 DIAGNOSIS — R740 Nonspecific elevation of levels of transaminase and lactic acid dehydrogenase [LDH]: Secondary | ICD-10-CM | POA: Diagnosis not present

## 2017-08-04 DIAGNOSIS — R55 Syncope and collapse: Secondary | ICD-10-CM | POA: Diagnosis not present

## 2017-08-04 DIAGNOSIS — I2510 Atherosclerotic heart disease of native coronary artery without angina pectoris: Secondary | ICD-10-CM | POA: Diagnosis not present

## 2017-08-04 DIAGNOSIS — R1312 Dysphagia, oropharyngeal phase: Secondary | ICD-10-CM | POA: Diagnosis not present

## 2017-08-04 DIAGNOSIS — R338 Other retention of urine: Secondary | ICD-10-CM | POA: Diagnosis not present

## 2017-08-04 DIAGNOSIS — I5030 Unspecified diastolic (congestive) heart failure: Secondary | ICD-10-CM | POA: Diagnosis not present

## 2017-08-04 DIAGNOSIS — E785 Hyperlipidemia, unspecified: Secondary | ICD-10-CM | POA: Diagnosis not present

## 2017-08-04 DIAGNOSIS — I82401 Acute embolism and thrombosis of unspecified deep veins of right lower extremity: Secondary | ICD-10-CM | POA: Diagnosis not present

## 2017-08-04 DIAGNOSIS — M6281 Muscle weakness (generalized): Secondary | ICD-10-CM | POA: Diagnosis not present

## 2017-08-04 DIAGNOSIS — I10 Essential (primary) hypertension: Secondary | ICD-10-CM | POA: Diagnosis not present

## 2017-08-04 DIAGNOSIS — M2550 Pain in unspecified joint: Secondary | ICD-10-CM | POA: Diagnosis not present

## 2017-08-04 DIAGNOSIS — B965 Pseudomonas (aeruginosa) (mallei) (pseudomallei) as the cause of diseases classified elsewhere: Secondary | ICD-10-CM | POA: Diagnosis not present

## 2017-08-04 DIAGNOSIS — I110 Hypertensive heart disease with heart failure: Secondary | ICD-10-CM | POA: Diagnosis not present

## 2017-08-04 DIAGNOSIS — N390 Urinary tract infection, site not specified: Secondary | ICD-10-CM | POA: Diagnosis not present

## 2017-08-04 DIAGNOSIS — R488 Other symbolic dysfunctions: Secondary | ICD-10-CM | POA: Diagnosis not present

## 2017-08-04 DIAGNOSIS — Z960 Presence of urogenital implants: Secondary | ICD-10-CM | POA: Diagnosis not present

## 2017-08-04 DIAGNOSIS — I5032 Chronic diastolic (congestive) heart failure: Secondary | ICD-10-CM | POA: Diagnosis not present

## 2017-08-04 DIAGNOSIS — R310 Gross hematuria: Secondary | ICD-10-CM | POA: Diagnosis not present

## 2017-08-04 DIAGNOSIS — T83511D Infection and inflammatory reaction due to indwelling urethral catheter, subsequent encounter: Secondary | ICD-10-CM | POA: Diagnosis not present

## 2017-08-04 DIAGNOSIS — R2689 Other abnormalities of gait and mobility: Secondary | ICD-10-CM | POA: Diagnosis not present

## 2017-08-04 DIAGNOSIS — Z7401 Bed confinement status: Secondary | ICD-10-CM | POA: Diagnosis not present

## 2017-08-04 DIAGNOSIS — L299 Pruritus, unspecified: Secondary | ICD-10-CM | POA: Diagnosis not present

## 2017-08-04 DIAGNOSIS — I2699 Other pulmonary embolism without acute cor pulmonale: Secondary | ICD-10-CM | POA: Diagnosis not present

## 2017-08-04 DIAGNOSIS — I482 Chronic atrial fibrillation: Secondary | ICD-10-CM | POA: Diagnosis not present

## 2017-08-04 DIAGNOSIS — Z8673 Personal history of transient ischemic attack (TIA), and cerebral infarction without residual deficits: Secondary | ICD-10-CM | POA: Diagnosis not present

## 2017-08-04 MED ORDER — HYDROCORTISONE 1 % EX CREA: 1.0000 "application " | TOPICAL_CREAM | Freq: Three times a day (TID) | CUTANEOUS | 0 refills | Status: AC | PRN

## 2017-08-04 MED ORDER — CEFEPIME IV (FOR PTA / DISCHARGE USE ONLY): 2.0000 g | Freq: Two times a day (BID) | INTRAVENOUS | 0 refills | Status: AC

## 2017-08-04 NOTE — Progress Notes (Signed)
Attempt to call [facility] to give report call was transferred with no response. Will try to call again at a later time.

## 2017-08-04 NOTE — Progress Notes (Signed)
[facility]- [phone]. BCBS Medicare authorized readmission to SNF for therapy and IV antibiotics.  DC information provided via the HUB. [facility] transport arranged Family at bedside agreeable/aware  Ilean SkillMeghan Estie Sproule, MSW, LCSW Clinical Social Work 08/04/2017 [phone]

## 2017-08-04 NOTE — Discharge Instructions (Signed)
Urinary Tract Infection, Adult  A urinary tract infection (UTI) is an infection of any part of the urinary tract, which includes the kidneys, ureters, bladder, and urethra. These organs make, store, and get rid of urine in the body. UTI can be a bladder infection (cystitis) or kidney infection (pyelonephritis).  What are the causes?  This infection may be caused by fungi, viruses, or bacteria. Bacteria are the most common cause of UTIs. This condition can also be caused by repeated incomplete emptying of the bladder during urination.  What increases the risk?  This condition is more likely to develop if:   You ignore your need to urinate or hold urine for long periods of time.   You do not empty your bladder completely during urination.   You wipe back to front after urinating or having a bowel movement, if you are male.   You are uncircumcised, if you are male.   You are constipated.   You have a urinary catheter that stays in place (indwelling).   You have a weak defense (immune) system.   You have a medical condition that affects your bowels, kidneys, or bladder.   You have diabetes.   You take antibiotic medicines frequently or for long periods of time, and the antibiotics no longer work well against certain types of infections (antibiotic resistance).   You take medicines that irritate your urinary tract.   You are exposed to chemicals that irritate your urinary tract.   You are male.    What are the signs or symptoms?  Symptoms of this condition include:   Fever.   Frequent urination or passing small amounts of urine frequently.   Needing to urinate urgently.   Pain or burning with urination.   Urine that smells bad or unusual.   Cloudy urine.   Pain in the lower abdomen or back.   Trouble urinating.   Blood in the urine.   Vomiting or being less hungry than normal.   Diarrhea or abdominal pain.   Vaginal discharge, if you are male.    How is this diagnosed?  This condition is  diagnosed with a medical history and physical exam. You will also need to provide a urine sample to test your urine. Other tests may be done, including:   Blood tests.   Sexually transmitted disease (STD) testing.    If you have had more than one UTI, a cystoscopy or imaging studies may be done to determine the cause of the infections.  How is this treated?  Treatment for this condition often includes a combination of two or more of the following:   Antibiotic medicine.   Other medicines to treat less common causes of UTI.   Over-the-counter medicines to treat pain.   Drinking enough water to stay hydrated.    Follow these instructions at home:   Take over-the-counter and prescription medicines only as told by your health care provider.   If you were prescribed an antibiotic, take it as told by your health care provider. Do not stop taking the antibiotic even if you start to feel better.   Avoid alcohol, caffeine, tea, and carbonated beverages. They can irritate your bladder.   Drink enough fluid to keep your urine clear or pale yellow.   Keep all follow-up visits as told by your health care provider. This is important.   Make sure to:  ? Empty your bladder often and completely. Do not hold urine for long periods of time.  ?   Empty your bladder before and after sex.  ? Wipe from front to back after a bowel movement if you are male. Use each tissue one time when you wipe.  Contact a health care provider if:   You have back pain.   You have a fever.   You feel nauseous or vomit.   Your symptoms do not get better after 3 days.   Your symptoms go away and then return.  Get help right away if:   You have severe back pain or lower abdominal pain.   You are vomiting and cannot keep down any medicines or water.  This information is not intended to replace advice given to you by your health care provider. Make sure you discuss any questions you have with your health care provider.  Document Released:  11/04/2004 Document Revised: 07/09/2015 Document Reviewed: 12/16/2014  Elsevier Interactive Patient Education  2018 Elsevier Inc.

## 2017-08-04 NOTE — Progress Notes (Addendum)
Notified Dr. Catha GosselinMikhail to request voiding trial. Per Dr. Catha GosselinMikhail patient has a chronic foley for chronic urinary retention and that the foley is changed on an outpatient basis  in the urology office. There is no need for voiding trial.  Patient is going to be d/c to facility with foley.

## 2017-08-05 LAB — URINE CULTURE: Culture: 40000 — AB

## 2017-08-07 LAB — CULTURE, BLOOD (ROUTINE X 2)
Culture: NO GROWTH
Culture: NO GROWTH
SPECIAL REQUESTS: ADEQUATE

## 2017-08-10 ENCOUNTER — Encounter (INDEPENDENT_AMBULATORY_CARE_PROVIDER_SITE_OTHER): Payer: Self-pay

## 2017-08-10 ENCOUNTER — Ambulatory Visit: Payer: Medicare Other | Admitting: Cardiovascular Disease

## 2017-08-10 ENCOUNTER — Non-Acute Institutional Stay: Payer: Self-pay | Admitting: Hospice and Palliative Medicine

## 2017-08-10 ENCOUNTER — Encounter: Payer: Self-pay | Admitting: Cardiovascular Disease

## 2017-08-10 VITALS — BP 126/62 | HR 89 | Ht 72.0 in | Wt 220.0 lb

## 2017-08-10 DIAGNOSIS — I482 Chronic atrial fibrillation, unspecified: Secondary | ICD-10-CM

## 2017-08-10 DIAGNOSIS — I5032 Chronic diastolic (congestive) heart failure: Secondary | ICD-10-CM

## 2017-08-10 DIAGNOSIS — I10 Essential (primary) hypertension: Secondary | ICD-10-CM

## 2017-08-10 DIAGNOSIS — Z515 Encounter for palliative care: Secondary | ICD-10-CM

## 2017-08-10 NOTE — Progress Notes (Signed)
Cardiology Office Note   Date:  08/10/2017   ID:  [name], DOB 1925-03-21, MRN [id number]  PCP:  Ivan Boyden, MD  Cardiologist: Ivan Clement MD - , now [name]   Chief Complaint  Patient presents with  . Atrial Fibrillation  . Congestive Heart Failure   Problem List 1. Chronic atrial fibrillation. 2. remote embolic stroke with expressive aphasia 3. history of aortic stenosis 4. hypertensive heart disease without heart failure 5. Hypercholesterolemia  6. chronic diastolic heart failure    Previous notes from Dr. Harlow Asa [name] is a 84 y.o. male  This pleasant 84 year old gentleman is seen for a four-month followup office visit. He has a history of established chronic atrial fibrillation. 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 He also has a history of chronic diastolic congestive heart failure. He has not had a cardiac catheterization. He does not have any history of ischemic heart disease. However an adenosine Cardiolite stress test in December 2009 showed evidence of an old scar of the septum and a larger apical scar but no reversible ischemia. The patient had an echocardiogram in November 2009 showing an ejection fraction 55-60% with mild aortic stenosis and mild pulmonary hypertension and mild mitral regurgitation. The patient is on long-term Coumadin for his chronic atrial fibrillation.  The patient continues to have exertional dyspnea which is stable. He has not been having any peripheral edema. He is having more of a problem with bladder urgency. He is no longer able to attend church.  Nov. 13, 2017:    Initial visit with [name]  - transfer from [place].  Seen with his wife, [name].  Has a hx of chronic A-Fib , mild pulmonary HTN No CP or dyspnea. Has gout issues Has some swelling in his legs - R>L    Jun 28, 2016:  Doing well.   Having some issues with incontenence - is on  Lasix. Had   incontinence today on his way into the office I suggested that he hold it when he has an appt. .  BP looks good Breathing is good   Nov. 20, 2018:  Seen for chronic Afib ,  Mild pulmonary HTN  Has lost about 40 lbs this year   August 10, 2017:  [name] is seen today for follow-up of his atrial fibrillation.  He is examined in the wheelchair.  He has mild pulmonary hypertension. He fell several weeks ago and had a mini - stroke .   Had hematura with blood clots in his foley bag and stopped his coumadin Also found to have 3 different  abx for a complex UTI  Was also found to have DVT with pulmonary emboli.   Since then has been at [facility]. Marland Kitchen Has not been walking in weeks .  Past Medical History:  Diagnosis Date  . Atrial fibrillation (HCC)    chronic  . BPH (benign prostatic hyperplasia)   . CAD (coronary artery disease)    s/p MI, unsure when  . Chronic diastolic heart failure (HCC)   . Chronic indwelling Foley catheter   . CKD (chronic kidney disease), stage III (HCC)   . Cognitive impairment    from stroke  . 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 DOE (dyspnea on exertion)   . Gout   . HTN (hypertension)   . Hyperlipidemia     Past Surgical History:  Procedure Laterality  Date  . APPENDECTOMY  1994  . CARDIOVASCULAR STRESS TEST  01/11/2008   evidence of an old scar of the septum and a larger apical scar but no reversible ischemia, EF 48%  . CATARACT EXTRACTION Left 2008   [place]  . EXCISION MORTON'S NEUROMA  1995   x 2  . HEMORRHOID SURGERY    . TONSILLECTOMY  1964  . US ECHOCARDIOGRAPHY  12/19/2007   EF 55-60%, mild aortic stenosis and mild pulmonary hypertension and mild mitral regurgitation     Current Outpatient Medications  Medication Sig Dispense Refill  . allopurinol (ZYLOPRIM) 100 MG tablet Take 1 tablet (100 mg total) by mouth daily. 90 tablet 3  . apixaban (ELIQUIS) 5 MG TABS  tablet Take 1 tablet (5 mg total) by mouth 2 (two) times daily. Starting ON 6/27 AT 10 AM 60 tablet   . atorvastatin (LIPITOR) 80 MG tablet Take 1 tablet (80 mg total) by mouth daily at 6 PM.    . digoxin (LANOXIN) 0.125 MG tablet Take 1 tablet (125 mcg total) by mouth every other day. 45 tablet 3  . finasteride (PROSCAR) 5 MG tablet Take 5 mg by mouth daily.    . hydrocortisone cream 1 % Apply 1 application topically 3 (three) times daily as needed for itching (minor skin irritation). 30 g 0  . metoprolol succinate (TOPROL-XL) 25 MG 24 hr tablet TAKE ONE-HALF TABLET BY MOUTH ONCE DAILY 45 tablet 3  . nitroGLYCERIN (NITROSTAT) 0.4 MG SL tablet Place 0.4 mg under the tongue every 5 (five) minutes as needed (for chest pain).     . Omega-3 Fatty Acids (FISH OIL) 1000 MG CAPS Take 1,000 mg by mouth daily.      No current facility-administered medications for this visit.     Allergies:   Benazepril hcl    Social History:  The patient  reports that he quit smoking about 67 years ago. He has never used smokeless tobacco. He reports that he does not drink alcohol or use drugs.   Family History:  The patient's family history includes Alcohol abuse in his brother; CAD in his sister; Cancer in his brother and sister.    ROS:  Please see the history of present illness.   Otherwise, review of systems are positive for none.   All other systems are reviewed and negative.   Physical Exam: Blood pressure 126/62, pulse 89, height 6' (1.829 m), weight 220 lb (99.8 kg), SpO2 93 %.  GEN:   Elderly male,  Examined in wheelchair.   HEENT: Normal NECK: No JVD; No carotid bruits LYMPHATICS: No lymphadenopathy CARDIAC: irreg. Irreg.  RESPIRATORY:  Clear to auscultation without rales, wheezing or rhonchi  ABDOMEN: Soft, non-tender, non-distended MUSCULOSKELETAL:  1+ edema  SKIN: Warm and dry NEUROLOGIC:  Very weak,     EKG:       Recent Labs: 07/25/2017: B Natriuretic Peptide 165.4; TSH  3.617 08/03/2017: ALT 85; BUN 27; Creatinine, Ser 1.14; Hemoglobin 12.4; Magnesium 2.3; Platelets 311; Potassium 4.4; Sodium 141    Lipid Panel    Component Value Date/Time   CHOL 159 07/26/2017 0640   TRIG 65 07/26/2017 0640   HDL 43 07/26/2017 0640   CHOLHDL 3.7 07/26/2017 0640   VLDL 13 07/26/2017 0640   LDLCALC 103 (H) 07/26/2017 0640      Wt Readings from Last 3 Encounters:  08/10/17 220 lb (99.8 kg)  08/01/17 220 lb (99.8 kg)  07/25/17 209 lb 7 oz (95 kg)  ASSESSMENT AND PLAN:  1. Chronic atrial fibrillation:    Currently on Eliquis.  He was on Coumadin but this caused him to have hematuria resulting in blood clots in his Foley bag.  The Coumadin was stopped but then he developed DVT with pulmonary emboli. It is difficult for him to afford the Eliquis.  They may be looking at some alternatives.  2. remote embolic stroke with expressive aphasia.     3. history of aortic stenosis.   4. hypertensive heart disease -blood pressure is fairly well controlled.  5. Hypercholesterolemia:     6. chronic diastolic heart failure -he is basically wheelchair-bound for the most part.  He does not complain of any shortness of breath.    Current medicines are reviewed at length with the patient today.  The patient does not have concerns regarding medicines.  The following changes have been made:  no change  Labs/ tests ordered today include:   No orders of the defined types were placed in this encounter.    Ivan MissPhilip Trenesha Alcaide, MD  08/10/2017 4:01 PM    [facility] [address],  [address] [place], [place]  [address] Pager [phone] Phone: [phone]; Fax: [phone]

## 2017-08-10 NOTE — Addendum Note (Signed)
Addended by: Laurette SchimkeBORDERS, Recardo Linn R on: 08/10/2017 01:03 PM   Modules accepted: Level of Service

## 2017-08-10 NOTE — Patient Instructions (Signed)

## 2017-08-10 NOTE — Progress Notes (Addendum)
PALLIATIVE CARE CONSULT VISIT   PATIENT NAME: [name] DOB: February 28, 1925 MRN: [id number]  PRIMARY CARE PROVIDER:   Ria Bush, MD  REFERRING PROVIDER:  Dr. Aubery Lapping   RESPONSIBLE PARTY:   Wife, [name] [phone]  ASSESSMENT:     Patient is alert but confused. Unable to meaningfully participate in conversations about goals. No family at bedside.   Case discussed with nursing staff. Reportedly, patient has been slow to progress with rehab. He has had poor oral intake, eating mostly bites and sips. He has also been talking about being ready to die.   I met with patient's wife. She is very concerned about patient's lack of oral intake. He has been frequently expressing to her that he does not want to eat and just wants to be left alone to die. She asks about IV fluids but agreed that patient would not want that. We talked about anorexia in the setting of advanced illness and end of life. She primarily wants to take him home and I explained that patient would qualify for hospice services. She seemed interested in hospice but wanted to talk with a niece about it first.   Case discussed with primary facility NP.  I would recommend hospice involvement when eligible (I.e. Post rehab).   MOST form in chart details desire for DNAR and comfort measures only.   RECOMMENDATIONS and PLAN:  1. DNR 2. Continue rehab 3. Will speak with wife.   I spent 25 minutes providing this consultation,  from 1130 to 1155. More than 50% of the time in this consultation was spent coordinating communication.   HISTORY OF PRESENT ILLNESS:  [name] is a 84 y.o. year old male with multiple medical problems including vascular dementia, h/o recent CVA, afib on Eliquis, recent DVT/PE, urinary retention with indwelling Foley, who has had multiple recent hospitalizations, most recently 08/01/17 to 08/04/17 for CAUTI. Patient was seen in consultation by PMT while in the hospital. Palliative  Care was asked to follow at rehab.   CODE STATUS: DNR  PPS: 30% HOSPICE ELIGIBILITY/DIAGNOSIS: Yes, - once off rehab  PAST MEDICAL HISTORY:  Past Medical History:  Diagnosis Date  . Atrial fibrillation (HCC)    chronic  . BPH (benign prostatic hyperplasia)   . CAD (coronary artery disease)    s/p MI, unsure when  . Chronic diastolic heart failure ([place])   . Chronic indwelling Foley catheter   . CKD (chronic kidney disease), stage III ([place])   . Cognitive impairment    from stroke  . CVA (cerebrovascular accident) ([place]) x4 (latest 8295)   embolic, residual cognitive impairment  . DOE (dyspnea on exertion)   . Gout   . HTN (hypertension)   . Hyperlipidemia     SOCIAL HX:  Social History   Tobacco Use  . Smoking status: Former Smoker    Last attempt to quit: 02/08/1950    Years since quitting: 67.5  . Smokeless tobacco: Never Used  Substance Use Topics  . Alcohol use: No    ALLERGIES:  Allergies  Allergen Reactions  . Benazepril Hcl Other (See Comments)    Lethargic, nervous, nauseated     PERTINENT MEDICATIONS:  Outpatient Encounter Medications as of 08/10/2017  Medication Sig  . allopurinol (ZYLOPRIM) 100 MG tablet Take 1 tablet (100 mg total) by mouth daily.  Marland Kitchen apixaban (ELIQUIS) 5 MG TABS tablet Take 1 tablet (5 mg total) by mouth 2 (two) times daily. Starting ON 6/27 AT 10 AM (Patient  not taking: Reported on 08/01/2017)  . atorvastatin (LIPITOR) 80 MG tablet Take 1 tablet (80 mg total) by mouth daily at 6 PM.  . digoxin (LANOXIN) 0.125 MG tablet Take 1 tablet (125 mcg total) by mouth every other day.  . finasteride (PROSCAR) 5 MG tablet Take 5 mg by mouth daily.  . hydrocortisone cream 1 % Apply 1 application topically 3 (three) times daily as needed for itching (minor skin irritation).  . metoprolol succinate (TOPROL-XL) 25 MG 24 hr tablet TAKE ONE-HALF TABLET BY MOUTH ONCE DAILY (Patient taking differently: Take 12.5 mg by mouth daily. TAKE ONE-HALF TABLET BY MOUTH  ONCE DAILY)  . nitroGLYCERIN (NITROSTAT) 0.4 MG SL tablet Place 0.4 mg under the tongue every 5 (five) minutes as needed (for chest pain).   . Omega-3 Fatty Acids (FISH OIL) 1000 MG CAPS Take 1,000 mg by mouth daily.    No facility-administered encounter medications on file as of 08/10/2017.     PHYSICAL EXAM:   General: NAD, frail appearing, thin, lying in bed Cardiovascular: irregular rate and rhythm Pulmonary: poor air movement without wheeze, on O2 Abdomen: soft, nontender, + bowel sounds GU: no suprapubic tenderness Extremities: no edema, no joint deformities Skin: no rashes Neurological: Weakness, wakes when stimulated, speech clear, follows simple commands, oriented to person only  Irean Hong, NP

## 2017-08-12 DIAGNOSIS — I69319 Unspecified symptoms and signs involving cognitive functions following cerebral infarction: Secondary | ICD-10-CM

## 2017-08-12 DIAGNOSIS — Z87891 Personal history of nicotine dependence: Secondary | ICD-10-CM

## 2017-08-12 DIAGNOSIS — T83511D Infection and inflammatory reaction due to indwelling urethral catheter, subsequent encounter: Secondary | ICD-10-CM | POA: Diagnosis not present

## 2017-08-12 DIAGNOSIS — I2510 Atherosclerotic heart disease of native coronary artery without angina pectoris: Secondary | ICD-10-CM | POA: Diagnosis not present

## 2017-08-12 DIAGNOSIS — N400 Enlarged prostate without lower urinary tract symptoms: Secondary | ICD-10-CM

## 2017-08-12 DIAGNOSIS — Z7901 Long term (current) use of anticoagulants: Secondary | ICD-10-CM

## 2017-08-12 DIAGNOSIS — M109 Gout, unspecified: Secondary | ICD-10-CM

## 2017-08-12 DIAGNOSIS — Z66 Do not resuscitate: Secondary | ICD-10-CM

## 2017-08-12 DIAGNOSIS — I110 Hypertensive heart disease with heart failure: Secondary | ICD-10-CM | POA: Diagnosis not present

## 2017-08-12 DIAGNOSIS — Z466 Encounter for fitting and adjustment of urinary device: Secondary | ICD-10-CM

## 2017-08-12 DIAGNOSIS — Z5181 Encounter for therapeutic drug level monitoring: Secondary | ICD-10-CM

## 2017-08-12 DIAGNOSIS — E785 Hyperlipidemia, unspecified: Secondary | ICD-10-CM

## 2017-08-12 DIAGNOSIS — Z7984 Long term (current) use of oral hypoglycemic drugs: Secondary | ICD-10-CM

## 2017-08-12 DIAGNOSIS — I482 Chronic atrial fibrillation: Secondary | ICD-10-CM

## 2017-08-12 DIAGNOSIS — I5030 Unspecified diastolic (congestive) heart failure: Secondary | ICD-10-CM | POA: Diagnosis not present

## 2017-08-15 ENCOUNTER — Non-Acute Institutional Stay: Payer: Self-pay | Admitting: Hospice and Palliative Medicine

## 2017-08-15 DIAGNOSIS — Z515 Encounter for palliative care: Secondary | ICD-10-CM

## 2017-08-16 NOTE — Progress Notes (Signed)
PALLIATIVE CARE CONSULT VISIT   PATIENT NAME: [name] DOB: 1925-10-13 MRN: [id number]  PRIMARY CARE PROVIDER:   Ria Bush, MD  REFERRING PROVIDER:  Dr. Aubery Lapping   RESPONSIBLE PARTY:   Wife, [name] [phone]  ASSESSMENT:     Clinically about the same. Patient is in NAD. He seems comfortable. Still has extremely poor oral intake and speaks frequently of wanting to just die. His primary goal is to go home.   I met with patient's wife and niece. Niece is concerned that with wife's ability to care for patient at home. We explored the idea of hospice involvement but this would not provide 24/7 care in the home. It does not sound like there are finances available to supplement home care. Family is meeting tomorrow with facility staff to discuss options. Niece seems hopeful that patient could stay at facility.   RECOMMENDATIONS and PLAN:  1. Will follow  I spent 30 minutes providing this consultation,  from 1200 to 1230. More than 50% of the time in this consultation was spent coordinating communication.   HISTORY OF PRESENT ILLNESS:  [name] is a 84 y.o. year old male with multiple medical problems including vascular dementia, h/o recent CVA, afib on Eliquis, recent DVT/PE, urinary retention with indwelling Foley, who has had multiple recent hospitalizations, most recently 08/01/17 to 08/04/17 for CAUTI. Patient was seen in consultation by PMT while in the hospital. Palliative Care was asked to follow at rehab.   CODE STATUS: DNR  PPS: 30% HOSPICE ELIGIBILITY/DIAGNOSIS: Yes, - once off rehab  PAST MEDICAL HISTORY:  Past Medical History:  Diagnosis Date  . Atrial fibrillation (HCC)    chronic  . BPH (benign prostatic hyperplasia)   . CAD (coronary artery disease)    s/p MI, unsure when  . Chronic diastolic heart failure ([place])   . Chronic indwelling Foley catheter   . CKD (chronic kidney disease), stage III ([place])   . Cognitive impairment    from  stroke  . CVA (cerebrovascular accident) ([place]) x4 (latest 2952)   embolic, residual cognitive impairment  . DOE (dyspnea on exertion)   . Gout   . HTN (hypertension)   . Hyperlipidemia     SOCIAL HX:  Social History   Tobacco Use  . Smoking status: Former Smoker    Last attempt to quit: 02/08/1950    Years since quitting: 67.5  . Smokeless tobacco: Never Used  Substance Use Topics  . Alcohol use: No    ALLERGIES:  Allergies  Allergen Reactions  . Benazepril Hcl Other (See Comments)    Lethargic, nervous, nauseated     PERTINENT MEDICATIONS:  Outpatient Encounter Medications as of 08/15/2017  Medication Sig  . allopurinol (ZYLOPRIM) 100 MG tablet Take 1 tablet (100 mg total) by mouth daily.  Marland Kitchen apixaban (ELIQUIS) 5 MG TABS tablet Take 1 tablet (5 mg total) by mouth 2 (two) times daily. Starting ON 6/27 AT 10 AM  . atorvastatin (LIPITOR) 80 MG tablet Take 1 tablet (80 mg total) by mouth daily at 6 PM.  . digoxin (LANOXIN) 0.125 MG tablet Take 1 tablet (125 mcg total) by mouth every other day.  . finasteride (PROSCAR) 5 MG tablet Take 5 mg by mouth daily.  . hydrocortisone cream 1 % Apply 1 application topically 3 (three) times daily as needed for itching (minor skin irritation).  . metoprolol succinate (TOPROL-XL) 25 MG 24 hr tablet TAKE ONE-HALF TABLET BY MOUTH ONCE DAILY  . nitroGLYCERIN (NITROSTAT)  0.4 MG SL tablet Place 0.4 mg under the tongue every 5 (five) minutes as needed (for chest pain).   . Omega-3 Fatty Acids (FISH OIL) 1000 MG CAPS Take 1,000 mg by mouth daily.    No facility-administered encounter medications on file as of 08/15/2017.     PHYSICAL EXAM:   General: NAD, frail appearing, thin, lying in bed Cardiovascular: irregular rate and rhythm Pulmonary: poor air movement without wheeze, on O2 Extremities: no edema, no joint deformities Skin: no rashes Neurological: Weakness, awake, clear speech, follows commands  Irean Hong, NP

## 2017-08-17 ENCOUNTER — Telehealth: Payer: Self-pay | Admitting: Family Medicine

## 2017-08-17 DIAGNOSIS — Z8673 Personal history of transient ischemic attack (TIA), and cerebral infarction without residual deficits: Secondary | ICD-10-CM

## 2017-08-17 DIAGNOSIS — R4189 Other symptoms and signs involving cognitive functions and awareness: Secondary | ICD-10-CM

## 2017-08-17 DIAGNOSIS — I639 Cerebral infarction, unspecified: Secondary | ICD-10-CM

## 2017-08-17 DIAGNOSIS — I2699 Other pulmonary embolism without acute cor pulmonale: Secondary | ICD-10-CM

## 2017-08-17 NOTE — Telephone Encounter (Signed)
plz call wife for update on patient condition - is pt at [facility] or back at home now? Referral placed.

## 2017-08-17 NOTE — Telephone Encounter (Signed)
Copied from CRM [id number]. Topic: General - Other >> Aug 17, 2017 10:49 AM Stephannie LiSimmons, Maclaine Ahola L, NT wrote: Reason for CRM: [place] called and sai the patients family wants the patient to be seen by hospice at home

## 2017-08-18 DIAGNOSIS — R310 Gross hematuria: Secondary | ICD-10-CM | POA: Diagnosis not present

## 2017-08-18 DIAGNOSIS — R338 Other retention of urine: Secondary | ICD-10-CM | POA: Diagnosis not present

## 2017-08-18 NOTE — Addendum Note (Signed)
Addended by: Eustaquio BoydenGUTIERREZ, Jozlynn Plaia on: 08/18/2017 04:53 PM   Modules accepted: Orders

## 2017-08-18 NOTE — Telephone Encounter (Signed)
Spoke with pt's niece, Albin FellingCarla (on dpr). States [facility] but will be going home tomorrow afternoon.  Albin FellingCarla can best be reached at [phone] if needed. Gives permission to lvm.

## 2017-08-18 NOTE — Telephone Encounter (Addendum)
Attempted to contact pt's wife, [name] with only phn # on file. No answer. No vm.  Heard a "fax ringing", then call disconnected.  Sent CRM in case pt's wife calls back to asking if anyone tried to call from our office.  Need to relay Dr. Timoteo ExposeG's message.

## 2017-08-18 NOTE — Telephone Encounter (Signed)
The family was requesting a Hospice Referral per request in phone note,  but the one in Epic is for Palliative Care. I put the Hospice Referral form in your in box. Please put Hospice Referral in Epic.

## 2017-08-18 NOTE — Telephone Encounter (Signed)
New hospice referral placed.

## 2017-08-19 NOTE — Telephone Encounter (Signed)
Hospice Referral faxed to [facility]. °

## 2017-08-25 ENCOUNTER — Non-Acute Institutional Stay: Payer: Self-pay | Admitting: Hospice and Palliative Medicine

## 2017-08-25 DIAGNOSIS — Z515 Encounter for palliative care: Secondary | ICD-10-CM

## 2017-08-25 NOTE — Progress Notes (Signed)
Follow up visit attempted. Patient discharged from facility to home.

## 2017-08-26 ENCOUNTER — Inpatient Hospital Stay: Payer: Medicare Other | Admitting: Family Medicine

## 2017-08-29 ENCOUNTER — Ambulatory Visit: Payer: Self-pay | Admitting: Adult Health

## 2017-09-04 DIAGNOSIS — I82409 Acute embolism and thrombosis of unspecified deep veins of unspecified lower extremity: Secondary | ICD-10-CM

## 2017-09-04 DIAGNOSIS — I25119 Atherosclerotic heart disease of native coronary artery with unspecified angina pectoris: Secondary | ICD-10-CM

## 2017-09-04 DIAGNOSIS — N183 Chronic kidney disease, stage 3 (moderate): Secondary | ICD-10-CM

## 2017-09-04 DIAGNOSIS — N401 Enlarged prostate with lower urinary tract symptoms: Secondary | ICD-10-CM

## 2017-09-04 DIAGNOSIS — I5030 Unspecified diastolic (congestive) heart failure: Secondary | ICD-10-CM

## 2017-09-04 DIAGNOSIS — I679 Cerebrovascular disease, unspecified: Secondary | ICD-10-CM

## 2017-09-04 DIAGNOSIS — I672 Cerebral atherosclerosis: Secondary | ICD-10-CM | POA: Diagnosis not present

## 2017-09-16 ENCOUNTER — Telehealth: Payer: Self-pay | Admitting: Family Medicine

## 2017-09-16 NOTE — Telephone Encounter (Signed)
Copied from CRM [id number]. Topic: Quick Communication - Rx Refill/Question >> Sep 16, 2017  4:04 PM Mickel BaasMcGee, Avabella Wailes B, NT wrote: Medication: furosemide (LASIX) tablet 40 mg metoprolol succinate (TOPROL-XL) 25 MG 24 hr tablet digoxin (LANOXIN) 0.125 MG tablet    Has the patient contacted their pharmacy? Yes.   (Agent: If no, request that the patient contact the pharmacy for the refill.) (Agent: If yes, when and what did the pharmacy advise?)  Preferred Pharmacy (with phone number or street name): [id number] - [place], [place] - [address]  Agent: Please be advised that RX refills may take up to 3 business days. We ask that you follow-up with your pharmacy.

## 2017-09-16 NOTE — Telephone Encounter (Signed)
Copied from CRM [id number]. Topic: Quick Communication - Rx Refill/Question >> Sep 16, 2017  4:02 PM Mickel BaasMcGee, Thuy Atilano B, NT wrote: Medication: apixaban (ELIQUIS) 5 MG TABS tablet  -- requesting samples, first, if possible   Has the patient contacted their pharmacy? Yes.   (Agent: If no, request that the patient contact the pharmacy for the refill.) (Agent: If yes, when and what did the pharmacy advise?)  Preferred Pharmacy (with phone number or street name): [id number] - [place], [place] - [phone]  Agent: Please be advised that RX refills may take up to 3 business days. We ask that you follow-up with your pharmacy.

## 2017-09-16 NOTE — Telephone Encounter (Signed)
Last OV 02/21/17 with Dr. Sharen HonesGutierrez. Medications requested below previously filled by a different provider.   [facility]

## 2017-09-19 NOTE — Telephone Encounter (Signed)
Eliquis Last rx:  08/04/17, #60 Last OV:  02/21/17, f/u Next OV (CPE):  10/12/17

## 2017-09-20 MED ORDER — FUROSEMIDE 40 MG PO TABS: 40.0000 mg | ORAL_TABLET | Freq: Every day | ORAL | 6 refills | Status: AC

## 2017-09-20 MED ORDER — METOPROLOL SUCCINATE ER 25 MG PO TB24: ORAL_TABLET | ORAL | 0 refills | Status: DC

## 2017-09-20 MED ORDER — APIXABAN 5 MG PO TABS: 5.0000 mg | ORAL_TABLET | Freq: Two times a day (BID) | ORAL | 6 refills | Status: DC

## 2017-09-20 MED ORDER — DIGOXIN 125 MCG PO TABS: 125.0000 ug | ORAL_TABLET | ORAL | 0 refills | Status: DC

## 2017-09-20 NOTE — Telephone Encounter (Signed)
Refilled  Lab Results  Component Value Date   CREATININE 1.14 08/03/2017

## 2017-09-20 NOTE — Telephone Encounter (Signed)
Sent refill for metoprolol and digoxin.  However, it looks like Lasix was discontinued on 08/04/17. Next OV is CPE on 10/12/17.   Do you want pt to resume Lasix 40 mg daily?

## 2017-09-20 NOTE — Telephone Encounter (Signed)
Refilled we don't have samples

## 2017-09-23 ENCOUNTER — Telehealth: Payer: Self-pay | Admitting: Family Medicine

## 2017-09-23 MED ORDER — MORPHINE SULFATE (CONCENTRATE) 20 MG/ML PO SOLN: 5.0000 mg | ORAL | 0 refills | Status: AC | PRN

## 2017-09-23 NOTE — Telephone Encounter (Signed)
Spoke with Ivan LushAndrea relaying message per Dr. Reece AgarG. She verbalizes understanding.  Ivan Lushndrea confirms the request for 5 mL q4h was incorrect and expresses her thanks for catching that.

## 2017-09-23 NOTE — Telephone Encounter (Signed)
plz notify this was sent in for patient 5mg  every 4 hours PRN dyspnea, pain. plz touch base with Sue LushAndrea - I received request for 5mL every 4 hours - I assume that was a mistake.  Update us next week with dyspnea and pt status

## 2017-09-23 NOTE — Telephone Encounter (Signed)
PRN visit- dyspnea visit- 20 minutes of breathing episode were patient felt he could not get air in. Position changes, cool fan, and patient did improve over period of 30 minutes. Family is reporting more frequent episodes of dyspnea with patient- nurse is requesting Roxanol 20 mg/ml Rx to help relieve symptoms. She states they normally start at 5 ml dose every 4 hours as needed - but that is up to physician. [facility] Please call her at [phone] Sue Lushndrea

## 2017-09-30 ENCOUNTER — Ambulatory Visit: Payer: Medicare Other

## 2017-10-05 ENCOUNTER — Ambulatory Visit: Payer: Medicare Other

## 2017-10-05 ENCOUNTER — Other Ambulatory Visit: Payer: Self-pay | Admitting: Family Medicine

## 2017-10-05 DIAGNOSIS — E785 Hyperlipidemia, unspecified: Secondary | ICD-10-CM

## 2017-10-05 DIAGNOSIS — R7401 Elevation of levels of liver transaminase levels: Secondary | ICD-10-CM

## 2017-10-05 DIAGNOSIS — E210 Primary hyperparathyroidism: Secondary | ICD-10-CM

## 2017-10-05 DIAGNOSIS — R740 Nonspecific elevation of levels of transaminase and lactic acid dehydrogenase [LDH]: Principal | ICD-10-CM

## 2017-10-05 DIAGNOSIS — I4821 Permanent atrial fibrillation: Secondary | ICD-10-CM

## 2017-10-05 DIAGNOSIS — M1A9XX0 Chronic gout, unspecified, without tophus (tophi): Secondary | ICD-10-CM

## 2017-10-05 DIAGNOSIS — N289 Disorder of kidney and ureter, unspecified: Secondary | ICD-10-CM

## 2017-10-12 ENCOUNTER — Encounter: Payer: Medicare Other | Admitting: Family Medicine

## 2017-10-19 ENCOUNTER — Telehealth: Payer: Self-pay | Admitting: Family Medicine

## 2017-10-19 NOTE — Telephone Encounter (Signed)
Copied from CRM [id number]. Topic: General - Other >> Oct 19, 2017 11:00 AM Stephannie Li, NT wrote: Reason for CRM: Chrislyn from [facility] of [place] called and needs a verbal re certify the patient to continue services  please [phone]

## 2017-10-19 NOTE — Telephone Encounter (Signed)
Ok to give verbal ok for this. Thanks.

## 2017-10-20 NOTE — Telephone Encounter (Signed)
Spoke with Chrislyn of hospice informing her Dr. Reece AgarG gives verbal orders to continue services for pt. Verbalizes understanding and expresses her thanks.

## 2017-10-26 ENCOUNTER — Telehealth: Payer: Self-pay | Admitting: Family Medicine

## 2017-10-26 MED ORDER — APIXABAN 2.5 MG PO TABS: 2.5000 mg | ORAL_TABLET | Freq: Two times a day (BID) | ORAL | 3 refills | Status: AC

## 2017-10-26 NOTE — Telephone Encounter (Signed)
eliquis 2.5mg  dose sent in to [facility].

## 2017-10-26 NOTE — Telephone Encounter (Signed)
Any UTI symptoms? How did urine do the rest of the day? Do we have any recent labs? If not would suggest CBC, BMP to check kidney function. Difficult situation. Anticoagulation previously caused blood clots in urine but stopping coumadin led to DVT.  We could try eliquis 2.5mg  BID dose instead of full dose?

## 2017-10-26 NOTE — Telephone Encounter (Signed)
Spoke with Chrislyn concerning pt. States pt just finished cipro 2 days ago for UTI.  Says she just went to the home again and pt had clots in his foley catheter she had to clean out and irrigate a couple of times and change size to finally get get catheter in and urine flowing.  Says the head nurse thinks the will have to constantly irrigate do to the continuous clots.   I relayed Dr. Timoteo ExposeG's message and instructions. Chrislyn verbalizes understanding and they will start new dose of Eliquis with this evenings dose.  Also, will have labs drawn. Says pt is not showing signs of UTI.

## 2017-10-26 NOTE — Telephone Encounter (Signed)
Copied from CRM [phone]. Topic: Quick Communication - See Telephone Encounter >> Oct 26, 2017 11:04 AM Luanna Coleawoud, Jessica L wrote: CRM for notification. See Telephone encounter for: 10/26/17. Chrislyn calling from [facility] [place] stated that pt had 1 liter of dark bloody urine in foley bag. Chrislyn would like to know if any changes to medication needed to be made. Please advise Cb#[phone]

## 2017-10-27 LAB — CBC AND DIFFERENTIAL
Hemoglobin: 10.7 — AB (ref 13.5–17.5)
Platelets: 319 (ref 150–399)
WBC: 16

## 2017-10-27 LAB — BASIC METABOLIC PANEL
CREATININE: 1.1 (ref 0.6–1.3)
GLUCOSE: 90
POTASSIUM: 4 (ref 3.4–5.3)
SODIUM: 136 — AB (ref 137–147)

## 2017-11-07 ENCOUNTER — Telehealth: Payer: Self-pay | Admitting: *Deleted

## 2017-11-07 NOTE — Telephone Encounter (Signed)
Copied from CRM [id number]. Topic: Quick Communication - See Telephone Encounter >> Oct 26, 2017 11:04 AM Luanna Cole wrote: CRM for notification. See Telephone encounter for: 10/26/17. Chrislyn calling from [facility] and [facility] of [place] stated that pt had 1 liter of dark bloody urine in foley bag. Chrislyn would like to know if any changes to medication needed to be made. Please advise Cb#[phone] >> Nov 07, 2017 12:17 PM Floria Raveling A wrote: Chrislyn called in and family would like to know the results of the blood work drawn on the 19th?    Best number [phone]

## 2017-11-07 NOTE — Telephone Encounter (Signed)
plz call [name] - this is a difficult situation - as stopping blood thinner increases stroke risk - which he's had in the past, but blood thinner is contributing to blood in foley bag. May need to discuss with family what is preferred route to go.  Any UTI symptoms of dysuria or discomfort with urination, or other urinary changes besides blood?  I never received results of labs drawn 10/27/2017.

## 2017-11-07 NOTE — Telephone Encounter (Signed)
Noted. Will await family decision. Would lean towards stopping eliquis.

## 2017-11-07 NOTE — Telephone Encounter (Signed)
Spoke with Chrislyn relaying message per Dr. Theo Dills understanding. States pt is not having any UTI sxs.  Also, says she will check with [facility] about the labs so Dr. Reece Agar can get those results.

## 2017-11-25 ENCOUNTER — Telehealth: Payer: Self-pay

## 2017-11-25 NOTE — Telephone Encounter (Signed)
I'm just now receiving labs collected 10/27/2017 White count 16, Hgb 10.7, plt 319. Cr 1.09.  If no signs of active infection, no changes for now.  Any more blood in urine?  Has family decided if they want to continue eliquis or stop?

## 2017-11-25 NOTE — Telephone Encounter (Signed)
Received labs from [facility]. Place in Dr. Timoteo Expose box.

## 2017-11-25 NOTE — Telephone Encounter (Signed)
Spoke with [facility] labs asking about recent labs for pt for Dr. Anne Ng they are faxing results now.   Left message on vm updating [name] with the info above.

## 2017-11-25 NOTE — Telephone Encounter (Signed)
Copied from CRM [id number]. Topic: General - Other >> Nov 24, 2017  1:59 PM Marylen Ponto wrote: Reason for CRM: Chrslyn with [facility] [place] states she needs the lab results from labs collected on 11/04/17. Chrslyn stated that she personally took the collection to [facility] on [address]. Cb# [phone]

## 2017-11-28 ENCOUNTER — Encounter: Payer: Self-pay | Admitting: Family Medicine

## 2017-11-28 NOTE — Telephone Encounter (Signed)
Left message on vm for Chrslyn relaying Dr. Timoteo Expose message. Asked her to call back with answers to Dr. Timoteo Expose questions.

## 2017-11-29 ENCOUNTER — Telehealth: Payer: Self-pay | Admitting: Family Medicine

## 2017-11-29 NOTE — Telephone Encounter (Signed)
Is there a message; what is needed?

## 2017-12-10 ENCOUNTER — Other Ambulatory Visit: Payer: Self-pay | Admitting: Family Medicine

## 2017-12-12 NOTE — Telephone Encounter (Signed)
Electronic refill request Digoxin and Metoprolol Last office visit 02/21/17, numerous appointments cancelled. Digoxin last refill 09/20/17 #45 Metoprolol last refill 09/20/17 #45

## 2018-01-10 ENCOUNTER — Emergency Department (HOSPITAL_COMMUNITY)
Admission: EM | Admit: 2018-01-10 | Discharge: 2018-01-10 | Disposition: A | Discharge status: Home / Self Care | Payer: Medicare Other | Attending: Emergency Medicine | Admitting: Emergency Medicine

## 2018-01-10 ENCOUNTER — Encounter (HOSPITAL_COMMUNITY): Payer: Self-pay

## 2018-01-10 ENCOUNTER — Other Ambulatory Visit: Payer: Self-pay

## 2018-01-10 DIAGNOSIS — I130 Hypertensive heart and chronic kidney disease with heart failure and stage 1 through stage 4 chronic kidney disease, or unspecified chronic kidney disease: Secondary | ICD-10-CM | POA: Insufficient documentation

## 2018-01-10 DIAGNOSIS — Z8673 Personal history of transient ischemic attack (TIA), and cerebral infarction without residual deficits: Secondary | ICD-10-CM | POA: Diagnosis not present

## 2018-01-10 DIAGNOSIS — F0390 Unspecified dementia without behavioral disturbance: Secondary | ICD-10-CM | POA: Insufficient documentation

## 2018-01-10 DIAGNOSIS — Z960 Presence of urogenital implants: Secondary | ICD-10-CM | POA: Insufficient documentation

## 2018-01-10 DIAGNOSIS — R410 Disorientation, unspecified: Secondary | ICD-10-CM | POA: Diagnosis not present

## 2018-01-10 DIAGNOSIS — Z79899 Other long term (current) drug therapy: Secondary | ICD-10-CM | POA: Insufficient documentation

## 2018-01-10 DIAGNOSIS — T839XXA Unspecified complication of genitourinary prosthetic device, implant and graft, initial encounter: Secondary | ICD-10-CM | POA: Diagnosis present

## 2018-01-10 DIAGNOSIS — I5032 Chronic diastolic (congestive) heart failure: Secondary | ICD-10-CM | POA: Insufficient documentation

## 2018-01-10 DIAGNOSIS — Z7901 Long term (current) use of anticoagulants: Secondary | ICD-10-CM | POA: Diagnosis not present

## 2018-01-10 DIAGNOSIS — Y732 Prosthetic and other implants, materials and accessory gastroenterology and urology devices associated with adverse incidents: Secondary | ICD-10-CM | POA: Diagnosis not present

## 2018-01-10 DIAGNOSIS — Z86711 Personal history of pulmonary embolism: Secondary | ICD-10-CM | POA: Insufficient documentation

## 2018-01-10 DIAGNOSIS — N183 Chronic kidney disease, stage 3 (moderate): Secondary | ICD-10-CM | POA: Diagnosis not present

## 2018-01-10 DIAGNOSIS — N390 Urinary tract infection, site not specified: Secondary | ICD-10-CM

## 2018-01-10 DIAGNOSIS — T83511A Infection and inflammatory reaction due to indwelling urethral catheter, initial encounter: Secondary | ICD-10-CM

## 2018-01-10 DIAGNOSIS — I2510 Atherosclerotic heart disease of native coronary artery without angina pectoris: Secondary | ICD-10-CM | POA: Insufficient documentation

## 2018-01-10 LAB — URINALYSIS, ROUTINE W REFLEX MICROSCOPIC
Bacteria, UA: NONE SEEN
Bilirubin Urine: NEGATIVE
Glucose, UA: NEGATIVE mg/dL
Ketones, ur: NEGATIVE mg/dL
Nitrite: NEGATIVE
Protein, ur: 100 mg/dL — AB
RBC / HPF: >50 RBC/hpf — ABNORMAL HIGH (ref 0–5)
SPECIFIC GRAVITY, URINE: 1.01 (ref 1.005–1.030)
WBC, UA: >50 WBC/hpf — ABNORMAL HIGH (ref 0–5)
pH: 6 (ref 5.0–8.0)

## 2018-01-10 LAB — COMPREHENSIVE METABOLIC PANEL
ALT: 21 U/L (ref 0–44)
AST: 18 U/L (ref 15–41)
Albumin: 3.2 g/dL — ABNORMAL LOW (ref 3.5–5.0)
Alkaline Phosphatase: 68 U/L (ref 38–126)
Anion gap: 10 (ref 5–15)
BUN: 28 mg/dL — AB (ref 8–23)
CO2: 26 mmol/L (ref 22–32)
Calcium: 10.8 mg/dL — ABNORMAL HIGH (ref 8.9–10.3)
Chloride: 103 mmol/L (ref 98–111)
Creatinine, Ser: 1.24 mg/dL (ref 0.61–1.24)
GFR calc Af Amer: 59 mL/min — ABNORMAL LOW (ref >60)
GFR calc non Af Amer: 51 mL/min — ABNORMAL LOW (ref >60)
Glucose, Bld: 106 mg/dL — ABNORMAL HIGH (ref 70–99)
Potassium: 3.5 mmol/L (ref 3.5–5.1)
Sodium: 139 mmol/L (ref 135–145)
Total Bilirubin: 0.4 mg/dL (ref 0.3–1.2)
Total Protein: 7.2 g/dL (ref 6.5–8.1)

## 2018-01-10 LAB — CBC WITH DIFFERENTIAL/PLATELET
Abs Immature Granulocytes: 0.06 10*3/uL (ref 0.00–0.07)
Basophils Absolute: 0 10*3/uL (ref 0.0–0.1)
Basophils Relative: 0 %
Eosinophils Absolute: 0.4 10*3/uL (ref 0.0–0.5)
Eosinophils Relative: 3 %
HCT: 35.4 % — ABNORMAL LOW (ref 39.0–52.0)
Hemoglobin: 11.2 g/dL — ABNORMAL LOW (ref 13.0–17.0)
Immature Granulocytes: 1 %
Lymphocytes Relative: 11 %
Lymphs Abs: 1.4 10*3/uL (ref 0.7–4.0)
MCH: 30.4 pg (ref 26.0–34.0)
MCHC: 31.6 g/dL (ref 30.0–36.0)
MCV: 95.9 fL (ref 80.0–100.0)
Monocytes Absolute: 1.1 10*3/uL — ABNORMAL HIGH (ref 0.1–1.0)
Monocytes Relative: 9 %
NEUTROS ABS: 9.7 10*3/uL — AB (ref 1.7–7.7)
Neutrophils Relative %: 76 %
Platelets: 272 10*3/uL (ref 150–400)
RBC: 3.69 MIL/uL — ABNORMAL LOW (ref 4.22–5.81)
RDW: 14.3 % (ref 11.5–15.5)
WBC: 12.7 10*3/uL — ABNORMAL HIGH (ref 4.0–10.5)
nRBC: 0 % (ref 0.0–0.2)

## 2018-01-10 MED ORDER — CEPHALEXIN 500 MG PO CAPS: 500.0000 mg | ORAL_CAPSULE | Freq: Three times a day (TID) | ORAL | 0 refills | Status: AC

## 2018-01-10 MED ORDER — CEFTRIAXONE SODIUM 1 G IJ SOLR
1.0000 g | Freq: Once | INTRAMUSCULAR | Status: AC
  Administered 2018-01-10: 1 g via INTRAMUSCULAR
  Filled 2018-01-10: qty 10

## 2018-01-10 MED ORDER — SODIUM CHLORIDE 0.9 % IV SOLN: 1.0000 g | Freq: Once | INTRAVENOUS | Status: DC

## 2018-01-10 MED ORDER — LIDOCAINE HCL URETHRAL/MUCOSAL 2 % EX GEL
1.0000 "application " | Freq: Once | CUTANEOUS | Status: AC
  Administered 2018-01-10: 1 via URETHRAL
  Filled 2018-01-10: qty 5

## 2018-01-10 MED ORDER — LIDOCAINE HCL 1 % IJ SOLN
INTRAMUSCULAR | Status: AC
  Administered 2018-01-10: 20 mL
  Filled 2018-01-10: qty 20

## 2018-01-10 NOTE — ED Notes (Signed)
[facility] arrived for transport 

## 2018-01-10 NOTE — ED Notes (Signed)
[facility] called for transport.  

## 2018-01-10 NOTE — ED Notes (Signed)
Bed: NG29WA15 Expected date:  Expected time:  Means of arrival:  Comments: EMS foley issue- room 15

## 2018-01-10 NOTE — ED Provider Notes (Signed)
[facility]-EMERGENCY DEPT Provider Note   CSN: [id number] Arrival date & time: 01/10/18  1640     History   Chief Complaint No chief complaint on file.   HPI [name] is a 84 y.o. male hx of afib on eliquis, CAD, CKD, dementia, chronic indwelling Foley here presenting with Foley issue.  Patient is living at home and has a hospice nurse that comes and visits him.  He supposed to have his Foley changed today and they had a hard time inserting the Foley.  The Foley also got stuck and they were unable to remove it.  Patient has been slightly more confused in the morning per family. Patient wears oxygen at baseline. Denies any falls.   The history is provided by the patient and the spouse.   Level V caveat- dementia   Past Medical History:  Diagnosis Date  . Atrial fibrillation (HCC)    chronic  . BPH (benign prostatic hyperplasia)   . CAD (coronary artery disease)    s/p MI, unsure when  . Chronic diastolic heart failure (HCC)   . Chronic indwelling Foley catheter   . CKD (chronic kidney disease), stage III (HCC)   . Cognitive impairment    from stroke  . 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 DOE (dyspnea on exertion)   . Gout   . HTN (hypertension)   . Hyperlipidemia     Patient Active Problem List   Diagnosis Date Noted  . Complicated UTI (urinary tract infection) 08/02/2017  . Transaminitis 08/02/2017  . Acute embolic stroke (HCC) 07/25/2017  . Atrial fibrillation (HCC) 07/25/2017  . History of cardioembolic cerebrovascular accident (CVA) 07/25/2017  . Hyperlipidemia 07/25/2017  . HTN (hypertension) 07/25/2017  . CKD (chronic kidney disease), stage III (HCC) 07/25/2017  . Chronic indwelling Foley catheter 07/25/2017  . History of BPH 07/25/2017  . Chronic diastolic heart failure (HCC) 07/25/2017  . ? Mural thrombus of left atrium 07/25/2017  . Gram-negative bacteremia  07/25/2017  . Bilateral pulmonary embolism (HCC) 07/25/2017  . Urinary tract infection associated with indwelling urethral catheter (HCC)   . Sepsis (HCC) 07/13/2017  . Acute lower UTI 07/13/2017  . AKI (acute kidney injury) (HCC) 07/13/2017  . Renal insufficiency 02/27/2017  . Health maintenance examination 10/06/2016  . Primary hyperparathyroidism (HCC) 10/06/2016  . Sacral pressure sore 10/06/2016  . Urinary incontinence 10/06/2016  . Tinea pedis 03/26/2016  . Medicare annual wellness visit, subsequent 10/31/2014  . Advanced care planning/counseling discussion 10/31/2014  . DNR (do not resuscitate) 10/31/2014  . Dyspnea on exertion 09/03/2013  . Encounter for therapeutic drug monitoring 05/04/2013  . Gout 09/01/2012  . Lesion of skin of cheek 06/23/2012  . Cognitive impairment   . Permanent atrial fibrillation 06/08/2010  . Long term (current) use of anticoagulants 06/08/2010  . Right bundle branch block 06/08/2010  . Aphasia, post-stroke 06/08/2010  . Aortic stenosis, mild 06/08/2010  . Chronic diastolic congestive heart failure (HCC) 06/08/2010  . Hypercholesterolemia 06/08/2010  . BPH (benign prostatic hyperplasia) 06/08/2010  . Essential hypertension 06/08/2010    Past Surgical History:  Procedure Laterality Date  . APPENDECTOMY  1994  . CARDIOVASCULAR STRESS TEST  01/11/2008   evidence of an old scar of the septum and a larger apical scar but no reversible ischemia, EF 48%  . CATARACT EXTRACTION Left 2008   [place]  . EXCISION MORTON'S NEUROMA  1995   x 2  . HEMORRHOID SURGERY    .  TONSILLECTOMY  1964  . US ECHOCARDIOGRAPHY  12/19/2007   EF 55-60%, mild aortic stenosis and mild pulmonary hypertension and mild mitral regurgitation        Home Medications    Prior to Admission medications   Medication Sig Start Date End Date Taking? Authorizing Provider  allopurinol (ZYLOPRIM) 100 MG tablet Take 1 tablet (100 mg total) by mouth daily. 02/21/17   Eustaquio Boyden, MD  apixaban (ELIQUIS) 2.5 MG TABS tablet Take 1 tablet (2.5 mg total) by mouth 2 (two) times daily. 10/26/17   Eustaquio Boyden, MD  atorvastatin (LIPITOR) 80 MG tablet Take 1 tablet (80 mg total) by mouth daily at 6 PM. 07/28/17   Ghimire, Werner Lean, MD  digoxin (LANOXIN) 0.125 MG tablet TAKE 1 TABLET (125 MCG TOTAL) BY MOUTH EVERY OTHER DAY. 12/14/17   Eustaquio Boyden, MD  finasteride (PROSCAR) 5 MG tablet Take 5 mg by mouth daily.    [provider]  furosemide (LASIX) 40 MG tablet Take 1 tablet (40 mg total) by mouth daily. 09/20/17   Eustaquio Boyden, MD  hydrocortisone cream 1 % Apply 1 application topically 3 (three) times daily as needed for itching (minor skin irritation). 08/04/17   Mikhail, Nita Sells, DO  metoprolol succinate (TOPROL-XL) 25 MG 24 hr tablet TAKE 1/2 TABLET BY MOUTH ONCE DAILY 12/14/17   Eustaquio Boyden, MD  morphine (ROXANOL) 20 MG/ML concentrated solution Take 0.25 mLs (5 mg total) by mouth every 4 (four) hours as needed for severe pain or shortness of breath. 09/23/17   Eustaquio Boyden, MD  nitroGLYCERIN (NITROSTAT) 0.4 MG SL tablet Place 0.4 mg under the tongue every 5 (five) minutes as needed (for chest pain).     [provider]  Omega-3 Fatty Acids (FISH OIL) 1000 MG CAPS Take 1,000 mg by mouth daily.     [provider]    Family History Family History  Problem Relation Age of Onset  . Cancer Brother        brain  . Cancer Sister        colon  . CAD Sister   . Alcohol abuse Brother     Social History Social History   Tobacco Use  . Smoking status: Former Smoker    Last attempt to quit: 02/08/1950    Years since quitting: 67.9  . Smokeless tobacco: Never Used  Substance Use Topics  . Alcohol use: No  . Drug use: No     Allergies   Benazepril hcl   Review of Systems Review of Systems  Genitourinary: Positive for difficulty urinating.  All other systems reviewed and are negative.    Physical Exam Updated  Vital Signs There were no vitals taken for this visit.  Physical Exam  Constitutional: He is oriented to person, place, and time. He appears well-developed.  Chronically ill, demented   HENT:  Head: Normocephalic.  Mouth/Throat: Oropharynx is clear and moist.  Eyes: Pupils are equal, round, and reactive to light. Conjunctivae and EOM are normal.  Neck: Normal range of motion. Neck supple.  Cardiovascular: Normal rate, regular rhythm and normal heart sounds.  Pulmonary/Chest: Effort normal and breath sounds normal. No stridor. No respiratory distress.  Abdominal: Soft. Bowel sounds are normal. He exhibits no distension. There is no tenderness.  Genitourinary:  Genitourinary Comments: Foley not in place, there is some bloody drainage around the foley   Musculoskeletal: Normal range of motion.  Neurological: He is alert and oriented to person, place, and time.  Demented, nonfocal neuro  exam   Skin: Skin is warm. Capillary refill takes less than 2 seconds.  Psychiatric: He has a normal mood and affect.  Nursing note and vitals reviewed.    ED Treatments / Results  Labs (all labs ordered are listed, but only abnormal results are displayed) Labs Reviewed  CBC WITH DIFFERENTIAL/PLATELET - Abnormal; Notable for the following components:      Result Value   WBC 12.7 (*)    RBC 3.69 (*)    Hemoglobin 11.2 (*)    HCT 35.4 (*)    Neutro Abs 9.7 (*)    Monocytes Absolute 1.1 (*)    All other components within normal limits  COMPREHENSIVE METABOLIC PANEL - Abnormal; Notable for the following components:   Glucose, Bld 106 (*)    BUN 28 (*)    Calcium 10.8 (*)    Albumin 3.2 (*)    GFR calc non Af Amer 51 (*)    GFR calc Af Amer 59 (*)    All other components within normal limits  URINALYSIS, ROUTINE W REFLEX MICROSCOPIC - Abnormal; Notable for the following components:   APPearance CLOUDY (*)    Hgb urine dipstick LARGE (*)    Protein, ur 100 (*)    Leukocytes, UA LARGE (*)     RBC / HPF >50 (*)    WBC, UA >50 (*)    All other components within normal limits  URINE CULTURE    EKG None  Radiology No results found.  Procedures BLADDER CATHETERIZATION Date/Time: 01/10/2018 6:51 PM Performed by: Charlynne Pander, MD Authorized by: Charlynne Pander, MD   Consent:    Consent obtained:  Verbal   Consent given by:  Patient   Risks discussed:  False passage   Alternatives discussed:  No treatment Pre-procedure details:    Procedure purpose:  Diagnostic   Preparation: Patient was prepped and draped in usual sterile fashion   Anesthesia (see MAR for exact dosages):    Anesthesia method:  Topical application   Topical anesthetic:  Lidocaine gel Procedure details:    Provider performed due to:  Complicated insertion   Catheter insertion:  Indwelling   Catheter type:  Coude   Catheter size:  18 Fr   Bladder irrigation: no     Number of attempts:  1   Urine characteristics:  Mildly cloudy Post-procedure details:    Patient tolerance of procedure:  Tolerated well, no immediate complications   (including critical care time)   Medications Ordered in ED Medications  cefTRIAXone (ROCEPHIN) 1 g in sodium chloride 0.9 % 100 mL IVPB (has no administration in time range)  lidocaine (XYLOCAINE) 2 % jelly 1 application (1 application Urethral Given 01/10/18 1715)     Initial Impression / Assessment and Plan / ED Course  I have reviewed the triage vital signs and the nursing notes.  Pertinent labs & imaging results that were available during my care of the patient were reviewed by me and considered in my medical decision making (see chart for details).    [name] is a 84 y.o. male with indwelling foley here with foley catheter issue, some mild confusion. I was able to remove the catheter that was half in. I was able to place a new catheter. There is cloudy drainage. Labs at baseline, UA + UTI vs colonization. Since he has some worsening confusion,  will treat for UTI empirically. Given rocephin, will dc home with keflex.    Final Clinical Impressions(s) / ED Diagnoses  Final diagnoses:  None    ED Discharge Orders    None       Charlynne Pander, MD 01/10/18 [phone]

## 2018-01-10 NOTE — ED Triage Notes (Signed)
Pt BIB EMS from home. Pt is on hospice and hospice nurse came to change catheter today. Nurse was unable to remove the catheter. Pt already had clogged catheter and takes blood thinners so sometimes has problems with bleeding when changing catheter. Pt has abd distention due to retention. Pts family reports that pt has also been confused lately.   166/103 RR 28 HR 66 100%

## 2018-01-10 NOTE — Discharge Instructions (Signed)
Take keflex three times daily for a week for UTI   See your doctor and urologist   Return to ER if you have clogged foley, blood in catheter, fever, lethargy

## 2018-01-13 LAB — URINE CULTURE

## 2018-01-14 ENCOUNTER — Telehealth: Payer: Self-pay

## 2018-01-14 NOTE — Progress Notes (Signed)
ED Antimicrobial Stewardship Positive Culture Follow Up   [name] is an 84 y.o. male who [facility] with a an indwelling foley and trouble reinserting foley and mild confusion.   Recent Results (from the past 720 hour(s))  Urine culture     Status: Abnormal   Collection Time: 01/10/18  5:57 PM  Result Value Ref Range Status   Specimen Description   Final    URINE, RANDOM Performed at [facility], [address] [place], [place] [address]    Special Requests   Final    NONE Performed at [facility], [address] [place], [place] [address]    Culture (A)  Final    80,000 COLONIES/mL METHICILLIN RESISTANT STAPHYLOCOCCUS AUREUS 20,000 COLONIES/mL PSEUDOMONAS AERUGINOSA    Report Status 01/13/2018 FINAL  Final   Organism ID, Bacteria METHICILLIN RESISTANT STAPHYLOCOCCUS AUREUS (A)  Final   Organism ID, Bacteria PSEUDOMONAS AERUGINOSA (A)  Final      Susceptibility   Methicillin resistant staphylococcus aureus - MIC*    CIPROFLOXACIN >=8 RESISTANT Resistant     GENTAMICIN <=0.5 SENSITIVE Sensitive     NITROFURANTOIN <=16 SENSITIVE Sensitive     OXACILLIN >=4 RESISTANT Resistant     TETRACYCLINE <=1 SENSITIVE Sensitive     VANCOMYCIN 1 SENSITIVE Sensitive     TRIMETH/SULFA <=10 SENSITIVE Sensitive     CLINDAMYCIN <=0.25 SENSITIVE Sensitive     RIFAMPIN <=0.5 SENSITIVE Sensitive     Inducible Clindamycin NEGATIVE Sensitive     * 80,000 COLONIES/mL METHICILLIN RESISTANT STAPHYLOCOCCUS AUREUS   Pseudomonas aeruginosa - MIC*    CEFTAZIDIME 4 SENSITIVE Sensitive     CIPROFLOXACIN >=4 RESISTANT Resistant     GENTAMICIN >=16 RESISTANT Resistant     IMIPENEM >=16 RESISTANT Resistant     CEFEPIME 16 INTERMEDIATE Intermediate     * 20,000 COLONIES/mL PSEUDOMONAS AERUGINOSA    [x]  Treated with cephalexin, organism resistant to prescribed antimicrobial   New antibiotic prescription: Bactrim DS 1 tablet  twice daily x 7 days   ED Provider: Michela PitcherMina Fawze, PA-C    Ivan LevelBenjamin Genny Ramsey, PharmD., BCPS Clinical Pharmacist Clinical phone for 01/14/18 until 3:30pm: [phone]

## 2018-01-14 NOTE — Telephone Encounter (Signed)
Post ED Visit - Positive Culture Follow-up: Successful Patient Follow-Up  Culture assessed and recommendations reviewed by:  []  Enzo BiNathan Batchelder, Pharm.D. []  Celedonio MiyamotoJeremy Frens, Pharm.D., BCPS AQ-ID []  Garvin FilaMike Maccia, Pharm.D., BCPS []  Georgina PillionElizabeth Martin, [address]Ph[address]D., BCPS []  La PresaMinh Pham, [address]Ph[address]D., BCPS, AAHIVP []  Estella HuskMichelle Turner, Pharm.D., BCPS, AAHIVP []  Lysle Pearlachel Rumbarger, PharmD, BCPS []  Phillips Climeshuy Dang, PharmD, BCPS []  Agapito GamesAlison Masters, PharmD, BCPS []  Verlan FriendsErin Deja, PharmD [facility] Pharm D Positive urine culture  []  Patient discharged without antimicrobial prescription and treatment is now indicated [x]  Organism is resistant to prescribed ED discharge antimicrobial []  Patient with positive blood cultures  Changes discussed with ED provider: Michela PitcherMina Fawze [facility] New antibiotic prescription Bacrim DS 1 BID x 7 days [facility] [phone]  Contacted patient, date 01/14/18 time 1051   Monish Haliburton, Linnell FullingRose Burnett 01/14/2018, 10:49 AM

## 2018-01-22 ENCOUNTER — Other Ambulatory Visit: Payer: Self-pay | Admitting: Family Medicine

## 2018-01-24 ENCOUNTER — Telehealth: Payer: Self-pay | Admitting: *Deleted

## 2018-01-24 NOTE — Telephone Encounter (Signed)
Spoke to chrslyn who is requesting verbal orders for pt to continue as a hospice pt as he is up for re certification. pls advise

## 2018-01-24 NOTE — Telephone Encounter (Signed)
Agree with this. Thanks.  

## 2018-01-26 NOTE — Telephone Encounter (Signed)
Spoke with Chryslyn, of hospice, informing her Dr. Reece AgarG is giving verbal order to continue hospice for the pt.  Verbalizes understanding.

## 2018-02-06 ENCOUNTER — Other Ambulatory Visit: Payer: Self-pay | Admitting: Family Medicine

## 2018-02-06 NOTE — Telephone Encounter (Signed)
Electronic refill request. Senna Plus Last office visit:   02/21/17  No upcoming appts scheduled Last Filled:    60 tablet 0 01/23/2018  Please advise.

## 2018-02-17 ENCOUNTER — Telehealth: Payer: Self-pay

## 2018-02-17 NOTE — Telephone Encounter (Signed)
[name] with [facility] wants to know if Dr Reece Agar will sign death certificate. Pt passed away on February 21, 2018. [name] request cb ASAP.

## 2018-02-17 NOTE — Telephone Encounter (Signed)
[name] notified as instructed and will bring death certificate to [facility] for signature.

## 2018-02-17 NOTE — Telephone Encounter (Signed)
Left message on vm for Marchelle Folks, of [facility], to call back.  Need to inform her Dr. Reece Agar will sign the death certificate.

## 2018-02-17 NOTE — Telephone Encounter (Signed)
Noted  

## 2018-02-17 NOTE — Telephone Encounter (Signed)
Yes I can. Thank yuo.

## 2018-02-21 NOTE — Telephone Encounter (Signed)
Death certificate filled out.  

## 2018-02-21 NOTE — Telephone Encounter (Signed)
Spoke with wife and expressed my condolences

## 2018-03-11 DEATH — deceased

## 2019-01-09 IMAGING — US US RENAL
1 series · 14 of 25 positions shown · non-contrast
Comparison: None.

CLINICAL DATA: Acute onset of renal insufficiency.

EXAM:
RENAL / URINARY TRACT ULTRASOUND COMPLETE

[Series 1: us renal · 0.23mm/px · 14 of 53 slices shown]
[im 1/53]
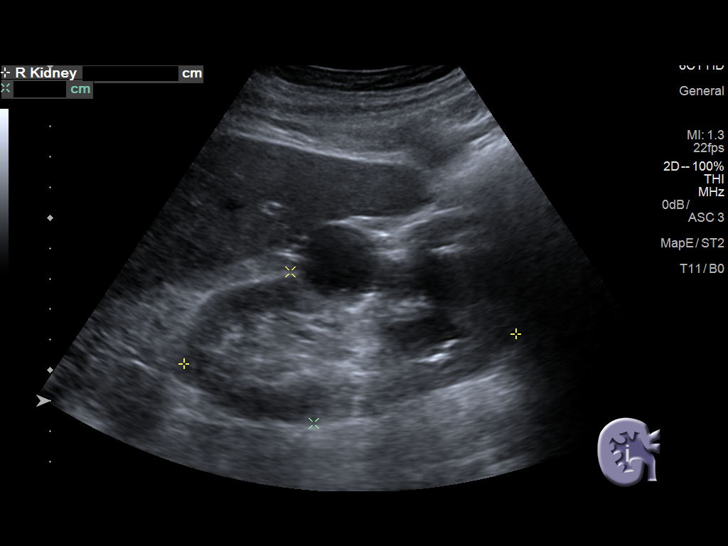
[im 5/53]
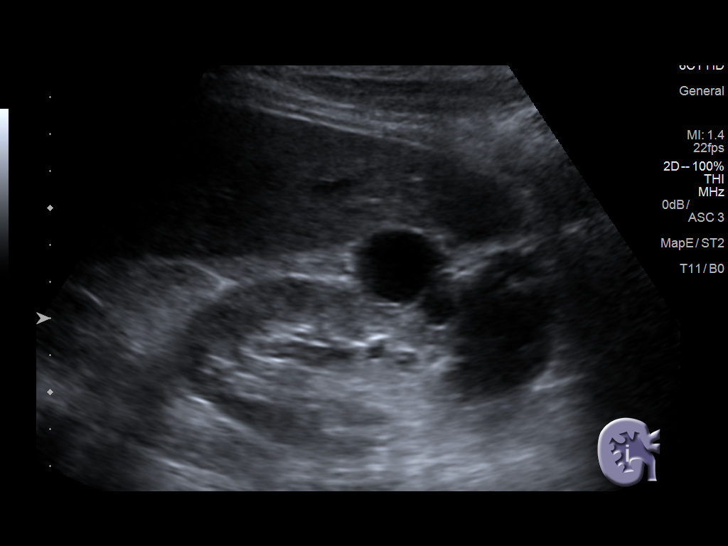
[im 9/53]
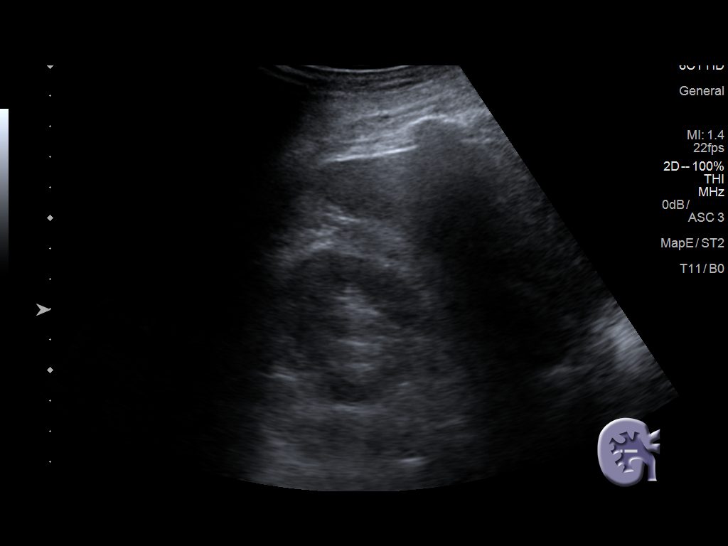
[im 14/53]
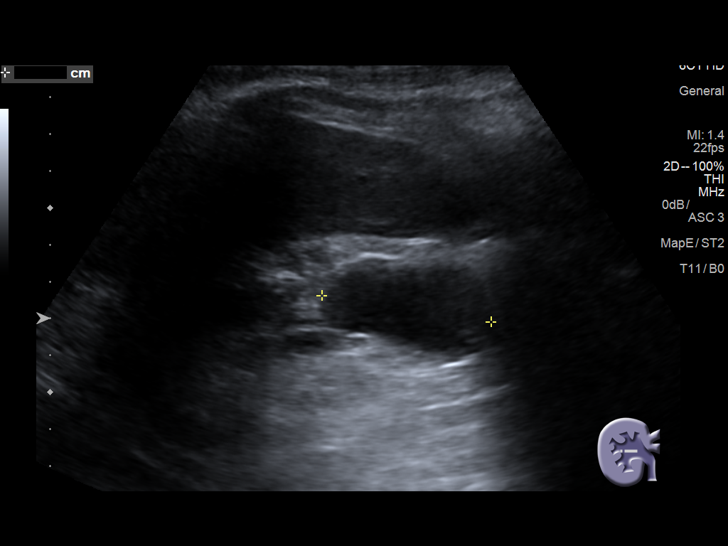
[im 18/53]
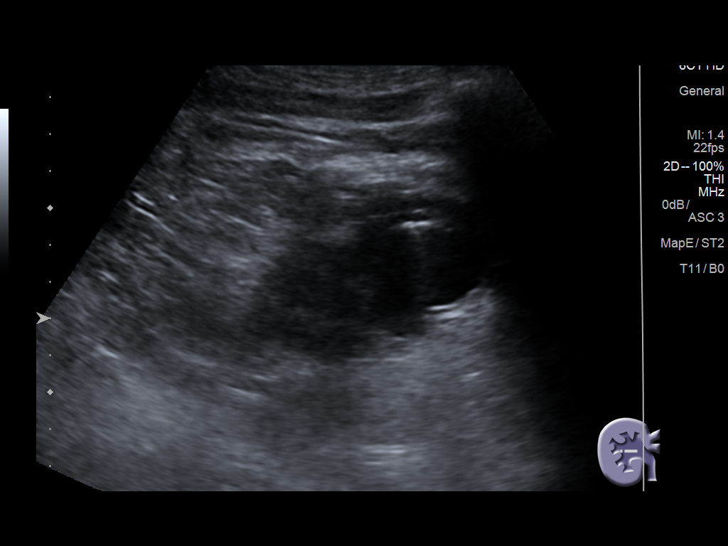
[im 20/53]
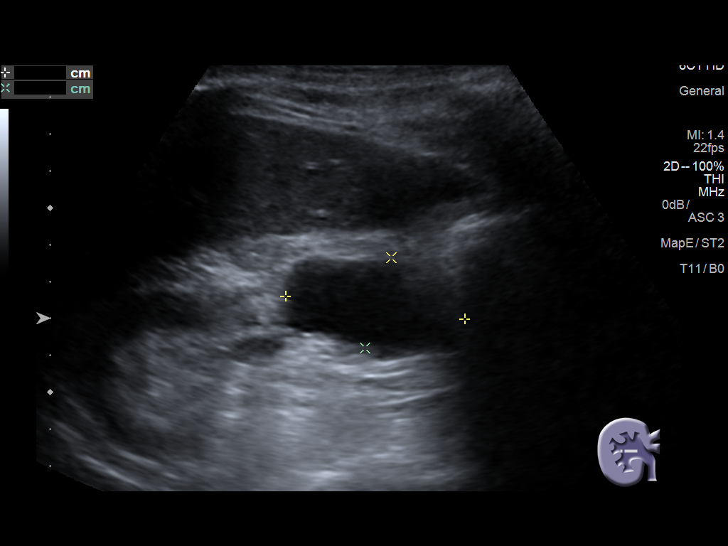
[im 24/53]
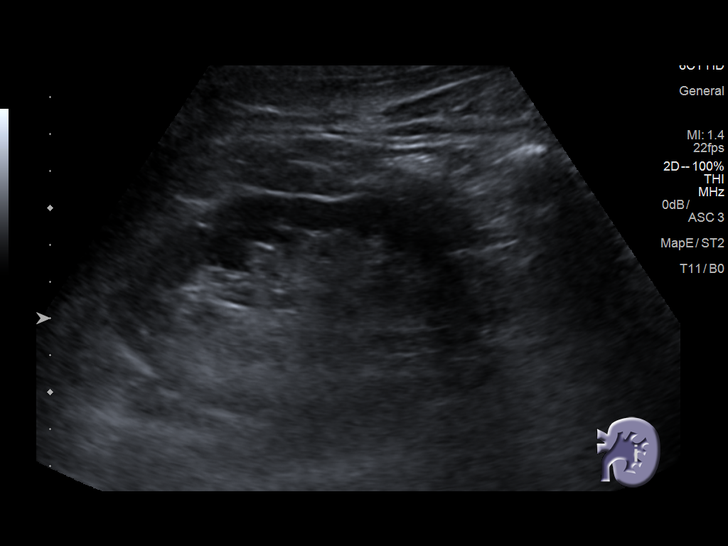
[im 29/53]
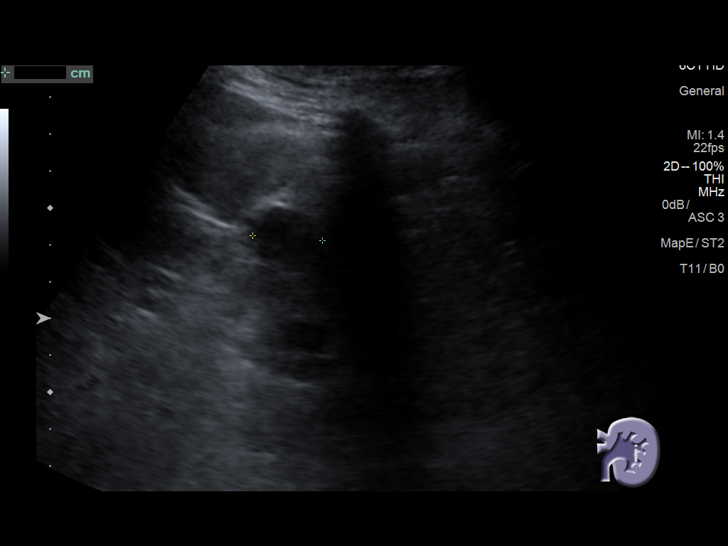
[im 33/53]
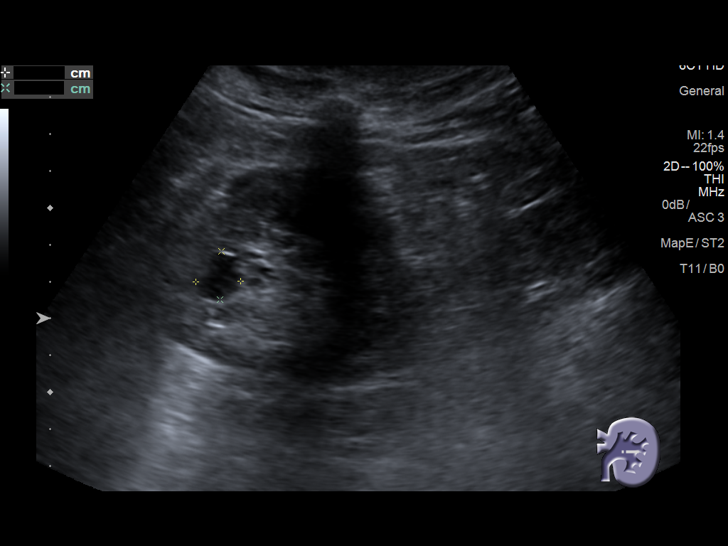
[im 35/53]
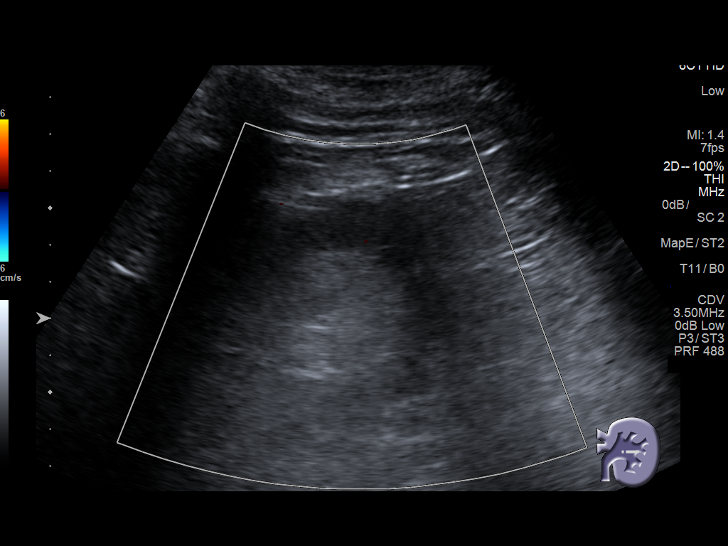
[im 40/53]
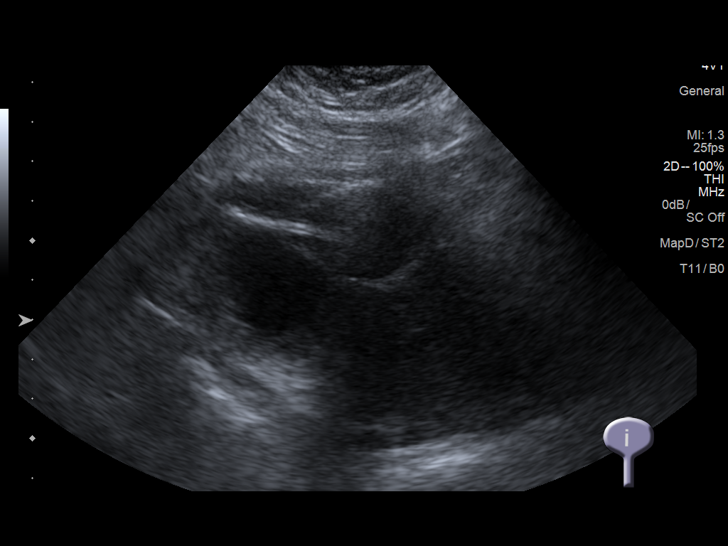
[im 44/53]
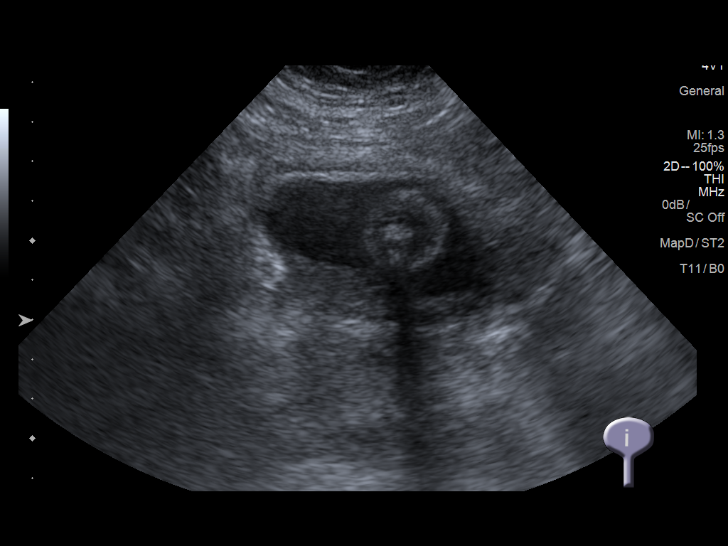
[im 48/53]
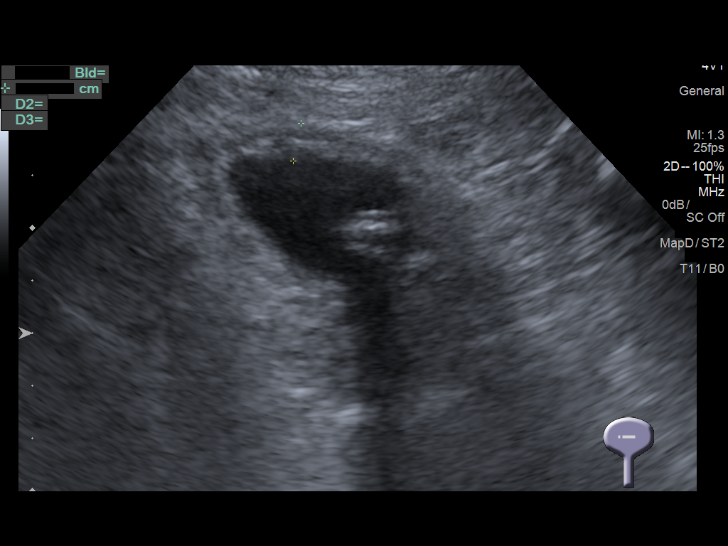
[im 53/53]
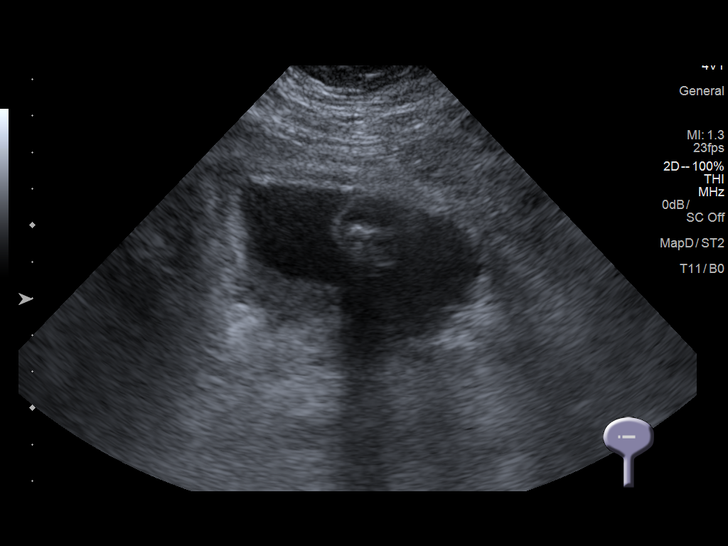

[14 of 25 positions shown; findings below may reference images not displayed]

FINDINGS: Right Kidney:

Length: 10.9 cm. Echogenicity within normal limits. A mildly complex
cystic lesion is noted at the lower pole of the right kidney,
measuring 5.0 x 3.6 x 3.6 cm. This contains a few septations.
Additional cysts are noted at the right kidney, measuring up to
cm in size. No hydronephrosis is seen.

Left Kidney:

Length: 11.3 cm. Echogenicity within normal limits. Left renal cysts
measure up to 1.9 cm in size. No hydronephrosis visualized.

Bladder:

A Foley catheter is noted within the bladder. Debris is seen
dependently within the bladder. Bladder wall thickening may reflect
cystitis or chronic inflammation.
IMPRESSION: 1. Bladder wall thickening may reflect cystitis or chronic
inflammation. Debris noted dependently within the bladder.
2. Mildly complex cystic lesion at the lower pole of the right
kidney, measuring 5.0 cm. This contains a few septations. Would
correlate with any prior available imaging.
3. No evidence of hydronephrosis.
4. Bilateral renal cysts noted.

## 2019-09-02 IMAGING — CT CT HEAD W/O CM
3 of 4 series · 13 of 47 positions shown, 15 images · non-contrast
Comparison: CT HEAD September 07, 2016

CLINICAL DATA: Syncopal episode, loss of consciousness. Recently
discharged from hospital for urinary tract infection. History of
stroke, atrial fibrillation.

EXAM:
CT HEAD WITHOUT CONTRAST
TECHNIQUE: Contiguous axial images were obtained from the base of the skull
through the vertex without intravenous contrast.

[Series 3: head without · axial · non-contrast · 0.45mm/px · z∈[-284,-154]mm · 7 of 36 slices shown, 9 images]
[im 5/36  brain]
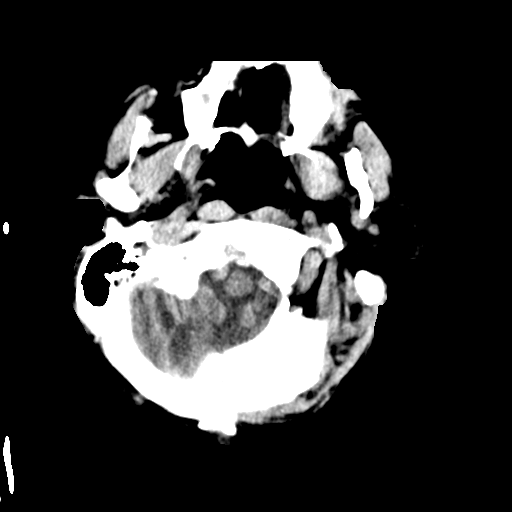
[im 5/36  bone]
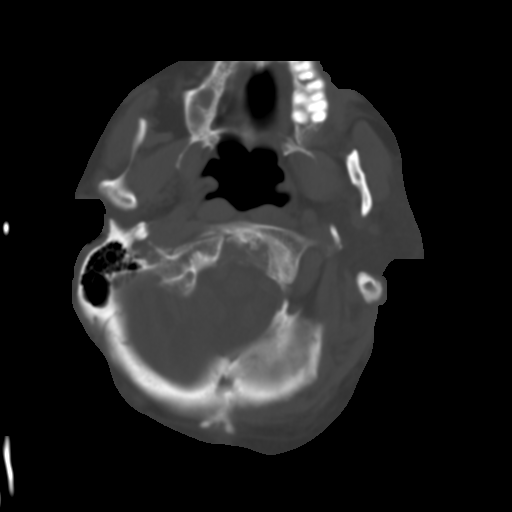
[im 9/36  brain]
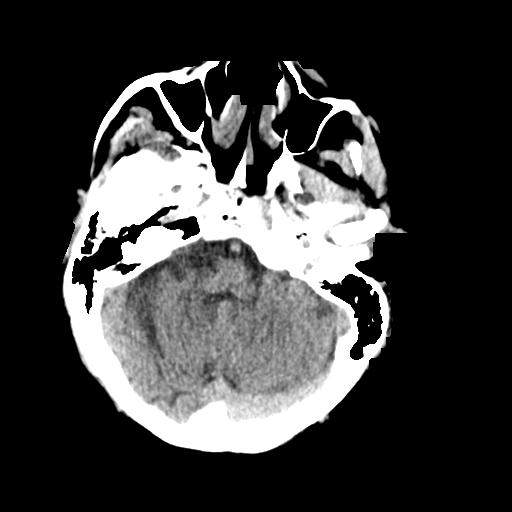
[im 14/36  brain]
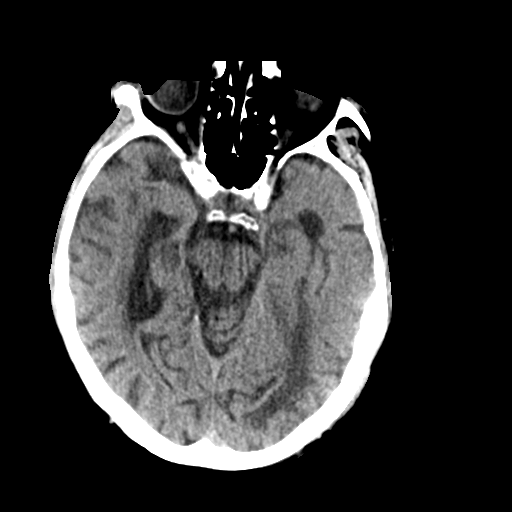
[im 18/36  brain]
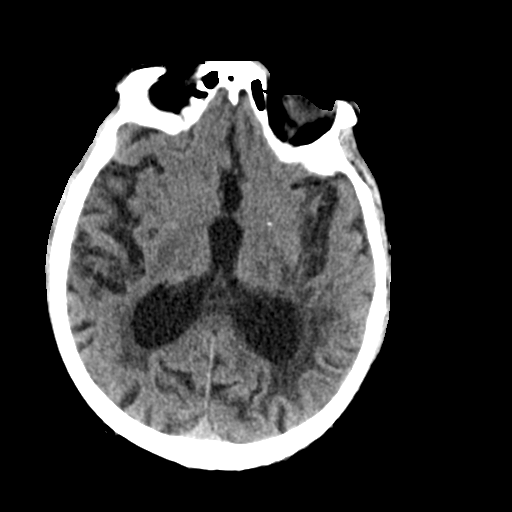
[im 22/36  brain]
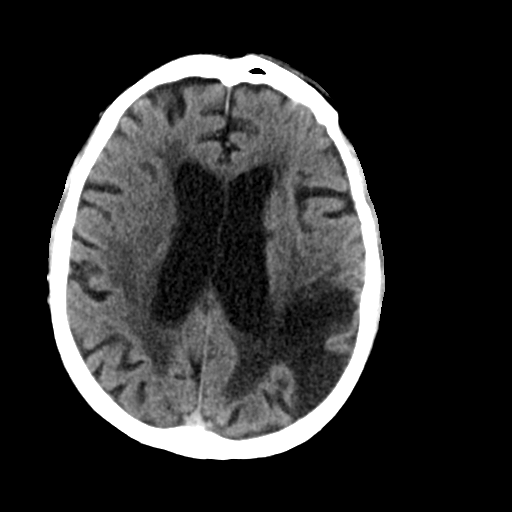
[im 22/36  bone]
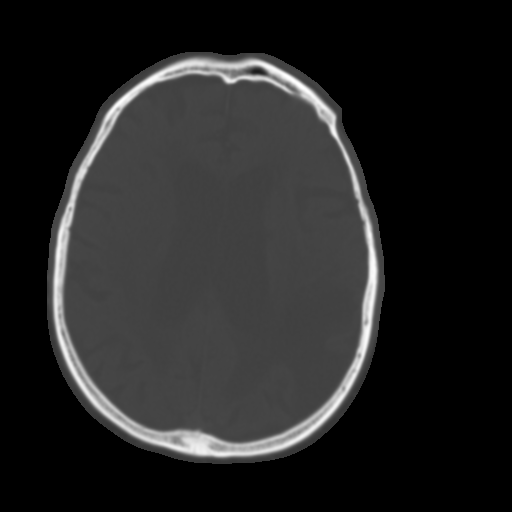
[im 27/36  brain]
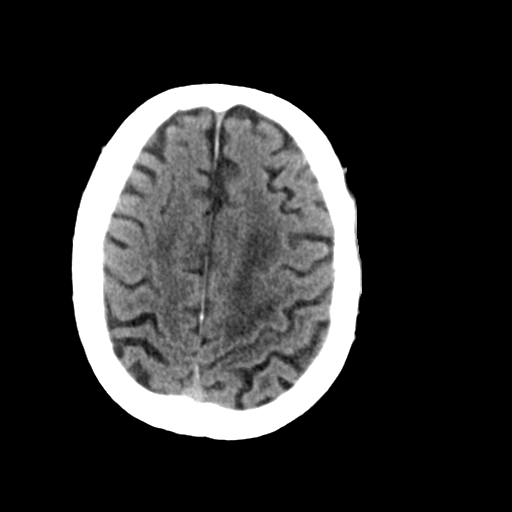
[im 31/36  brain]
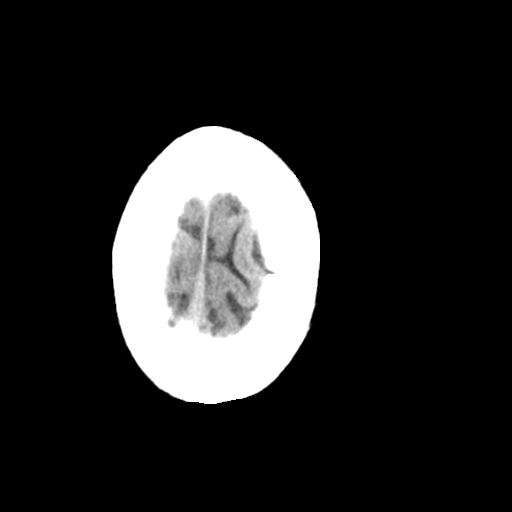

[Series 5: head without cor · coronal · non-contrast · 0.35mm/px · 3 of 75 slices shown]
[im 25/75  brain]
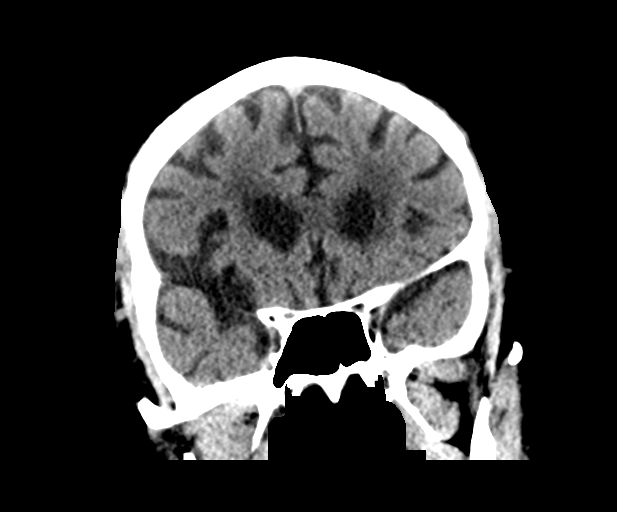
[im 33/75  brain]
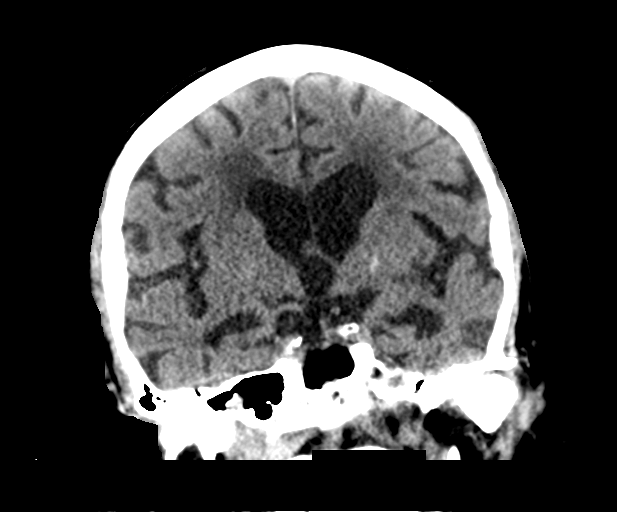
[im 42/75  brain]
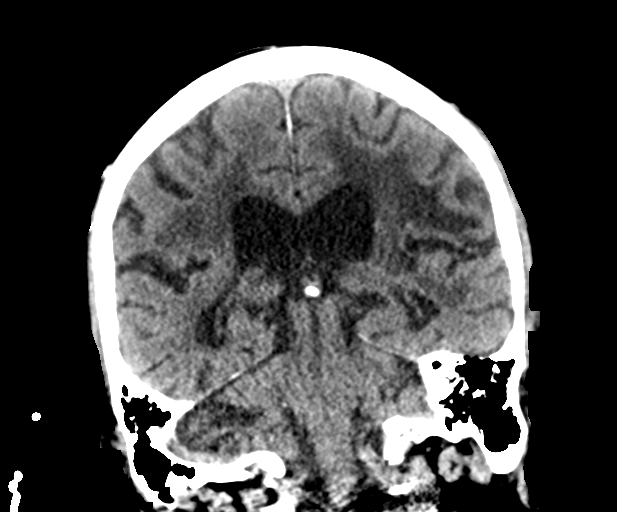

[Series 6: head without sag · sagittal · non-contrast · 0.35mm/px · 3 of 67 slices shown]
[im 23/67  brain]
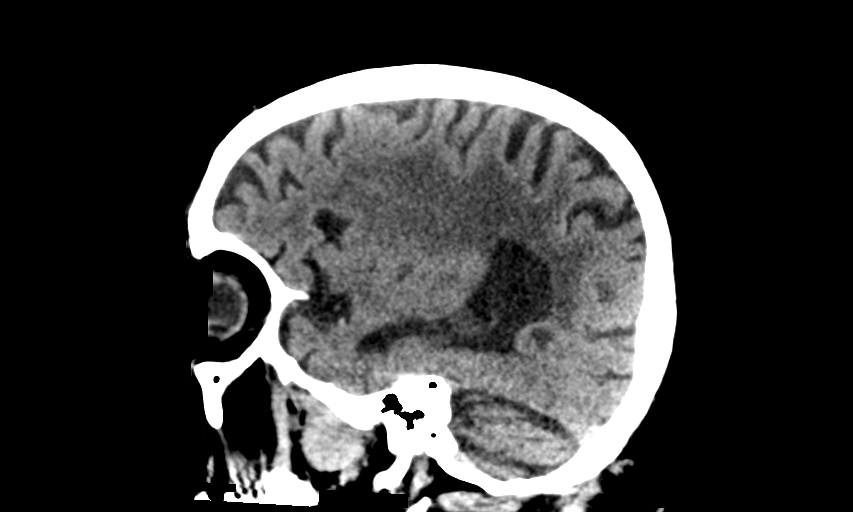
[im 34/67  brain]
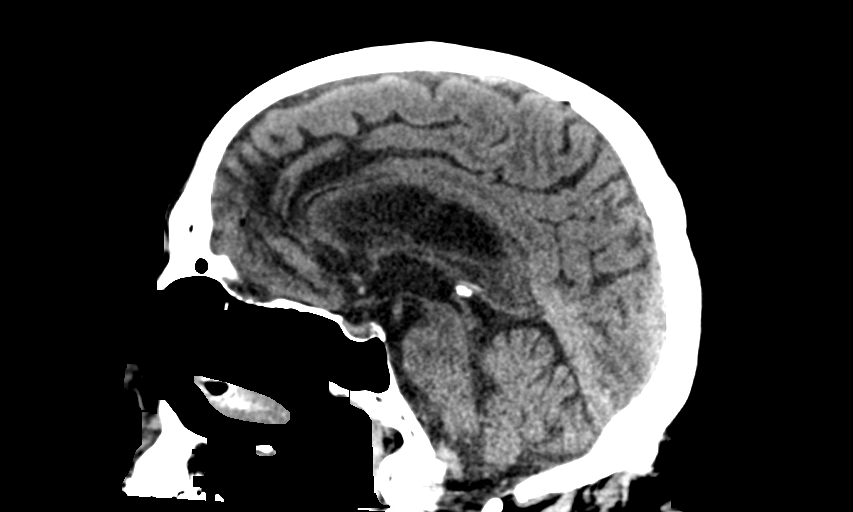
[im 45/67  brain]
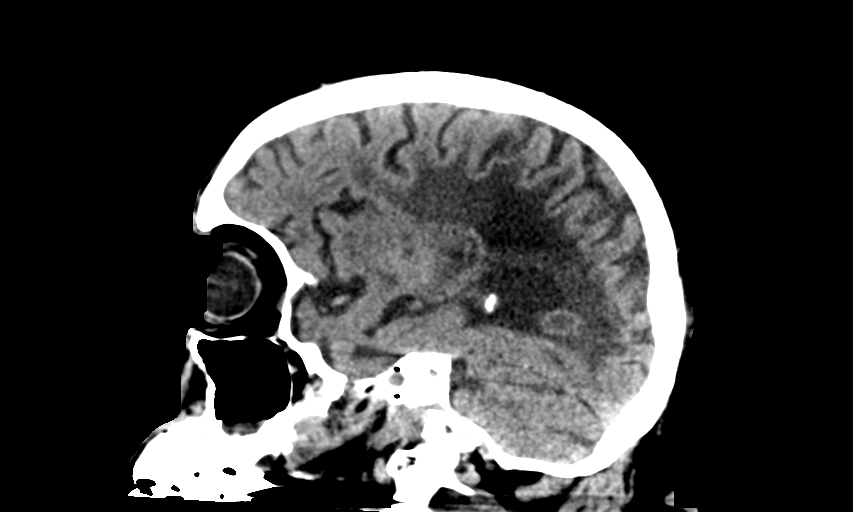

[13 of 47 positions shown; findings below may reference images not displayed]

FINDINGS: BRAIN: No intraparenchymal hemorrhage, mass effect nor midline
shift. Slight blurring of the RIGHT parietal gray-white matter
junction. Smaller RIGHT frontal lobe encephalomalacia. Confluent
LEFT temporoparietal encephalomalacia with ex vacuo dilatation
subjacent ventricle. Moderate to severe parenchymal brain volume
loss. Confluent supratentorial white matter hypodensities. Old RIGHT
cerebellar infarct. Old LEFT basal ganglia infarct. Old small LEFT
occipital lobe infarct. No abnormal extra-axial fluid collections.

VASCULAR: Moderate calcific atherosclerosis of the carotid siphons.

SKULL: No skull fracture. Osteopenia. No significant scalp soft
tissue swelling.

SINUSES/ORBITS: Atretic RIGHT maxillary sinus with bony remodeling
consistent with chronic sinusitis. Mild maxillary sinus mucosal
thickening. Mastoid air cells are well aerated. Status post
bilateral ocular lens implants. Enophthalmos.

OTHER: None.
IMPRESSION: 1. Acute small RIGHT parietal/MCA territory nonhemorrhagic infarct.
Old RIGHT frontal/MCA territory infarct.
2. Old large LEFT MCA territory and small LEFT PCA territory
infarct. Old RIGHT cerebellar infarcts. Old LEFT basal ganglia
infarct.
3. Moderate to severe chronic small vessel ischemic changes.
4. Moderate to severe parenchymal brain volume loss.
5. Acute findings discussed with and reconfirmed by Dr.DALOLT
BLAIN on 07/25/2017 at [DATE].

## 2019-09-02 IMAGING — MR MR HEAD W/O CM
9 of 12 series · 31 of 48 positions shown · non-contrast
Comparison: 07/25/2017 head CT.  03/25/2010 brain MR.

CLINICAL DATA: 89-year-old male post fall. Possible syncope.
Subsequent encounter.

EXAM:
MRI HEAD WITHOUT CONTRAST
TECHNIQUE: Multiplanar, multiecho pulse sequences of the brain and surrounding
structures were obtained without intravenous contrast.

[Series 3: FLAIR · sagittal · 5.0mm · 0.47mm/px · 1 of 23 slices shown (1 of 2)]
[im 1/23]
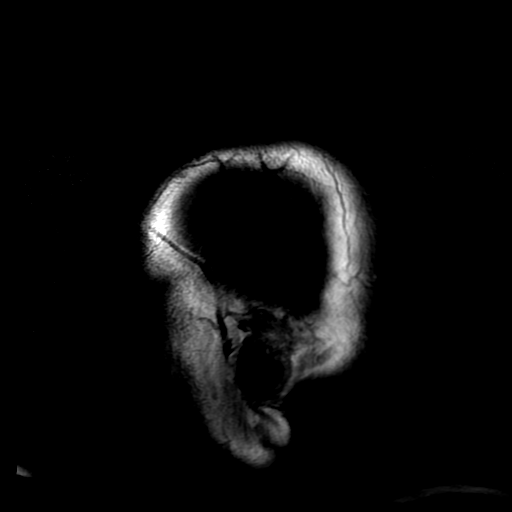

[Series 4: DWI · axial · 3.0mm · 1.02mm/px · z∈[-131,+16]mm · 7 of 100 slices shown (1 of 2)]
[im 1/100]
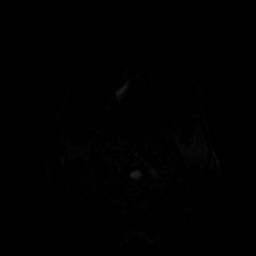
[im 17/100]
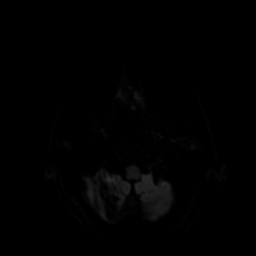
[im 34/100]
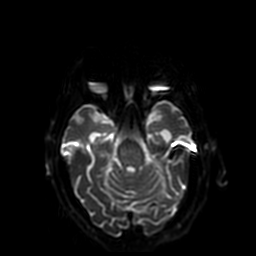
[im 50/100]
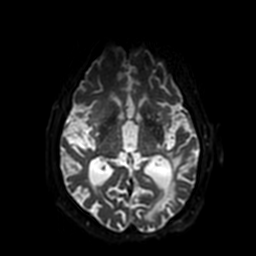
[im 67/100]
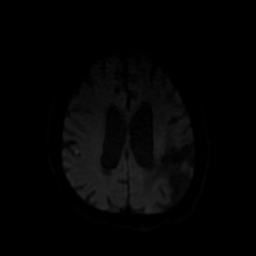
[im 83/100]
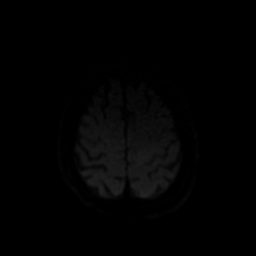
[im 100/100]
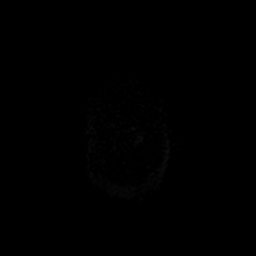

[Series 5: DWI · coronal · 5.0mm · 1.02mm/px · 4 of 68 slices shown (2 of 2)]
[im 1/68]
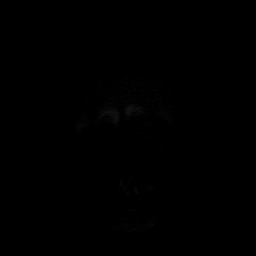
[im 23/68]
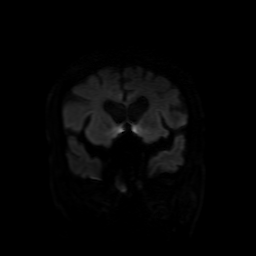
[im 45/68]
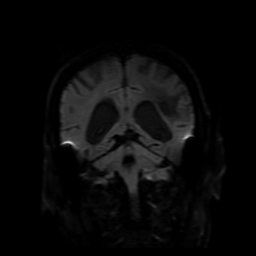
[im 68/68]
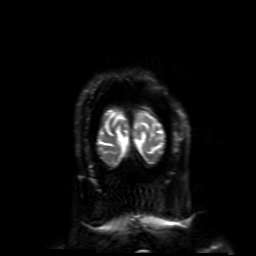

[Series 6: T2 · axial · 5.0mm · 0.43mm/px · z∈[-132,+11]mm · 2 of 25 slices shown (1 of 2)]
[im 1/25]
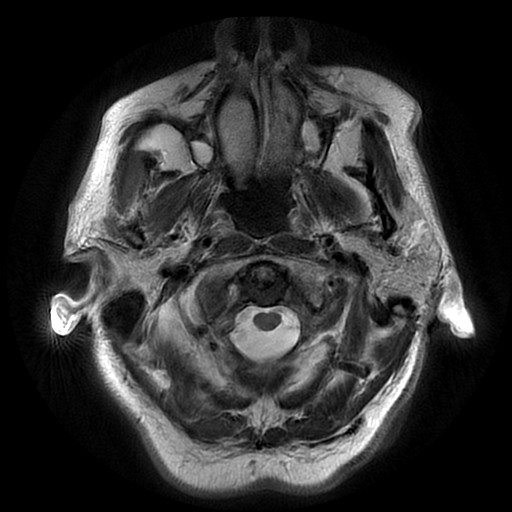
[im 25/25]
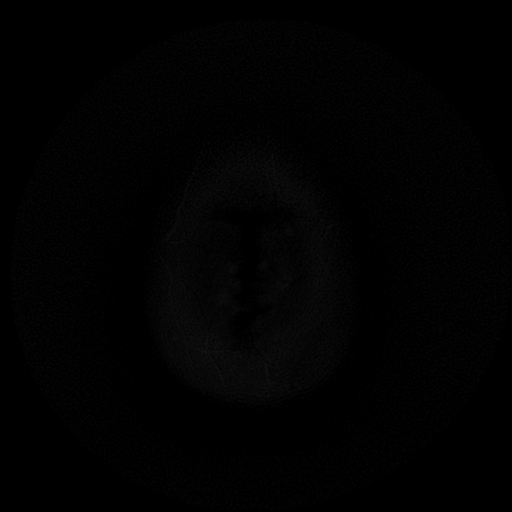

[Series 7: FLAIR · axial · 5.0mm · 0.47mm/px · z∈[-134,+10]mm · 2 of 25 slices shown (2 of 2)]
[im 1/25]
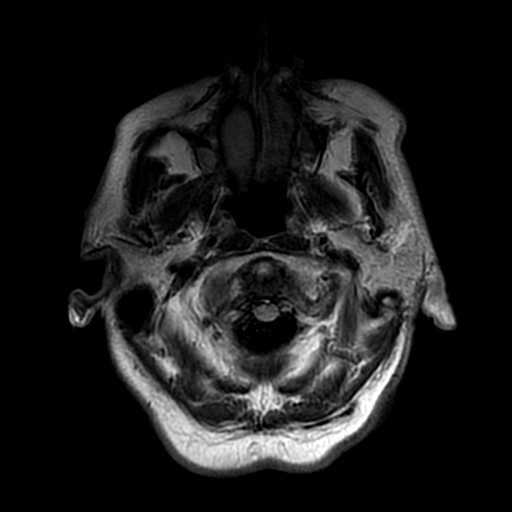
[im 25/25]
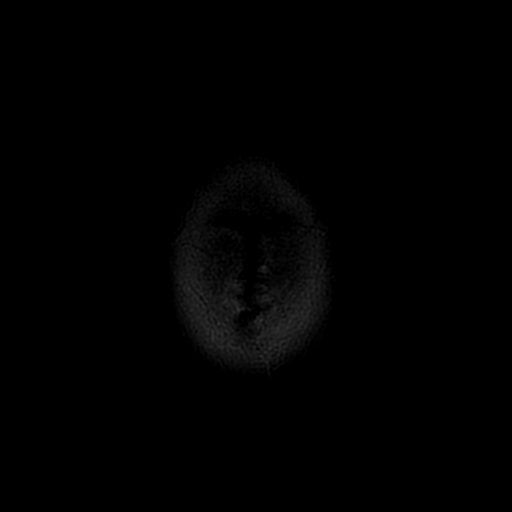

[Series 8: (person_name) · axial · 3.0mm · 0.47mm/px · z∈[-126,-42]mm · 5 of 100 slices shown]
[im 1/100]
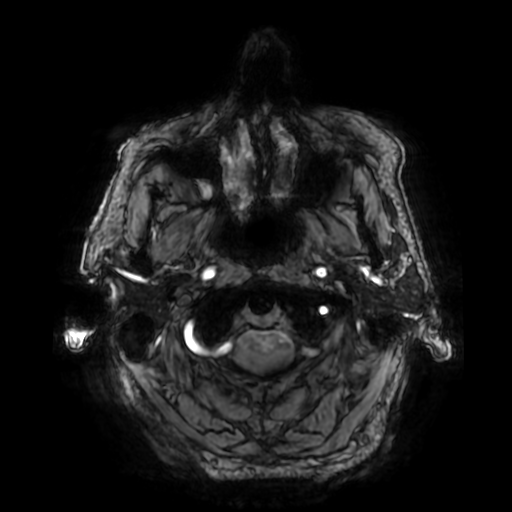
[im 15/100]
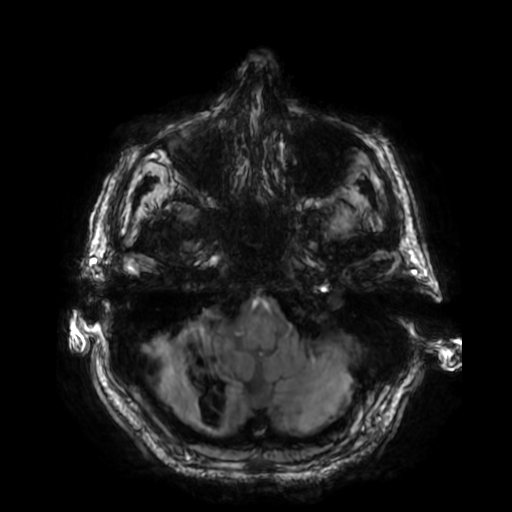
[im 29/100]
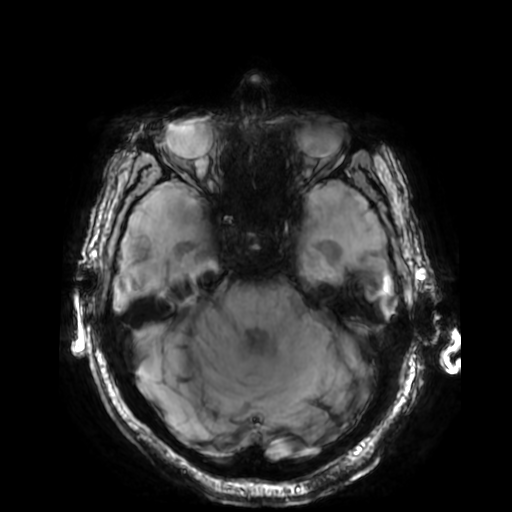
[im 43/100]
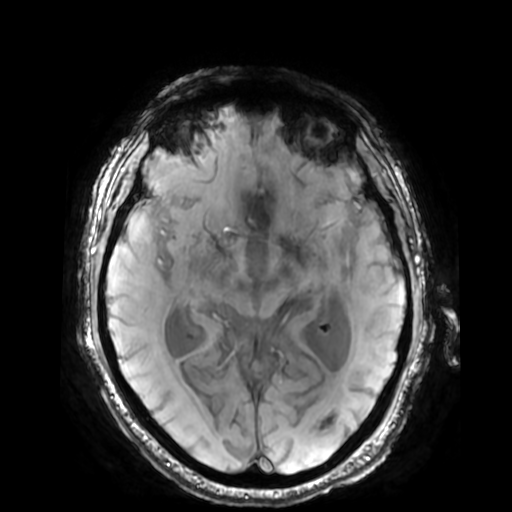
[im 57/100]
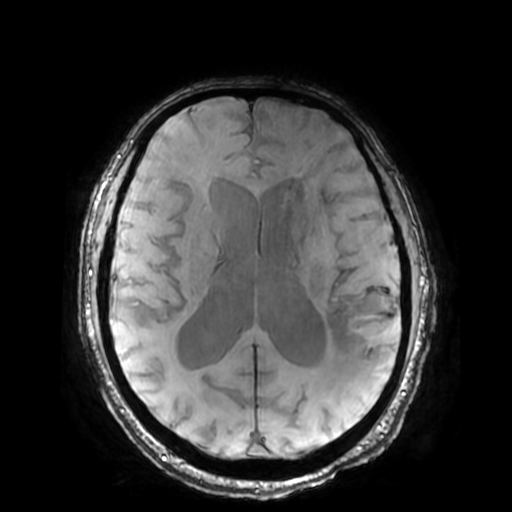

[Series 10: T2 · coronal · 5.0mm · 0.39mm/px · 3 of 34 slices shown (2 of 2)]
[im 1/34]
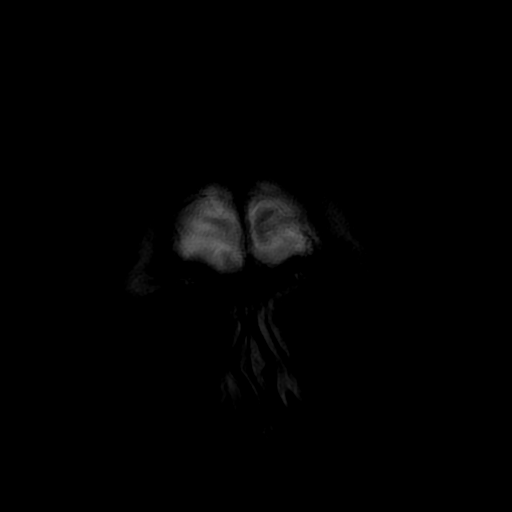
[im 17/34]
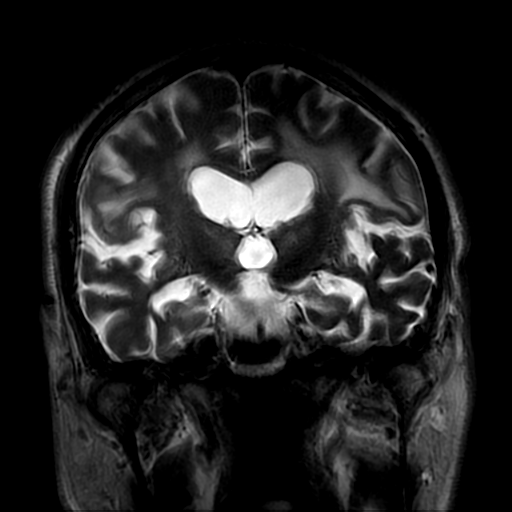
[im 34/34]
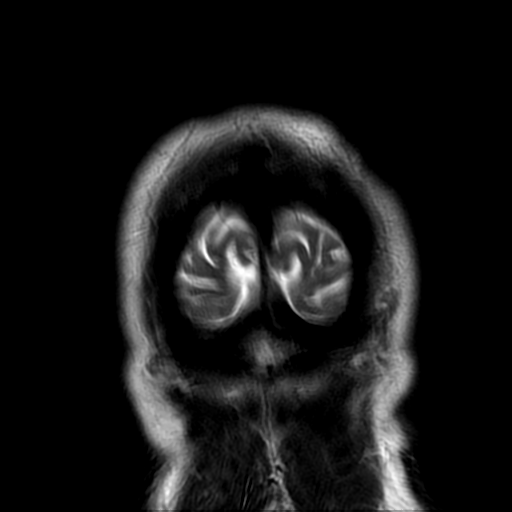

[Series 450: ADC · axial · 3.0mm · 1.02mm/px · z∈[-131,+16]mm · 4 of 50 slices shown (1 of 2)]
[im 1/50]
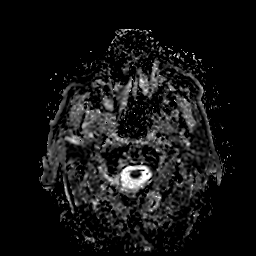
[im 17/50]
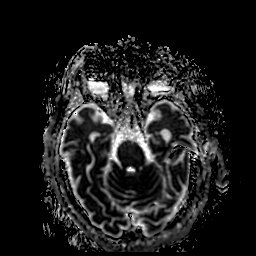
[im 33/50]
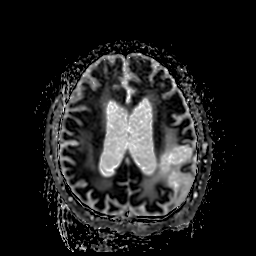
[im 50/50]
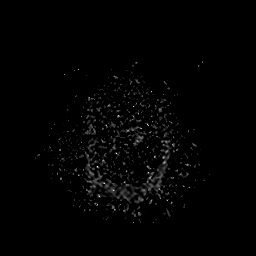

[Series 550: ADC · coronal · 5.0mm · 1.02mm/px · 3 of 34 slices shown (2 of 2)]
[im 1/34]
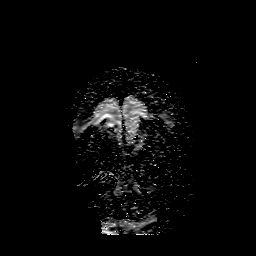
[im 17/34]
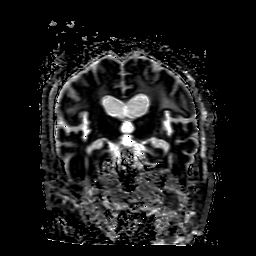
[im 34/34]
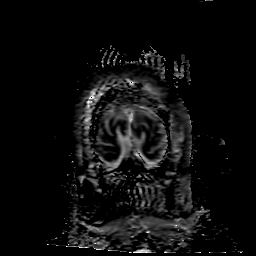

[31 of 48 positions shown; findings below may reference images not displayed]

FINDINGS: Brain: Tiny acute nonhemorrhagic posterior right frontal lobe
infarct.

Moderately large remote left frontal-parietal-posterior temporal
lobe infarct with blood-stained encephalomalacia. Subsequent mild
dilation left lateral ventricle.

Remote posterior right temporal-frontal lobe infarct.

Remote left occipital lobe infarct with blood-stained
encephalomalacia.

Remote inferior right cerebellar infarct with blood-stained
encephalomalacia.

Remote partially hemorrhagic left cerebellar infarct.

Scattered tiny blood breakdown products consistent with areas of
prior hemorrhagic ischemia.

Prominent chronic microvascular changes.

Moderate global atrophy.

No intracranial mass lesion noted on this unenhanced exam.

Vascular: Major intracranial vascular structures are patent.
Atherosclerotic changes suspected without large vessel occlusion.

Skull and upper cervical spine: Motion degraded. Transverse ligament
hypertrophy. Degenerative changes upper cervical spine.

Sinuses/Orbits: Post lens replacement without acute orbital
abnormality. Mild mucosal thickening inferior maxillary sinuses.
Minimal mucosal thickening ethmoid sinus air cells.

Other: Negative.
IMPRESSION: Tiny acute nonhemorrhagic posterior right frontal lobe infarct.

Multiple remote infarcts as detailed above.

Chronic microvascular changes.

Atrophy.
